# Patient Record
Sex: Female | Born: 1950 | Race: White | Hispanic: No | Marital: Single | Smoking: Current every day smoker
Health system: Southern US, Community
[De-identification: ages and names within clinical notes are randomized; demographics above are authoritative.]

## PROBLEM LIST (undated history)

## (undated) DIAGNOSIS — E785 Hyperlipidemia, unspecified: Secondary | ICD-10-CM

## (undated) DIAGNOSIS — E119 Type 2 diabetes mellitus without complications: Secondary | ICD-10-CM

## (undated) DIAGNOSIS — E0590 Thyrotoxicosis, unspecified without thyrotoxic crisis or storm: Secondary | ICD-10-CM

## (undated) DIAGNOSIS — I10 Essential (primary) hypertension: Secondary | ICD-10-CM

## (undated) DIAGNOSIS — N184 Chronic kidney disease, stage 4 (severe): Secondary | ICD-10-CM

## (undated) DIAGNOSIS — F0390 Unspecified dementia without behavioral disturbance: Secondary | ICD-10-CM

## (undated) HISTORY — DX: Type 2 diabetes mellitus without complications: E11.9

## (undated) HISTORY — DX: Hyperlipidemia, unspecified: E78.5

## (undated) HISTORY — DX: Essential (primary) hypertension: I10

## (undated) HISTORY — PX: OTHER SURGICAL HISTORY: SHX169

## (undated) HISTORY — DX: Thyrotoxicosis, unspecified without thyrotoxic crisis or storm: E05.90

---

## 2011-06-08 DIAGNOSIS — B079 Viral wart, unspecified: Secondary | ICD-10-CM | POA: Insufficient documentation

## 2011-06-08 DIAGNOSIS — E119 Type 2 diabetes mellitus without complications: Secondary | ICD-10-CM | POA: Insufficient documentation

## 2012-05-30 ENCOUNTER — Other Ambulatory Visit: Payer: Self-pay | Admitting: Endocrinology

## 2012-05-30 DIAGNOSIS — E041 Nontoxic single thyroid nodule: Secondary | ICD-10-CM

## 2012-06-03 ENCOUNTER — Other Ambulatory Visit: Payer: Self-pay | Admitting: Endocrinology

## 2012-06-03 DIAGNOSIS — E042 Nontoxic multinodular goiter: Secondary | ICD-10-CM

## 2012-06-03 DIAGNOSIS — E041 Nontoxic single thyroid nodule: Secondary | ICD-10-CM

## 2012-06-10 ENCOUNTER — Other Ambulatory Visit: Payer: Self-pay

## 2012-06-12 ENCOUNTER — Ambulatory Visit
Admission: RE | Admit: 2012-06-12 | Discharge: 2012-06-12 | Disposition: A | Discharge status: Home / Self Care | Payer: 59 | Source: Ambulatory Visit | Attending: Endocrinology | Admitting: Endocrinology

## 2012-06-12 ENCOUNTER — Other Ambulatory Visit (HOSPITAL_COMMUNITY)
Admission: RE | Admit: 2012-06-12 | Discharge: 2012-06-12 | Disposition: A | Discharge status: Home / Self Care | Payer: 59 | Source: Ambulatory Visit | Attending: Interventional Radiology | Admitting: Interventional Radiology

## 2012-06-12 DIAGNOSIS — E041 Nontoxic single thyroid nodule: Secondary | ICD-10-CM

## 2012-06-12 DIAGNOSIS — E042 Nontoxic multinodular goiter: Secondary | ICD-10-CM

## 2012-06-12 DIAGNOSIS — E049 Nontoxic goiter, unspecified: Secondary | ICD-10-CM | POA: Insufficient documentation

## 2014-02-16 DIAGNOSIS — F418 Other specified anxiety disorders: Secondary | ICD-10-CM | POA: Insufficient documentation

## 2014-04-21 ENCOUNTER — Encounter (HOSPITAL_BASED_OUTPATIENT_CLINIC_OR_DEPARTMENT_OTHER): Payer: 59 | Attending: General Surgery

## 2014-04-21 DIAGNOSIS — T22019A Burn of unspecified degree of unspecified forearm, initial encounter: Secondary | ICD-10-CM | POA: Insufficient documentation

## 2014-04-21 DIAGNOSIS — IMO0002 Reserved for concepts with insufficient information to code with codable children: Secondary | ICD-10-CM | POA: Insufficient documentation

## 2014-05-07 DIAGNOSIS — T22019A Burn of unspecified degree of unspecified forearm, initial encounter: Secondary | ICD-10-CM | POA: Diagnosis not present

## 2014-07-29 DIAGNOSIS — E119 Type 2 diabetes mellitus without complications: Secondary | ICD-10-CM | POA: Diagnosis not present

## 2014-07-29 DIAGNOSIS — H6002 Abscess of left external ear: Secondary | ICD-10-CM | POA: Diagnosis not present

## 2014-09-22 DIAGNOSIS — E118 Type 2 diabetes mellitus with unspecified complications: Secondary | ICD-10-CM | POA: Diagnosis not present

## 2014-09-24 DIAGNOSIS — E118 Type 2 diabetes mellitus with unspecified complications: Secondary | ICD-10-CM | POA: Diagnosis not present

## 2014-09-24 DIAGNOSIS — Z23 Encounter for immunization: Secondary | ICD-10-CM | POA: Diagnosis not present

## 2014-09-24 DIAGNOSIS — E789 Disorder of lipoprotein metabolism, unspecified: Secondary | ICD-10-CM | POA: Diagnosis not present

## 2014-09-24 DIAGNOSIS — E032 Hypothyroidism due to medicaments and other exogenous substances: Secondary | ICD-10-CM | POA: Diagnosis not present

## 2014-11-16 DIAGNOSIS — E118 Type 2 diabetes mellitus with unspecified complications: Secondary | ICD-10-CM | POA: Diagnosis not present

## 2014-11-25 DIAGNOSIS — E789 Disorder of lipoprotein metabolism, unspecified: Secondary | ICD-10-CM | POA: Diagnosis not present

## 2014-11-25 DIAGNOSIS — E118 Type 2 diabetes mellitus with unspecified complications: Secondary | ICD-10-CM | POA: Diagnosis not present

## 2014-11-25 DIAGNOSIS — E032 Hypothyroidism due to medicaments and other exogenous substances: Secondary | ICD-10-CM | POA: Diagnosis not present

## 2014-11-25 DIAGNOSIS — J019 Acute sinusitis, unspecified: Secondary | ICD-10-CM | POA: Diagnosis not present

## 2014-12-28 DIAGNOSIS — I10 Essential (primary) hypertension: Secondary | ICD-10-CM | POA: Diagnosis not present

## 2014-12-28 DIAGNOSIS — E119 Type 2 diabetes mellitus without complications: Secondary | ICD-10-CM | POA: Diagnosis not present

## 2014-12-28 DIAGNOSIS — S199XXA Unspecified injury of neck, initial encounter: Secondary | ICD-10-CM | POA: Diagnosis not present

## 2014-12-28 DIAGNOSIS — R312 Other microscopic hematuria: Secondary | ICD-10-CM | POA: Diagnosis not present

## 2014-12-28 DIAGNOSIS — S20211A Contusion of right front wall of thorax, initial encounter: Secondary | ICD-10-CM | POA: Diagnosis not present

## 2014-12-28 DIAGNOSIS — E785 Hyperlipidemia, unspecified: Secondary | ICD-10-CM | POA: Diagnosis not present

## 2014-12-28 DIAGNOSIS — S2020XA Contusion of thorax, unspecified, initial encounter: Secondary | ICD-10-CM | POA: Diagnosis not present

## 2014-12-28 DIAGNOSIS — M542 Cervicalgia: Secondary | ICD-10-CM | POA: Diagnosis not present

## 2015-06-09 DIAGNOSIS — E118 Type 2 diabetes mellitus with unspecified complications: Secondary | ICD-10-CM | POA: Diagnosis not present

## 2015-06-09 DIAGNOSIS — E789 Disorder of lipoprotein metabolism, unspecified: Secondary | ICD-10-CM | POA: Diagnosis not present

## 2015-06-16 DIAGNOSIS — E789 Disorder of lipoprotein metabolism, unspecified: Secondary | ICD-10-CM | POA: Diagnosis not present

## 2015-06-16 DIAGNOSIS — E118 Type 2 diabetes mellitus with unspecified complications: Secondary | ICD-10-CM | POA: Diagnosis not present

## 2015-06-16 DIAGNOSIS — E032 Hypothyroidism due to medicaments and other exogenous substances: Secondary | ICD-10-CM | POA: Diagnosis not present

## 2015-08-26 DIAGNOSIS — E032 Hypothyroidism due to medicaments and other exogenous substances: Secondary | ICD-10-CM | POA: Diagnosis not present

## 2015-08-26 DIAGNOSIS — E118 Type 2 diabetes mellitus with unspecified complications: Secondary | ICD-10-CM | POA: Diagnosis not present

## 2015-08-26 DIAGNOSIS — E789 Disorder of lipoprotein metabolism, unspecified: Secondary | ICD-10-CM | POA: Diagnosis not present

## 2015-09-02 DIAGNOSIS — Z23 Encounter for immunization: Secondary | ICD-10-CM | POA: Diagnosis not present

## 2015-09-02 DIAGNOSIS — I10 Essential (primary) hypertension: Secondary | ICD-10-CM | POA: Diagnosis not present

## 2015-09-02 DIAGNOSIS — E118 Type 2 diabetes mellitus with unspecified complications: Secondary | ICD-10-CM | POA: Diagnosis not present

## 2015-09-02 DIAGNOSIS — E0789 Other specified disorders of thyroid: Secondary | ICD-10-CM | POA: Diagnosis not present

## 2015-10-14 DIAGNOSIS — E118 Type 2 diabetes mellitus with unspecified complications: Secondary | ICD-10-CM | POA: Diagnosis not present

## 2015-10-14 DIAGNOSIS — E032 Hypothyroidism due to medicaments and other exogenous substances: Secondary | ICD-10-CM | POA: Diagnosis not present

## 2015-10-21 DIAGNOSIS — G4700 Insomnia, unspecified: Secondary | ICD-10-CM | POA: Diagnosis not present

## 2015-10-21 DIAGNOSIS — E118 Type 2 diabetes mellitus with unspecified complications: Secondary | ICD-10-CM | POA: Diagnosis not present

## 2015-10-21 DIAGNOSIS — E0590 Thyrotoxicosis, unspecified without thyrotoxic crisis or storm: Secondary | ICD-10-CM | POA: Diagnosis not present

## 2015-11-24 DIAGNOSIS — R05 Cough: Secondary | ICD-10-CM | POA: Diagnosis not present

## 2015-11-24 DIAGNOSIS — J069 Acute upper respiratory infection, unspecified: Secondary | ICD-10-CM | POA: Diagnosis not present

## 2015-12-27 DIAGNOSIS — E789 Disorder of lipoprotein metabolism, unspecified: Secondary | ICD-10-CM | POA: Diagnosis not present

## 2015-12-27 DIAGNOSIS — E0500 Thyrotoxicosis with diffuse goiter without thyrotoxic crisis or storm: Secondary | ICD-10-CM | POA: Diagnosis not present

## 2015-12-27 DIAGNOSIS — E118 Type 2 diabetes mellitus with unspecified complications: Secondary | ICD-10-CM | POA: Diagnosis not present

## 2016-02-04 DIAGNOSIS — E109 Type 1 diabetes mellitus without complications: Secondary | ICD-10-CM | POA: Diagnosis not present

## 2016-02-04 DIAGNOSIS — J209 Acute bronchitis, unspecified: Secondary | ICD-10-CM | POA: Diagnosis not present

## 2016-02-04 DIAGNOSIS — R5383 Other fatigue: Secondary | ICD-10-CM | POA: Diagnosis not present

## 2016-02-04 DIAGNOSIS — R05 Cough: Secondary | ICD-10-CM | POA: Diagnosis not present

## 2016-02-11 DIAGNOSIS — J019 Acute sinusitis, unspecified: Secondary | ICD-10-CM | POA: Diagnosis not present

## 2016-02-11 DIAGNOSIS — E109 Type 1 diabetes mellitus without complications: Secondary | ICD-10-CM | POA: Diagnosis not present

## 2016-02-11 DIAGNOSIS — J9801 Acute bronchospasm: Secondary | ICD-10-CM | POA: Diagnosis not present

## 2016-02-11 DIAGNOSIS — J209 Acute bronchitis, unspecified: Secondary | ICD-10-CM | POA: Diagnosis not present

## 2016-03-26 DIAGNOSIS — E0500 Thyrotoxicosis with diffuse goiter without thyrotoxic crisis or storm: Secondary | ICD-10-CM | POA: Diagnosis not present

## 2016-03-26 DIAGNOSIS — E032 Hypothyroidism due to medicaments and other exogenous substances: Secondary | ICD-10-CM | POA: Diagnosis not present

## 2016-03-26 DIAGNOSIS — E118 Type 2 diabetes mellitus with unspecified complications: Secondary | ICD-10-CM | POA: Diagnosis not present

## 2016-03-26 DIAGNOSIS — E789 Disorder of lipoprotein metabolism, unspecified: Secondary | ICD-10-CM | POA: Diagnosis not present

## 2016-03-30 DIAGNOSIS — I10 Essential (primary) hypertension: Secondary | ICD-10-CM | POA: Diagnosis not present

## 2016-03-30 DIAGNOSIS — E118 Type 2 diabetes mellitus with unspecified complications: Secondary | ICD-10-CM | POA: Diagnosis not present

## 2016-03-30 DIAGNOSIS — M810 Age-related osteoporosis without current pathological fracture: Secondary | ICD-10-CM | POA: Diagnosis not present

## 2016-03-30 DIAGNOSIS — E041 Nontoxic single thyroid nodule: Secondary | ICD-10-CM | POA: Diagnosis not present

## 2016-04-04 ENCOUNTER — Other Ambulatory Visit (HOSPITAL_COMMUNITY): Payer: Self-pay | Admitting: Endocrinology

## 2016-04-04 DIAGNOSIS — E041 Nontoxic single thyroid nodule: Secondary | ICD-10-CM

## 2016-04-16 ENCOUNTER — Encounter (HOSPITAL_COMMUNITY)
Admission: RE | Admit: 2016-04-16 | Discharge: 2016-04-16 | Disposition: A | Discharge status: Home / Self Care | Payer: Medicare Other | Source: Ambulatory Visit | Attending: Endocrinology | Admitting: Endocrinology

## 2016-04-16 ENCOUNTER — Encounter (HOSPITAL_COMMUNITY): Payer: 59

## 2016-04-16 ENCOUNTER — Encounter (HOSPITAL_COMMUNITY): Admission: RE | Admit: 2016-04-16 | Payer: 59 | Source: Ambulatory Visit

## 2016-04-16 DIAGNOSIS — E041 Nontoxic single thyroid nodule: Secondary | ICD-10-CM

## 2016-04-16 DIAGNOSIS — E0590 Thyrotoxicosis, unspecified without thyrotoxic crisis or storm: Secondary | ICD-10-CM | POA: Insufficient documentation

## 2016-04-16 MED ORDER — SODIUM IODIDE I 131 CAPSULE
11.4000 | Freq: Once | INTRAVENOUS | Status: AC | PRN
  Administered 2016-04-16: 11.4 via ORAL

## 2016-04-17 ENCOUNTER — Encounter (HOSPITAL_COMMUNITY): Payer: 59

## 2016-04-17 ENCOUNTER — Encounter (HOSPITAL_COMMUNITY)
Admission: RE | Admit: 2016-04-17 | Discharge: 2016-04-17 | Disposition: A | Discharge status: Home / Self Care | Payer: Medicare Other | Source: Ambulatory Visit | Attending: Endocrinology | Admitting: Endocrinology

## 2016-04-17 DIAGNOSIS — E0590 Thyrotoxicosis, unspecified without thyrotoxic crisis or storm: Secondary | ICD-10-CM | POA: Diagnosis not present

## 2016-04-17 DIAGNOSIS — E041 Nontoxic single thyroid nodule: Secondary | ICD-10-CM | POA: Diagnosis not present

## 2016-04-17 MED ORDER — SODIUM PERTECHNETATE TC 99M INJECTION
10.0000 | Freq: Once | INTRAVENOUS | Status: AC | PRN
  Administered 2016-04-17: 10 via INTRAVENOUS

## 2016-05-17 DIAGNOSIS — I10 Essential (primary) hypertension: Secondary | ICD-10-CM | POA: Diagnosis not present

## 2016-05-17 DIAGNOSIS — E032 Hypothyroidism due to medicaments and other exogenous substances: Secondary | ICD-10-CM | POA: Diagnosis not present

## 2016-05-17 DIAGNOSIS — E118 Type 2 diabetes mellitus with unspecified complications: Secondary | ICD-10-CM | POA: Diagnosis not present

## 2016-05-22 ENCOUNTER — Other Ambulatory Visit: Payer: Self-pay

## 2016-05-24 DIAGNOSIS — E041 Nontoxic single thyroid nodule: Secondary | ICD-10-CM | POA: Diagnosis not present

## 2016-05-24 DIAGNOSIS — E0590 Thyrotoxicosis, unspecified without thyrotoxic crisis or storm: Secondary | ICD-10-CM | POA: Diagnosis not present

## 2016-05-24 DIAGNOSIS — E118 Type 2 diabetes mellitus with unspecified complications: Secondary | ICD-10-CM | POA: Diagnosis not present

## 2016-06-29 DIAGNOSIS — E0500 Thyrotoxicosis with diffuse goiter without thyrotoxic crisis or storm: Secondary | ICD-10-CM | POA: Diagnosis not present

## 2016-06-29 DIAGNOSIS — E118 Type 2 diabetes mellitus with unspecified complications: Secondary | ICD-10-CM | POA: Diagnosis not present

## 2016-06-29 DIAGNOSIS — E789 Disorder of lipoprotein metabolism, unspecified: Secondary | ICD-10-CM | POA: Diagnosis not present

## 2016-06-29 DIAGNOSIS — E032 Hypothyroidism due to medicaments and other exogenous substances: Secondary | ICD-10-CM | POA: Diagnosis not present

## 2016-07-05 DIAGNOSIS — E0590 Thyrotoxicosis, unspecified without thyrotoxic crisis or storm: Secondary | ICD-10-CM | POA: Diagnosis not present

## 2016-07-05 DIAGNOSIS — E789 Disorder of lipoprotein metabolism, unspecified: Secondary | ICD-10-CM | POA: Diagnosis not present

## 2016-07-05 DIAGNOSIS — E118 Type 2 diabetes mellitus with unspecified complications: Secondary | ICD-10-CM | POA: Diagnosis not present

## 2016-07-19 DIAGNOSIS — Z23 Encounter for immunization: Secondary | ICD-10-CM | POA: Diagnosis not present

## 2016-09-28 DIAGNOSIS — Z794 Long term (current) use of insulin: Secondary | ICD-10-CM | POA: Diagnosis not present

## 2016-09-28 DIAGNOSIS — J00 Acute nasopharyngitis [common cold]: Secondary | ICD-10-CM | POA: Diagnosis not present

## 2016-09-28 DIAGNOSIS — R05 Cough: Secondary | ICD-10-CM | POA: Diagnosis not present

## 2016-09-28 DIAGNOSIS — I10 Essential (primary) hypertension: Secondary | ICD-10-CM | POA: Insufficient documentation

## 2016-09-28 DIAGNOSIS — E119 Type 2 diabetes mellitus without complications: Secondary | ICD-10-CM | POA: Insufficient documentation

## 2016-12-27 DIAGNOSIS — E0500 Thyrotoxicosis with diffuse goiter without thyrotoxic crisis or storm: Secondary | ICD-10-CM | POA: Diagnosis not present

## 2016-12-27 DIAGNOSIS — E789 Disorder of lipoprotein metabolism, unspecified: Secondary | ICD-10-CM | POA: Diagnosis not present

## 2016-12-27 DIAGNOSIS — E118 Type 2 diabetes mellitus with unspecified complications: Secondary | ICD-10-CM | POA: Diagnosis not present

## 2016-12-27 DIAGNOSIS — E032 Hypothyroidism due to medicaments and other exogenous substances: Secondary | ICD-10-CM | POA: Diagnosis not present

## 2017-01-03 ENCOUNTER — Other Ambulatory Visit: Payer: Self-pay | Admitting: Endocrinology

## 2017-01-03 DIAGNOSIS — R7989 Other specified abnormal findings of blood chemistry: Secondary | ICD-10-CM

## 2017-01-03 DIAGNOSIS — E789 Disorder of lipoprotein metabolism, unspecified: Secondary | ICD-10-CM | POA: Diagnosis not present

## 2017-01-03 DIAGNOSIS — E0500 Thyrotoxicosis with diffuse goiter without thyrotoxic crisis or storm: Secondary | ICD-10-CM | POA: Diagnosis not present

## 2017-01-03 DIAGNOSIS — E109 Type 1 diabetes mellitus without complications: Secondary | ICD-10-CM | POA: Diagnosis not present

## 2017-01-03 DIAGNOSIS — R945 Abnormal results of liver function studies: Secondary | ICD-10-CM

## 2017-01-10 ENCOUNTER — Other Ambulatory Visit: Payer: 59

## 2017-01-17 ENCOUNTER — Other Ambulatory Visit: Payer: 59

## 2017-01-18 ENCOUNTER — Ambulatory Visit
Admission: RE | Admit: 2017-01-18 | Discharge: 2017-01-18 | Disposition: A | Discharge status: Home / Self Care | Payer: Medicare Other | Source: Ambulatory Visit | Attending: Endocrinology | Admitting: Endocrinology

## 2017-01-18 DIAGNOSIS — R945 Abnormal results of liver function studies: Secondary | ICD-10-CM

## 2017-01-18 DIAGNOSIS — R7989 Other specified abnormal findings of blood chemistry: Secondary | ICD-10-CM

## 2017-02-28 DIAGNOSIS — E118 Type 2 diabetes mellitus with unspecified complications: Secondary | ICD-10-CM | POA: Diagnosis not present

## 2017-02-28 DIAGNOSIS — E032 Hypothyroidism due to medicaments and other exogenous substances: Secondary | ICD-10-CM | POA: Diagnosis not present

## 2017-02-28 DIAGNOSIS — I10 Essential (primary) hypertension: Secondary | ICD-10-CM | POA: Diagnosis not present

## 2017-03-07 DIAGNOSIS — E0590 Thyrotoxicosis, unspecified without thyrotoxic crisis or storm: Secondary | ICD-10-CM | POA: Diagnosis not present

## 2017-03-07 DIAGNOSIS — E789 Disorder of lipoprotein metabolism, unspecified: Secondary | ICD-10-CM | POA: Diagnosis not present

## 2017-03-07 DIAGNOSIS — I10 Essential (primary) hypertension: Secondary | ICD-10-CM | POA: Diagnosis not present

## 2017-03-07 DIAGNOSIS — E118 Type 2 diabetes mellitus with unspecified complications: Secondary | ICD-10-CM | POA: Diagnosis not present

## 2017-06-10 DIAGNOSIS — Z0131 Encounter for examination of blood pressure with abnormal findings: Secondary | ICD-10-CM | POA: Diagnosis not present

## 2017-06-10 DIAGNOSIS — J209 Acute bronchitis, unspecified: Secondary | ICD-10-CM | POA: Diagnosis not present

## 2017-06-10 DIAGNOSIS — J019 Acute sinusitis, unspecified: Secondary | ICD-10-CM | POA: Diagnosis not present

## 2017-07-19 DIAGNOSIS — E785 Hyperlipidemia, unspecified: Secondary | ICD-10-CM | POA: Diagnosis not present

## 2017-07-19 DIAGNOSIS — I10 Essential (primary) hypertension: Secondary | ICD-10-CM | POA: Diagnosis not present

## 2017-07-19 DIAGNOSIS — E118 Type 2 diabetes mellitus with unspecified complications: Secondary | ICD-10-CM | POA: Diagnosis not present

## 2017-08-28 DIAGNOSIS — E118 Type 2 diabetes mellitus with unspecified complications: Secondary | ICD-10-CM | POA: Diagnosis not present

## 2017-08-28 DIAGNOSIS — E785 Hyperlipidemia, unspecified: Secondary | ICD-10-CM | POA: Diagnosis not present

## 2017-10-03 DIAGNOSIS — N390 Urinary tract infection, site not specified: Secondary | ICD-10-CM | POA: Diagnosis not present

## 2017-10-03 DIAGNOSIS — E049 Nontoxic goiter, unspecified: Secondary | ICD-10-CM | POA: Diagnosis not present

## 2017-10-03 DIAGNOSIS — E559 Vitamin D deficiency, unspecified: Secondary | ICD-10-CM | POA: Diagnosis not present

## 2017-10-03 DIAGNOSIS — E0590 Thyrotoxicosis, unspecified without thyrotoxic crisis or storm: Secondary | ICD-10-CM | POA: Diagnosis not present

## 2017-10-03 DIAGNOSIS — R292 Abnormal reflex: Secondary | ICD-10-CM | POA: Diagnosis not present

## 2017-10-03 DIAGNOSIS — E118 Type 2 diabetes mellitus with unspecified complications: Secondary | ICD-10-CM | POA: Diagnosis not present

## 2017-10-03 DIAGNOSIS — F17210 Nicotine dependence, cigarettes, uncomplicated: Secondary | ICD-10-CM | POA: Diagnosis not present

## 2017-10-03 DIAGNOSIS — E032 Hypothyroidism due to medicaments and other exogenous substances: Secondary | ICD-10-CM | POA: Diagnosis not present

## 2017-10-03 DIAGNOSIS — Z23 Encounter for immunization: Secondary | ICD-10-CM | POA: Diagnosis not present

## 2017-10-03 DIAGNOSIS — Z801 Family history of malignant neoplasm of trachea, bronchus and lung: Secondary | ICD-10-CM | POA: Diagnosis not present

## 2017-10-03 DIAGNOSIS — M810 Age-related osteoporosis without current pathological fracture: Secondary | ICD-10-CM | POA: Diagnosis not present

## 2017-10-03 DIAGNOSIS — Z1211 Encounter for screening for malignant neoplasm of colon: Secondary | ICD-10-CM | POA: Diagnosis not present

## 2017-10-03 DIAGNOSIS — I8393 Asymptomatic varicose veins of bilateral lower extremities: Secondary | ICD-10-CM | POA: Diagnosis not present

## 2017-10-03 DIAGNOSIS — I4891 Unspecified atrial fibrillation: Secondary | ICD-10-CM | POA: Diagnosis not present

## 2017-10-03 DIAGNOSIS — I10 Essential (primary) hypertension: Secondary | ICD-10-CM | POA: Diagnosis not present

## 2017-10-08 ENCOUNTER — Telehealth: Payer: Self-pay | Admitting: *Deleted

## 2017-10-08 DIAGNOSIS — E118 Type 2 diabetes mellitus with unspecified complications: Secondary | ICD-10-CM | POA: Diagnosis not present

## 2017-10-08 DIAGNOSIS — I10 Essential (primary) hypertension: Secondary | ICD-10-CM | POA: Diagnosis not present

## 2017-10-08 DIAGNOSIS — Z122 Encounter for screening for malignant neoplasm of respiratory organs: Secondary | ICD-10-CM

## 2017-10-08 DIAGNOSIS — E785 Hyperlipidemia, unspecified: Secondary | ICD-10-CM | POA: Diagnosis not present

## 2017-10-08 DIAGNOSIS — M810 Age-related osteoporosis without current pathological fracture: Secondary | ICD-10-CM | POA: Diagnosis not present

## 2017-10-08 DIAGNOSIS — F17210 Nicotine dependence, cigarettes, uncomplicated: Secondary | ICD-10-CM

## 2017-10-08 DIAGNOSIS — I4891 Unspecified atrial fibrillation: Secondary | ICD-10-CM | POA: Diagnosis not present

## 2017-10-10 NOTE — Telephone Encounter (Signed)
Spoke with pt and scheduled SDMV 10/16/17 10:30 CT ordered Nothing further needed

## 2017-10-16 ENCOUNTER — Encounter: Payer: Self-pay | Admitting: Acute Care

## 2017-10-16 ENCOUNTER — Telehealth: Payer: Self-pay | Admitting: Acute Care

## 2017-10-16 ENCOUNTER — Ambulatory Visit (INDEPENDENT_AMBULATORY_CARE_PROVIDER_SITE_OTHER)
Admission: RE | Admit: 2017-10-16 | Discharge: 2017-10-16 | Disposition: A | Discharge status: Home / Self Care | Payer: Medicare Other | Source: Ambulatory Visit | Attending: Acute Care | Admitting: Acute Care

## 2017-10-16 ENCOUNTER — Ambulatory Visit (INDEPENDENT_AMBULATORY_CARE_PROVIDER_SITE_OTHER): Payer: Medicare Other | Admitting: Acute Care

## 2017-10-16 DIAGNOSIS — F17210 Nicotine dependence, cigarettes, uncomplicated: Secondary | ICD-10-CM

## 2017-10-16 DIAGNOSIS — Z122 Encounter for screening for malignant neoplasm of respiratory organs: Secondary | ICD-10-CM

## 2017-10-16 NOTE — Telephone Encounter (Signed)
Received call report from [facility] Radiology on pt's LDCT done today 10/16/17   Impression copied below:  IMPRESSION: 1. Lung-RADS 4AS, suspicious. Follow up low-dose chest CT without contrast in 3 months (please use the following order, "CT CHEST LCS NODULE FOLLOW-UP W/O CM") is recommended. Alternatively, PET may be considered when there is a solid component 37mm or larger. 2. The "S" modifier above refers to potentially clinically significant non lung cancer related findings. Specifically, there is a aortic atherosclerosis, in addition to left main and 2 vessel coronary artery disease. Please note that although the presence of coronary artery calcium documents the presence of coronary artery disease, the severity of this disease and any potential stenosis cannot be assessed on this non-gated CT examination. Assessment for potential risk factor modification, dietary therapy or pharmacologic therapy may be warranted, if clinically indicated. 3. Dilatation of the pulmonic trunk (3.9 cm in diameter), concerning for pulmonary arterial hypertension. 4. Mild diffuse bronchial wall thickening with mild centrilobular and paraseptal emphysema; imaging findings suggestive of underlying COPD.   Judson Roch is aware Phone note routed to [facility]

## 2017-10-16 NOTE — Progress Notes (Signed)
Shared Decision Making Visit Lung Cancer Screening Program [phone])   Eligibility:  Age 67 y.o.  Pack Years Smoking History Calculation 33 pack year history (# packs/per year x # years smoked)  Recent History of coughing up blood  no  Unexplained weight loss? no ( >Than 15 pounds within the last 6 months )  Prior History Lung / other cancer no (Diagnosis within the last 5 years already requiring surveillance chest CT Scans).  Smoking Status Current Smoker  Former Smokers: Years since quit:NA  Quit Date: NA  Visit Components:  Discussion included one or more decision making aids. yes  Discussion included risk/benefits of screening. yes  Discussion included potential follow up diagnostic testing for abnormal scans. yes  Discussion included meaning and risk of over diagnosis. yes  Discussion included meaning and risk of False Positives. yes  Discussion included meaning of total radiation exposure. yes  Counseling Included:  Importance of adherence to annual lung cancer LDCT screening. yes  Impact of comorbidities on ability to participate in the program. yes  Ability and willingness to under diagnostic treatment. yes  Smoking Cessation Counseling:  Current Smokers:   Discussed importance of smoking cessation. yes  Information about tobacco cessation classes and interventions provided to patient. yes  Patient provided with "ticket" for LDCT Scan. yes  Symptomatic Patient. no  Counseling  Diagnosis Code: Tobacco Use Z72.0  Asymptomatic Patient yes  Counseling (Intermediate counseling: > three minutes counseling) S1115  Former Smokers:   Discussed the importance of maintaining cigarette abstinence. yes  Diagnosis Code: Personal History of Nicotine Dependence. Z20.802  Information about tobacco cessation classes and interventions provided to patient. Yes  Patient provided with "ticket" for LDCT Scan. yes  Written Order for Lung Cancer Screening with  LDCT placed in Epic. Yes (CT Chest Lung Cancer Screening Low Dose W/O CM) [id number] Z12.2-Screening of respiratory organs Z87.891-Personal history of nicotine dependence  I have spent 25 minutes of face to face time with Ms. Babin discussing the risks and benefits of lung cancer screening. We viewed a power point together that explained in detail the above noted topics. We paused at intervals to allow for questions to be asked and answered to ensure understanding.We discussed that the single most powerful action that she can take to decrease her risk of developing lung cancer is to quit smoking. We discussed whether or not she is ready to commit to setting a quit date. She is not ready to set a quit date. She does have a prescription for Chantix. We discussed options for tools to aid in quitting smoking including nicotine replacement therapy, non-nicotine medications, support groups, Quit Smart classes, and behavior modification. We discussed that often times setting smaller, more achievable goals, such as eliminating 1 cigarette a day for a week and then 2 cigarettes a day for a week can be helpful in slowly decreasing the number of cigarettes smoked. This allows for a sense of accomplishment as well as providing a clinical benefit. I gave her the " Be Stronger Than Your Excuses" card with contact information for community resources, classes, free nicotine replacement therapy, and access to mobile apps, text messaging, and on-line smoking cessation help. I have also given her my card and contact information in the event she needs to contact me. We discussed the time and location of the scan, and that either Doroteo Glassman RN or I will call with the results within 24-48 hours of receiving them. I have offered her  a copy of the power  point we viewed  as a resource in the event they need reinforcement of the concepts we discussed today in the office. The patient verbalized understanding of all of  the above and  had no further questions upon leaving the office. They have my contact information in the event they have any further questions.  I spent 3-4  minutes counseling on smoking cessation and the health risks of continued tobacco abuse.  I explained to the patient that there has been a high incidence of coronary artery disease noted on these exams. I explained that this is a non-gated exam therefore degree or severity cannot be determined. This patient is on statin therapy. I have asked the patient to follow-up with their PCP regarding any incidental finding of coronary artery disease and management with diet or medication as their PCP  feels is clinically indicated. The patient verbalized understanding of the above and had no further questions upon completion of the visit.      Magdalen Spatz, NP 10/16/2017

## 2017-10-18 NOTE — Telephone Encounter (Signed)
I have called the patient with the results of her LDCT scan. I have explained that her scan was read as a Lung RADS 4 A : suspicious findings, either short term follow up in 3 months or alternatively  PET Scan evaluation may be considered when there is a solid component of  8 mm or larger. I explained that we will do a follow up scan in 3 months ( The end of April 2019). She verbalized understanding. Additionally I have told her we noted what may be pulmonary HTN on the scan. I explained  That we will do a referral to pulmonary for this. She is in agreement.  Langley Gauss, Please place order for follow up CT ( follow up of a 4A) and refer to [facility] for consult for pulmonary HTN. Thanks so much.

## 2017-10-21 DIAGNOSIS — Z1212 Encounter for screening for malignant neoplasm of rectum: Secondary | ICD-10-CM | POA: Diagnosis not present

## 2017-10-21 DIAGNOSIS — Z1211 Encounter for screening for malignant neoplasm of colon: Secondary | ICD-10-CM | POA: Diagnosis not present

## 2017-10-22 NOTE — Telephone Encounter (Signed)
Spoke with pt and scheduled consult with Dr Lake Bells 12/12/17 9:00.  Order placed for 3 mth f/u Chest CT LCS.  Pt verbalized understanding.  Nothing further needed.

## 2017-10-23 DIAGNOSIS — R600 Localized edema: Secondary | ICD-10-CM | POA: Diagnosis not present

## 2017-10-23 DIAGNOSIS — E119 Type 2 diabetes mellitus without complications: Secondary | ICD-10-CM | POA: Diagnosis not present

## 2017-10-23 DIAGNOSIS — Z720 Tobacco use: Secondary | ICD-10-CM | POA: Diagnosis not present

## 2017-10-23 DIAGNOSIS — I481 Persistent atrial fibrillation: Secondary | ICD-10-CM | POA: Diagnosis not present

## 2017-10-23 DIAGNOSIS — Z794 Long term (current) use of insulin: Secondary | ICD-10-CM | POA: Diagnosis not present

## 2017-10-29 DIAGNOSIS — M810 Age-related osteoporosis without current pathological fracture: Secondary | ICD-10-CM | POA: Diagnosis not present

## 2017-10-29 DIAGNOSIS — H35033 Hypertensive retinopathy, bilateral: Secondary | ICD-10-CM | POA: Diagnosis not present

## 2017-10-29 DIAGNOSIS — H25013 Cortical age-related cataract, bilateral: Secondary | ICD-10-CM | POA: Diagnosis not present

## 2017-10-29 DIAGNOSIS — E119 Type 2 diabetes mellitus without complications: Secondary | ICD-10-CM | POA: Diagnosis not present

## 2017-10-29 DIAGNOSIS — E118 Type 2 diabetes mellitus with unspecified complications: Secondary | ICD-10-CM | POA: Diagnosis not present

## 2017-10-29 DIAGNOSIS — E785 Hyperlipidemia, unspecified: Secondary | ICD-10-CM | POA: Diagnosis not present

## 2017-10-29 DIAGNOSIS — I10 Essential (primary) hypertension: Secondary | ICD-10-CM | POA: Diagnosis not present

## 2017-10-29 DIAGNOSIS — H2513 Age-related nuclear cataract, bilateral: Secondary | ICD-10-CM | POA: Diagnosis not present

## 2017-10-29 DIAGNOSIS — I4891 Unspecified atrial fibrillation: Secondary | ICD-10-CM | POA: Diagnosis not present

## 2017-11-05 DIAGNOSIS — R0789 Other chest pain: Secondary | ICD-10-CM | POA: Diagnosis not present

## 2017-11-05 DIAGNOSIS — I481 Persistent atrial fibrillation: Secondary | ICD-10-CM | POA: Diagnosis not present

## 2017-11-20 DIAGNOSIS — F17210 Nicotine dependence, cigarettes, uncomplicated: Secondary | ICD-10-CM | POA: Diagnosis not present

## 2017-11-20 DIAGNOSIS — E118 Type 2 diabetes mellitus with unspecified complications: Secondary | ICD-10-CM | POA: Diagnosis not present

## 2017-11-20 DIAGNOSIS — I119 Hypertensive heart disease without heart failure: Secondary | ICD-10-CM | POA: Diagnosis not present

## 2017-11-26 DIAGNOSIS — M6281 Muscle weakness (generalized): Secondary | ICD-10-CM | POA: Diagnosis not present

## 2017-11-26 DIAGNOSIS — E0590 Thyrotoxicosis, unspecified without thyrotoxic crisis or storm: Secondary | ICD-10-CM | POA: Diagnosis not present

## 2017-11-26 DIAGNOSIS — I4891 Unspecified atrial fibrillation: Secondary | ICD-10-CM | POA: Diagnosis not present

## 2017-11-26 DIAGNOSIS — Z7902 Long term (current) use of antithrombotics/antiplatelets: Secondary | ICD-10-CM | POA: Diagnosis not present

## 2017-11-26 DIAGNOSIS — E1110 Type 2 diabetes mellitus with ketoacidosis without coma: Secondary | ICD-10-CM | POA: Diagnosis not present

## 2017-11-26 DIAGNOSIS — Z794 Long term (current) use of insulin: Secondary | ICD-10-CM | POA: Diagnosis not present

## 2017-11-26 DIAGNOSIS — E860 Dehydration: Secondary | ICD-10-CM | POA: Diagnosis not present

## 2017-11-26 DIAGNOSIS — R5381 Other malaise: Secondary | ICD-10-CM | POA: Diagnosis not present

## 2017-11-26 DIAGNOSIS — E1310 Other specified diabetes mellitus with ketoacidosis without coma: Secondary | ICD-10-CM | POA: Diagnosis not present

## 2017-11-26 DIAGNOSIS — Z79899 Other long term (current) drug therapy: Secondary | ICD-10-CM | POA: Diagnosis not present

## 2017-11-27 ENCOUNTER — Other Ambulatory Visit (HOSPITAL_COMMUNITY): Payer: Self-pay | Admitting: Cardiology

## 2017-11-27 DIAGNOSIS — E1310 Other specified diabetes mellitus with ketoacidosis without coma: Secondary | ICD-10-CM | POA: Diagnosis not present

## 2017-11-27 DIAGNOSIS — E860 Dehydration: Secondary | ICD-10-CM | POA: Diagnosis not present

## 2017-11-27 DIAGNOSIS — I872 Venous insufficiency (chronic) (peripheral): Secondary | ICD-10-CM

## 2017-12-03 DIAGNOSIS — E785 Hyperlipidemia, unspecified: Secondary | ICD-10-CM | POA: Diagnosis not present

## 2017-12-03 DIAGNOSIS — E139 Other specified diabetes mellitus without complications: Secondary | ICD-10-CM | POA: Diagnosis not present

## 2017-12-03 DIAGNOSIS — E0590 Thyrotoxicosis, unspecified without thyrotoxic crisis or storm: Secondary | ICD-10-CM | POA: Diagnosis not present

## 2017-12-03 DIAGNOSIS — R809 Proteinuria, unspecified: Secondary | ICD-10-CM | POA: Diagnosis not present

## 2017-12-03 DIAGNOSIS — E119 Type 2 diabetes mellitus without complications: Secondary | ICD-10-CM | POA: Diagnosis not present

## 2017-12-03 DIAGNOSIS — I10 Essential (primary) hypertension: Secondary | ICD-10-CM | POA: Diagnosis not present

## 2017-12-03 DIAGNOSIS — I4891 Unspecified atrial fibrillation: Secondary | ICD-10-CM | POA: Diagnosis not present

## 2017-12-03 DIAGNOSIS — E138 Other specified diabetes mellitus with unspecified complications: Secondary | ICD-10-CM | POA: Diagnosis not present

## 2017-12-03 DIAGNOSIS — Z6834 Body mass index (BMI) 34.0-34.9, adult: Secondary | ICD-10-CM | POA: Diagnosis not present

## 2017-12-03 DIAGNOSIS — F17210 Nicotine dependence, cigarettes, uncomplicated: Secondary | ICD-10-CM | POA: Diagnosis not present

## 2017-12-09 DIAGNOSIS — R0789 Other chest pain: Secondary | ICD-10-CM | POA: Diagnosis not present

## 2017-12-09 DIAGNOSIS — I4891 Unspecified atrial fibrillation: Secondary | ICD-10-CM | POA: Diagnosis not present

## 2017-12-12 ENCOUNTER — Ambulatory Visit (INDEPENDENT_AMBULATORY_CARE_PROVIDER_SITE_OTHER): Payer: Medicare Other | Admitting: Pulmonary Disease

## 2017-12-12 ENCOUNTER — Encounter: Payer: Self-pay | Admitting: Pulmonary Disease

## 2017-12-12 VITALS — BP 146/92 | HR 100 | Ht 66.5 in | Wt 186.4 lb

## 2017-12-12 DIAGNOSIS — R911 Solitary pulmonary nodule: Secondary | ICD-10-CM

## 2017-12-12 DIAGNOSIS — M810 Age-related osteoporosis without current pathological fracture: Secondary | ICD-10-CM | POA: Diagnosis not present

## 2017-12-12 DIAGNOSIS — F17210 Nicotine dependence, cigarettes, uncomplicated: Secondary | ICD-10-CM | POA: Diagnosis not present

## 2017-12-12 DIAGNOSIS — E139 Other specified diabetes mellitus without complications: Secondary | ICD-10-CM | POA: Diagnosis not present

## 2017-12-12 DIAGNOSIS — E785 Hyperlipidemia, unspecified: Secondary | ICD-10-CM | POA: Diagnosis not present

## 2017-12-12 DIAGNOSIS — I4891 Unspecified atrial fibrillation: Secondary | ICD-10-CM | POA: Diagnosis not present

## 2017-12-12 DIAGNOSIS — I119 Hypertensive heart disease without heart failure: Secondary | ICD-10-CM | POA: Diagnosis not present

## 2017-12-12 DIAGNOSIS — E118 Type 2 diabetes mellitus with unspecified complications: Secondary | ICD-10-CM | POA: Diagnosis not present

## 2017-12-12 DIAGNOSIS — J438 Other emphysema: Secondary | ICD-10-CM | POA: Diagnosis not present

## 2017-12-12 DIAGNOSIS — I10 Essential (primary) hypertension: Secondary | ICD-10-CM | POA: Diagnosis not present

## 2017-12-12 NOTE — Patient Instructions (Signed)
For centrilobular emphysema: Stop smoking Spirometry test today Wash her hands when out in public to prevent infection At this point I see no need for medication given your lack of symptoms If you report shortness of breath, chest tightness or wheezing then let me know and I can prescribe an inhaler  For the pulmonary nodule: Stop smoking Repeat low-dose CT scan in May  For the enlargement of pulmonary arteries on the CT scan: We will assess for COPD today  Cigarette smoking: Stop smoking Use Chantix as directed, be sure to stop the medicine if you have any abnormal thoughts or emotions.  We will see you back in May after the repeat low-dose CT scan

## 2017-12-12 NOTE — Progress Notes (Signed)
Subjective:   PATIENT ID: Carol Valenzuela GENDER: female DOB: 04/09/1951, MRN: [id number]  Synopsis: Referred in March 2019 for an abnormal CT scan of the chest that showed a left lower lobe pulmonary nodule and pulmonary vascular enlargement with mild centrilobular emphysema.[name]  HPI  Chief Complaint  Patient presents with  . Consult    Referred by SG for findings of possible pulm HTN on LDCT.      Carol Valenzuela started going to the doctor again recently and ended up having a lung cancer screening CT scan that was abnormal.  She lives in [place] in [place].  She had been caring for her mother for three years and was smoking and eatin ga lot and gained a lot of weight and felt sick so she went to Dr. Denita Lung office to establish primary care.  She has been diagnosed with diabetes and hypothroidism.   She has smoked cigarettes over the years, she started smoking in college and she says she is now "really old" and still smoking.  So it sounds like she smoked for 40 years.  She smoked mostly around the times of exams and papers when in college then later she smoked more due ot her stressful job, about 1.5 to 2ppd while working.  She is now struggling to quit and feels like her mental health is dependent on her cigarettes now.    She doesn't not any cough or report any shortness of breath.  She has had repeated episodes of severe bronchitis over the last few years which has required antibiotic treatment.  Past Medical History:  Diagnosis Date  . Diabetes ([place])   . Hyperthyroidism      Family History  Problem Relation Age of Onset  . Brain cancer Father   . Lung cancer Father   . Liver cancer Father   . Alcoholism Father      Social History   Socioeconomic History  . Marital status: Single    Spouse name: Not on file  . Number of children: Not on file  . Years of education: Not on file  . Highest education level: Not on file  Occupational History  . Not on file  Social Needs  .  Financial resource strain: Not on file  . Food insecurity:    Worry: Not on file    Inability: Not on file  . Transportation needs:    Medical: Not on file    Non-medical: Not on file  Tobacco Use  . Smoking status: Current Every Day Smoker    Packs/day: 0.75    Years: 44.00    Pack years: 33.00  . Smokeless tobacco: Never Used  . Tobacco comment: down to 0.5ppd  Substance and Sexual Activity  . Alcohol use: Not on file  . Drug use: Not on file  . Sexual activity: Not on file  Lifestyle  . Physical activity:    Days per week: Not on file    Minutes per session: Not on file  . Stress: Not on file  Relationships  . Social connections:    Talks on phone: Not on file    Gets together: Not on file    Attends religious service: Not on file    Active member of club or organization: Not on file    Attends meetings of clubs or organizations: Not on file    Relationship status: Not on file  . Intimate partner violence:    Fear of current or ex partner: Not on  file    Emotionally abused: Not on file    Physically abused: Not on file    Forced sexual activity: Not on file  Other Topics Concern  . Not on file  Social History Narrative  . Not on file     No Known Allergies   Outpatient Medications Prior to Visit  Medication Sig Dispense Refill  . amLODipine (NORVASC) 5 MG tablet Take 5 mg by mouth daily.    [name] apixaban (ELIQUIS) 5 MG TABS tablet Take 5 mg by mouth 2 (two) times daily.    [name] atorvastatin (LIPITOR) 40 MG tablet Take 40 mg by mouth daily.    . calcium-vitamin D (CALCIUM 500+D HIGH POTENCY) 500-400 MG-UNIT tablet Take 1 tablet by mouth daily.    . cholecalciferol (VITAMIN D) 400 units TABS tablet Take 400 Units by mouth daily.    [name] co-enzyme Q-10 30 MG capsule Take 30 mg by mouth daily.    . furosemide (LASIX) 40 MG tablet Take 40 mg by mouth daily.    . insulin aspart (NOVOLOG FLEXPEN) 100 UNIT/ML FlexPen Inject into the skin 3 (three) times daily with meals.    .  insulin detemir (LEVEMIR) 100 UNIT/ML injection Inject 15 Units into the skin daily.    [name] lisinopril (PRINIVIL,ZESTRIL) 40 MG tablet Take 40 mg by mouth daily.    . magnesium oxide (MAG-OX) 400 MG tablet Take 400 mg by mouth daily.    . metFORMIN (GLUCOPHAGE) 500 MG tablet Take 500 mg by mouth daily with breakfast.    . methimazole (TAPAZOLE) 5 MG tablet Take 5 mg by mouth daily.    . metoprolol succinate (TOPROL-XL) 50 MG 24 hr tablet Take 50 mg by mouth daily. Take with or immediately following a meal.    . multivitamin-iron-minerals-folic acid (CENTRUM) chewable tablet Chew 1 tablet by mouth daily.    . Omega-3 Fatty Acids (FISH OIL) 1000 MG CAPS Take 1 capsule by mouth daily.     No facility-administered medications prior to visit.     Review of Systems  Constitutional: Negative for chills, fever, malaise/fatigue and weight loss.  HENT: Negative for congestion, nosebleeds, sinus pain and sore throat.   Eyes: Negative for photophobia, pain and discharge.  Respiratory: Negative for cough, hemoptysis, sputum production, shortness of breath and wheezing.   Cardiovascular: Negative for chest pain, palpitations, orthopnea and leg swelling.  Gastrointestinal: Negative for abdominal pain, constipation, diarrhea, nausea and vomiting.  Genitourinary: Negative for dysuria, frequency, hematuria and urgency.  Musculoskeletal: Negative for back pain, joint pain, myalgias and neck pain.  Skin: Negative for itching and rash.  Neurological: Negative for tingling, tremors, sensory change, speech change, focal weakness, seizures, weakness and headaches.  Psychiatric/Behavioral: Negative for memory loss, substance abuse and suicidal ideas. The patient is not nervous/anxious.       Objective:  Physical Exam   Vitals:   12/12/17 0910  BP: (!) 146/92  Pulse: 100  SpO2: 98%  Weight: 186 lb 6.4 oz (84.6 kg)  Height: 5' 6.5" (1.689 m)   RA  Gen: well appearing, no acute distress HENT: NCAT, OP  clear, neck supple without masses Eyes: PERRL, EOMi Lymph: no cervical lymphadenopathy PULM: CTA B CV: Irreg irreg, no mgr, no JVD GI: BS+, soft, nontender, no hsm Derm: no rash or skin breakdown MSK: normal bulk and tone Neuro: A&Ox4, CN II-XII intact, strength 5/5 in all 4 extremities Psyche: normal mood and affect   CBC No results found for: WBC, RBC, HGB, HCT,  PLT, MCV, MCH, MCHC, RDW, LYMPHSABS, MONOABS, EOSABS, BASOSABS   Chest imaging: January 2019 CT chest images independently reviewed showing a 10 mm nodule in the left lower lobe, pulmonary vascular dilation, centrilobular emphysema, coronary atherosclerosis   PFT:  Labs:  Path:  Echo:  Heart Catheterization:       Assessment & Plan:   Other emphysema ([place]) - Plan: Spirometry with Graph  Cigarette smoker  Pulmonary nodule  Discussion: Carol Valenzuela comes to my clinic today for evaluation of a pulmonary nodule, centrilobular emphysema and findings worrisome for pulmonary hypertension based on a CT scan of her chest.  Objectively she has mild centrilobular emphysema, a pulmonary nodule which is of borderline size and certainly merits close follow-up given her smoking history, and some degree of pulmonary hypertension seen on the CT scan of her chest.  As she reports no symptoms of cough or shortness of breath at this time even if we discover COPD I would not treat it, further given the finding of centrilobular emphysema the pulmonary vascular enlargement is also not terribly worrisome.  Pulmonary vascular hypertension is likely due to centrilobular emphysema, and would not merit pulmonary vascular treatment based on her lack of symptoms.  The pulmonary nodule is of borderline size considering her smoking history.  It needs to be followed closely.  Today we talked for about 5 minutes about the importance of quitting smoking.  We talked about use of Chantix.  She says that though she has struggled with some mild depression  and anxiety in the past she is willing to take the medication.  She is talked to her family members and friends about potential side effects.  She understands the risk of suicidal thoughts or emotional problems while taking the medicines and understands that she would need to stop it if she had this symptom.  Plan: For centrilobular emphysema: Stop smoking Spirometry test today Wash her hands when out in public to prevent infection At this point I see no need for medication given your lack of symptoms If you report shortness of breath, chest tightness or wheezing then let me know and I can prescribe an inhaler  For the pulmonary nodule: Stop smoking Repeat low-dose CT scan in May  For the enlargement of pulmonary arteries on the CT scan: We will assess for COPD today  Cigarette smoking: Stop smoking Use Chantix as directed, be sure to stop the medicine if you have any abnormal thoughts or emotions.  We will see you back in May after the repeat low-dose CT scan    Current Outpatient Medications:  .  amLODipine (NORVASC) 5 MG tablet, Take 5 mg by mouth daily., Disp: , Rfl:  .  apixaban (ELIQUIS) 5 MG TABS tablet, Take 5 mg by mouth 2 (two) times daily., Disp: , Rfl:  .  atorvastatin (LIPITOR) 40 MG tablet, Take 40 mg by mouth daily., Disp: , Rfl:  .  calcium-vitamin D (CALCIUM 500+D HIGH POTENCY) 500-400 MG-UNIT tablet, Take 1 tablet by mouth daily., Disp: , Rfl:  .  cholecalciferol (VITAMIN D) 400 units TABS tablet, Take 400 Units by mouth daily., Disp: , Rfl:  .  co-enzyme Q-10 30 MG capsule, Take 30 mg by mouth daily., Disp: , Rfl:  .  furosemide (LASIX) 40 MG tablet, Take 40 mg by mouth daily., Disp: , Rfl:  .  insulin aspart (NOVOLOG FLEXPEN) 100 UNIT/ML FlexPen, Inject into the skin 3 (three) times daily with meals., Disp: , Rfl:  .  insulin detemir (LEVEMIR) 100  UNIT/ML injection, Inject 15 Units into the skin daily., Disp: , Rfl:  .  lisinopril (PRINIVIL,ZESTRIL) 40 MG  tablet, Take 40 mg by mouth daily., Disp: , Rfl:  .  magnesium oxide (MAG-OX) 400 MG tablet, Take 400 mg by mouth daily., Disp: , Rfl:  .  metFORMIN (GLUCOPHAGE) 500 MG tablet, Take 500 mg by mouth daily with breakfast., Disp: , Rfl:  .  methimazole (TAPAZOLE) 5 MG tablet, Take 5 mg by mouth daily., Disp: , Rfl:  .  metoprolol succinate (TOPROL-XL) 50 MG 24 hr tablet, Take 50 mg by mouth daily. Take with or immediately following a meal., Disp: , Rfl:  .  multivitamin-iron-minerals-folic acid (CENTRUM) chewable tablet, Chew 1 tablet by mouth daily., Disp: , Rfl:  .  Omega-3 Fatty Acids (FISH OIL) 1000 MG CAPS, Take 1 capsule by mouth daily., Disp: , Rfl:

## 2017-12-18 ENCOUNTER — Other Ambulatory Visit: Payer: Medicare Other

## 2017-12-18 ENCOUNTER — Ambulatory Visit
Admission: RE | Admit: 2017-12-18 | Discharge: 2017-12-18 | Disposition: A | Discharge status: Home / Self Care | Payer: Medicare Other | Source: Ambulatory Visit | Attending: Cardiology | Admitting: Cardiology

## 2017-12-18 ENCOUNTER — Other Ambulatory Visit: Payer: 59

## 2017-12-18 DIAGNOSIS — I872 Venous insufficiency (chronic) (peripheral): Secondary | ICD-10-CM | POA: Diagnosis not present

## 2017-12-18 DIAGNOSIS — I83813 Varicose veins of bilateral lower extremities with pain: Secondary | ICD-10-CM | POA: Diagnosis not present

## 2017-12-18 NOTE — Consult Note (Signed)
Chief Complaint: Patient was seen in consultation today for No chief complaint on file.  at the request of Patwardhan,Manish J  Referring Physician(s): Patwardhan,Manish J  History of Present Illness: Carol Valenzuela is a 67 y.o. female who was referred for ankle edema and spider veins. She describes spider veins for several years. She describes slight left lower extremity swelling but relates that to a prior injury. She denies any skin breakdown. She denies any family history of venous insufficiency.  Past Medical History:  Diagnosis Date  . Diabetes ([place])   . Hyperthyroidism     Past Surgical History:  Procedure Laterality Date  . ankle sx Left    broken ankle repair    Allergies: Patient has no known allergies.  Medications: Prior to Admission medications   Medication Sig Start Date End Date Taking? Authorizing Provider  amLODipine (NORVASC) 5 MG tablet Take 5 mg by mouth daily.    [provider]  apixaban (ELIQUIS) 5 MG TABS tablet Take 5 mg by mouth 2 (two) times daily.    [provider]  atorvastatin (LIPITOR) 40 MG tablet Take 40 mg by mouth daily.    [provider]  calcium-vitamin D (CALCIUM 500+D HIGH POTENCY) 500-400 MG-UNIT tablet Take 1 tablet by mouth daily.    [provider]  cholecalciferol (VITAMIN D) 400 units TABS tablet Take 400 Units by mouth daily.    [provider]  co-enzyme Q-10 30 MG capsule Take 30 mg by mouth daily.    [provider]  furosemide (LASIX) 40 MG tablet Take 40 mg by mouth daily.    [provider]  insulin aspart (NOVOLOG FLEXPEN) 100 UNIT/ML FlexPen Inject into the skin 3 (three) times daily with meals.    [provider]  insulin detemir (LEVEMIR) 100 UNIT/ML injection Inject 15 Units into the skin daily.    [provider]  lisinopril (PRINIVIL,ZESTRIL) 40 MG tablet Take 40 mg by mouth daily.    [provider]  magnesium oxide  (MAG-OX) 400 MG tablet Take 400 mg by mouth daily.    [provider]  metFORMIN (GLUCOPHAGE) 500 MG tablet Take 500 mg by mouth daily with breakfast.    [provider]  methimazole (TAPAZOLE) 5 MG tablet Take 5 mg by mouth daily.    [provider]  metoprolol succinate (TOPROL-XL) 50 MG 24 hr tablet Take 50 mg by mouth daily. Take with or immediately following a meal.    [provider]  multivitamin-iron-minerals-folic acid (CENTRUM) chewable tablet Chew 1 tablet by mouth daily.    [provider]  Omega-3 Fatty Acids (FISH OIL) 1000 MG CAPS Take 1 capsule by mouth daily.    [provider]     Family History  Problem Relation Age of Onset  . Brain cancer Father   . Lung cancer Father   . Liver cancer Father   . Alcoholism Father     Social History   Socioeconomic History  . Marital status: Single    Spouse name: Not on file  . Number of children: Not on file  . Years of education: Not on file  . Highest education level: Not on file  Occupational History  . Not on file  Social Needs  . Financial resource strain: Not on file  . Food insecurity:    Worry: Not on file    Inability: Not on file  . Transportation needs:    Medical: Not on file  Non-medical: Not on file  Tobacco Use  . Smoking status: Current Every Day Smoker    Packs/day: 0.75    Years: 44.00    Pack years: 33.00  . Smokeless tobacco: Never Used  . Tobacco comment: down to 0.5ppd  Substance and Sexual Activity  . Alcohol use: Not on file  . Drug use: Not on file  . Sexual activity: Not on file  Lifestyle  . Physical activity:    Days per week: Not on file    Minutes per session: Not on file  . Stress: Not on file  Relationships  . Social connections:    Talks on phone: Not on file    Gets together: Not on file    Attends religious service: Not on file    Active member of club or organization: Not on file    Attends meetings of clubs or  organizations: Not on file    Relationship status: Not on file  Other Topics Concern  . Not on file  Social History Narrative  . Not on file     Review of Systems: A 12 point ROS discussed and pertinent positives are indicated in the HPI above.  All other systems are negative.  Review of Systems  Vital Signs: LMP  (LMP Unknown)   Physical Exam  No evidence of lower extremity edema. Scattered spider veins in the legs are noted. Pulses are intact distally.   Imaging: US Venous Img Lower Bilateral  Result Date: 12/18/2017 CLINICAL DATA:  Spider veins EXAM: BILATERAL LOWER EXTREMITY VENOUS DUPLEX ULTRASOUND TECHNIQUE: Gray-scale sonography with graded compression, as well as color Doppler and duplex ultrasound, were performed to evaluate the deep and superficial veins of both lower extremities. Spectral Doppler was utilized to evaluate flow at rest and with distal augmentation maneuvers. A complete superficial venous insufficiency exam was performed in the upright standing. I personally performed the technical portion of the exam. COMPARISON:  None. FINDINGS: RIGHT Deep Venous System: Evaluation of the deep venous system including the common femoral, femoral, profunda femoral, popliteal and calf veins (where visible) demonstrate no evidence of deep venous thrombosis. The vessels are compressible and demonstrate normal respiratory phasicity and response to augmentation. No evidence of deep venous reflux. RIGHT Superficial Venous System: No reflux LEFT Deep Venous System: Evaluation of the deep venous system including the common femoral, femoral, profunda femoral, popliteal and calf veins (where visible) demonstrate no evidence of deep venous thrombosis. The vessels are compressible and demonstrate normal respiratory phasicity and response to augmentation. No evidence of deep venous reflux. LEFT Superficial Venous System: No reflux IMPRESSION: No evidence of DVT. No evidence of superficial venous  reflux in the lower extremities. Electronically Signed   By: Marybelle Killings M.D.   On: 12/18/2017 14:21    Labs:  CBC: No results for input(s): WBC, HGB, HCT, PLT in the last 8760 hours.  COAGS: No results for input(s): INR, APTT in the last 8760 hours.  BMP: No results for input(s): NA, K, CL, CO2, GLUCOSE, BUN, CALCIUM, CREATININE, GFRNONAA, GFRAA in the last 8760 hours.  Invalid input(s): CMP  LIVER FUNCTION TESTS: No results for input(s): BILITOT, AST, ALT, ALKPHOS, PROT, ALBUMIN in the last 8760 hours.  TUMOR MARKERS: No results for input(s): AFPTM, CEA, CA199, CHROMGRNA in the last 8760 hours.  Assessment and Plan:  There is no evidence of DVT and no evidence of venous insufficiency in the lower extremities. No treatment regarding venous insufficiency is indicated at this time.  Thank you  for this interesting consult.  I greatly enjoyed meeting Carol Valenzuela and look forward to participating in their care.  A copy of this report was sent to the requesting provider on this date.  Electronically Signed: Layah Skousen, ART A 12/18/2017, 2:24 PM   I spent a total of  40 Minutes   in face to face in clinical consultation, greater than 50% of which was counseling/coordinating care for lower extremity venous insufficiency.

## 2017-12-23 DIAGNOSIS — I10 Essential (primary) hypertension: Secondary | ICD-10-CM | POA: Diagnosis not present

## 2017-12-23 DIAGNOSIS — E119 Type 2 diabetes mellitus without complications: Secondary | ICD-10-CM | POA: Diagnosis not present

## 2017-12-23 DIAGNOSIS — Z794 Long term (current) use of insulin: Secondary | ICD-10-CM | POA: Diagnosis not present

## 2017-12-23 DIAGNOSIS — I481 Persistent atrial fibrillation: Secondary | ICD-10-CM | POA: Diagnosis not present

## 2017-12-26 DIAGNOSIS — I119 Hypertensive heart disease without heart failure: Secondary | ICD-10-CM | POA: Diagnosis not present

## 2017-12-26 DIAGNOSIS — I10 Essential (primary) hypertension: Secondary | ICD-10-CM | POA: Diagnosis not present

## 2017-12-26 DIAGNOSIS — E118 Type 2 diabetes mellitus with unspecified complications: Secondary | ICD-10-CM | POA: Diagnosis not present

## 2017-12-31 DIAGNOSIS — E118 Type 2 diabetes mellitus with unspecified complications: Secondary | ICD-10-CM | POA: Diagnosis not present

## 2017-12-31 DIAGNOSIS — I10 Essential (primary) hypertension: Secondary | ICD-10-CM | POA: Diagnosis not present

## 2017-12-31 DIAGNOSIS — E139 Other specified diabetes mellitus without complications: Secondary | ICD-10-CM | POA: Diagnosis not present

## 2017-12-31 DIAGNOSIS — R809 Proteinuria, unspecified: Secondary | ICD-10-CM | POA: Diagnosis not present

## 2017-12-31 DIAGNOSIS — E0590 Thyrotoxicosis, unspecified without thyrotoxic crisis or storm: Secondary | ICD-10-CM | POA: Diagnosis not present

## 2017-12-31 DIAGNOSIS — I4891 Unspecified atrial fibrillation: Secondary | ICD-10-CM | POA: Diagnosis not present

## 2017-12-31 DIAGNOSIS — Z6834 Body mass index (BMI) 34.0-34.9, adult: Secondary | ICD-10-CM | POA: Diagnosis not present

## 2017-12-31 DIAGNOSIS — F17210 Nicotine dependence, cigarettes, uncomplicated: Secondary | ICD-10-CM | POA: Diagnosis not present

## 2017-12-31 DIAGNOSIS — E785 Hyperlipidemia, unspecified: Secondary | ICD-10-CM | POA: Diagnosis not present

## 2018-01-15 ENCOUNTER — Ambulatory Visit: Payer: 59 | Admitting: *Deleted

## 2018-01-20 ENCOUNTER — Encounter

## 2018-01-20 DIAGNOSIS — E0590 Thyrotoxicosis, unspecified without thyrotoxic crisis or storm: Secondary | ICD-10-CM | POA: Diagnosis not present

## 2018-01-20 DIAGNOSIS — Z6834 Body mass index (BMI) 34.0-34.9, adult: Secondary | ICD-10-CM | POA: Diagnosis not present

## 2018-01-20 DIAGNOSIS — F17210 Nicotine dependence, cigarettes, uncomplicated: Secondary | ICD-10-CM | POA: Diagnosis not present

## 2018-01-20 DIAGNOSIS — E785 Hyperlipidemia, unspecified: Secondary | ICD-10-CM | POA: Diagnosis not present

## 2018-01-20 DIAGNOSIS — I4891 Unspecified atrial fibrillation: Secondary | ICD-10-CM | POA: Diagnosis not present

## 2018-01-20 DIAGNOSIS — R809 Proteinuria, unspecified: Secondary | ICD-10-CM | POA: Diagnosis not present

## 2018-01-20 DIAGNOSIS — E119 Type 2 diabetes mellitus without complications: Secondary | ICD-10-CM | POA: Diagnosis not present

## 2018-01-20 DIAGNOSIS — I10 Essential (primary) hypertension: Secondary | ICD-10-CM | POA: Diagnosis not present

## 2018-01-20 DIAGNOSIS — E139 Other specified diabetes mellitus without complications: Secondary | ICD-10-CM | POA: Diagnosis not present

## 2018-01-22 ENCOUNTER — Encounter: Payer: Medicare Other | Attending: Internal Medicine | Admitting: *Deleted

## 2018-01-22 ENCOUNTER — Ambulatory Visit (INDEPENDENT_AMBULATORY_CARE_PROVIDER_SITE_OTHER)
Admission: RE | Admit: 2018-01-22 | Discharge: 2018-01-22 | Disposition: A | Discharge status: Home / Self Care | Payer: Medicare Other | Source: Ambulatory Visit | Attending: Acute Care | Admitting: Acute Care

## 2018-01-22 DIAGNOSIS — Z122 Encounter for screening for malignant neoplasm of respiratory organs: Secondary | ICD-10-CM

## 2018-01-22 DIAGNOSIS — E119 Type 2 diabetes mellitus without complications: Secondary | ICD-10-CM | POA: Insufficient documentation

## 2018-01-22 DIAGNOSIS — E108 Type 1 diabetes mellitus with unspecified complications: Secondary | ICD-10-CM

## 2018-01-22 DIAGNOSIS — R918 Other nonspecific abnormal finding of lung field: Secondary | ICD-10-CM | POA: Diagnosis not present

## 2018-01-22 DIAGNOSIS — J439 Emphysema, unspecified: Secondary | ICD-10-CM | POA: Diagnosis not present

## 2018-01-22 DIAGNOSIS — IMO0002 Reserved for concepts with insufficient information to code with codable children: Secondary | ICD-10-CM

## 2018-01-22 DIAGNOSIS — Z713 Dietary counseling and surveillance: Secondary | ICD-10-CM | POA: Insufficient documentation

## 2018-01-22 DIAGNOSIS — F17210 Nicotine dependence, cigarettes, uncomplicated: Secondary | ICD-10-CM

## 2018-01-22 DIAGNOSIS — E1065 Type 1 diabetes mellitus with hyperglycemia: Secondary | ICD-10-CM

## 2018-01-22 NOTE — Patient Instructions (Signed)
Plan:  Aim for 2 Carb Choices per meal (30 grams) +/- 1 either way  Aim for 0-1 Carbs per snack if hungry  Include protein in moderation with your meals and snacks Consider reading food labels for Total Carbohydrate of foods Continue with your activity level by walking for 20-30 minutes twice daily as tolerated Continue checking BG at alternate times per day as directed by MD  Continue taking medication as directed by MD

## 2018-01-22 NOTE — Progress Notes (Signed)
Diabetes Self-Management Education  Visit Type: First/Initial  Appt. Start Time: 0930 Appt. End Time: 1100  01/22/2018  [name], identified by name and date of birth, is a 67 y.o. female with a diagnosis of Diabetes: Type 1. Patient was initially diagnosed with type 2 Diabetes in the late 1990's. 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 She has remained there after her mother passed away, but has had a lot of responsibility and emotional stress with her care and her passing away. She shared with me that she has decided to take control of her eating habits, aim for a healthier weight and better control of her diabetes. She states she has lost about 25 pounds since January of this year and is eating very carefully to try to avoid triggers of sweeter foods. She is exercising twice a day and is using the [place] for BG monitoring now. She has been recently diagnosed with type 1 diabetes after several episodes of DKA and lab work confirming her C-peptide is extremely low.   ASSESSMENT  Height 5\' 5"  (1.651 m), weight 175 lb 14.4 oz (79.8 kg). Body mass index is 29.27 kg/m.  Diabetes Self-Management Education - 01/22/18 0948      Visit Information   Visit Type  First/Initial      Initial Visit   Diabetes Type  Type 1    Are you currently following a meal plan?  No    Are you taking your medications as prescribed?  Yes    Date Diagnosed  2000      Health Coping   How would you rate your overall health?  Fair      Psychosocial Assessment   Patient Belief/Attitude about Diabetes  -- all of the above at different times    Self-care barriers  None    Self-management support  Family;Friends    Other persons present  Patient    Patient Concerns  Nutrition/Meal planning;Glycemic Control;Weight Control    Special Needs  None    Learning Readiness  Change in progress    How  often do you need to have someone help you when you read instructions, pamphlets, or other written materials from your doctor or pharmacy?  1 - Never    What is the last grade level you completed in school?  college graduate      Pre-Education Assessment   Patient understands the diabetes disease and treatment process.  Needs Review    Patient understands incorporating nutritional management into lifestyle.  Needs Review    Patient undertands incorporating physical activity into lifestyle.  Demonstrates understanding / competency    Patient understands using medications safely.  Demonstrates understanding / competency    Patient understands monitoring blood glucose, interpreting and using results  Demonstrates understanding / competency    Patient understands prevention, detection, and treatment of acute complications.  Demonstrates understanding / competency    Patient understands prevention, detection, and treatment of chronic complications.  Demonstrates understanding / competency    Patient understands how to develop strategies to address psychosocial issues.  Needs Review    Patient understands how to develop strategies to promote health/change behavior.  Needs Review      Complications   Last HgB A1C per patient/outside source  8.9 %    How often do you check your blood sugar?  > 4 times/day  Have you had a dilated eye exam in the past 12 months?  Yes    Have you had a dental exam in the past 12 months?  Yes    Are you checking your feet?  Yes    How many days per week are you checking your feet?  2      Dietary Intake   Breakfast  mini egg white omelet with spinach, small bowl of berries    Snack (morning)  not usually    Lunch  large green salad with mixed greens, chopped up home made [place] breast, plain shredded wheat small biscuits    Snack (afternoon)  [place] occasionally if hungry    Dinner  steamed broccoli, fresh tomatoes, 1 pat butter microwaved together occasionally with  chicken or [place]    Beverage(s)  water      Exercise   Exercise Type  Light (walking / raking leaves)    How many days per week to you exercise?  7    How many minutes per day do you exercise?  40    Total minutes per week of exercise  280      Patient Education   Previous Diabetes Education  Yes (please comment) when first diagnosed about 20 years ago    Disease state   Definition of diabetes, type 1 and 2, and the diagnosis of diabetes;Factors that contribute to the development of diabetes    Nutrition management   Role of diet in the treatment of diabetes and the relationship between the three main macronutrients and blood glucose level;Carbohydrate counting;Food label reading, portion sizes and measuring food.    Physical activity and exercise   Role of exercise on diabetes management, blood pressure control and cardiac health.    Medications  Reviewed patients medication for diabetes, action, purpose, timing of dose and side effects.    Acute complications  Taught treatment of hypoglycemia - the 15 rule.      Individualized Goals (developed by patient)   Nutrition  Follow meal plan discussed    Physical Activity  Exercise 3-5 times per week    Medications  take my medication as prescribed    Monitoring   test blood glucose pre and post meals as discussed      Post-Education Assessment   Patient understands the diabetes disease and treatment process.  Demonstrates understanding / competency    Patient understands incorporating nutritional management into lifestyle.  Demonstrates understanding / competency    Patient undertands incorporating physical activity into lifestyle.  Demonstrates understanding / competency    Patient understands using medications safely.  Demonstrates understanding / competency    Patient understands monitoring blood glucose, interpreting and using results  Demonstrates understanding / competency    Patient understands prevention, detection, and treatment of  acute complications.  Demonstrates understanding / competency    Patient understands prevention, detection, and treatment of chronic complications.  Demonstrates understanding / competency    Patient understands how to develop strategies to address psychosocial issues.  Demonstrates understanding / competency    Patient understands how to develop strategies to promote health/change behavior.  Demonstrates understanding / competency      Outcomes   Expected Outcomes  Demonstrated interest in learning. Expect positive outcomes    Future DMSE  4-6 wks    Program Status  Not Completed       Individualized Plan for Diabetes Self-Management Training:   Learning Objective:  Patient will have a greater understanding of diabetes self-management. Patient  education plan is to attend individual and/or group sessions per assessed needs and concerns.   Plan:   Patient Instructions  Plan:  Aim for 2 Carb Choices per meal (30 grams) +/- 1 either way  Aim for 0-1 Carbs per snack if hungry  Include protein in moderation with your meals and snacks Consider reading food labels for Total Carbohydrate of foods Continue with your activity level by walking for 20-30 minutes twice daily as tolerated Continue checking BG at alternate times per day as directed by MD  Continue taking medication as directed by MD  We discussed insulin action times and the potential use of an Insulin to Carb ratio so she can adjust dose based on actual food consumed. I introduced the idea of insulin pumps, but she is not interested in that quite yet.   I also informed her of the Type 1 Diabetes Support Group, but she lives over an hour away, so may not be an option for her.   Expected Outcomes:  Demonstrated interest in learning. Expect positive outcomes  Education material provided: Living Well with Diabetes, A1C conversion sheet, Meal plan card and Carbohydrate counting sheet  If problems or questions, patient to contact  team via:  Phone  Future DSME appointment: 4-6 wks, once she confirms insurance coverage for these visits.

## 2018-01-31 ENCOUNTER — Other Ambulatory Visit: Payer: Self-pay | Admitting: Acute Care

## 2018-01-31 DIAGNOSIS — F17210 Nicotine dependence, cigarettes, uncomplicated: Secondary | ICD-10-CM

## 2018-01-31 DIAGNOSIS — Z122 Encounter for screening for malignant neoplasm of respiratory organs: Secondary | ICD-10-CM

## 2018-02-20 ENCOUNTER — Ambulatory Visit (INDEPENDENT_AMBULATORY_CARE_PROVIDER_SITE_OTHER): Payer: Medicare Other | Admitting: Pulmonary Disease

## 2018-02-20 ENCOUNTER — Encounter: Payer: Self-pay | Admitting: Pulmonary Disease

## 2018-02-20 VITALS — BP 112/70 | HR 55 | Ht 65.0 in | Wt 168.0 lb

## 2018-02-20 DIAGNOSIS — F17210 Nicotine dependence, cigarettes, uncomplicated: Secondary | ICD-10-CM

## 2018-02-20 DIAGNOSIS — J438 Other emphysema: Secondary | ICD-10-CM | POA: Diagnosis not present

## 2018-02-20 NOTE — Progress Notes (Signed)
Subjective:   PATIENT ID: Carol Valenzuela GENDER: female DOB: [birth year], MRN: [id number]  Synopsis: Referred in March 2019 for an abnormal CT scan of the chest that showed a left lower lobe pulmonary nodule and pulmonary vascular enlargement with mild centrilobular emphysema.[name]  HPI  Chief Complaint  Patient presents with  . Follow-up    ROV, started Chantix and smoking less than pack a day    [name] says that she has been doing well lately.   She was found to have a thyroid nodule and she is following with an endocrinologist (as well as for DM2). She is trying to exercise more and diet. She is taking Chantix and has cut back to 1 pack of cigarettes today.  She reports some nausea, this actually is bad enough to where she has to lay down during the day.  She has not vomited. She has been exercising routinely and is walking 25 minutes twice a day.  She has not done any weight work yet.  Past Medical History:  Diagnosis Date  . Diabetes ([place])   . Hyperthyroidism       Review of Systems  Constitutional: Negative for chills, fever, malaise/fatigue and weight loss.  HENT: Negative for congestion, nosebleeds, sinus pain and sore throat.   Eyes: Negative for photophobia, pain and discharge.  Respiratory: Negative for cough, hemoptysis, sputum production, shortness of breath and wheezing.   Cardiovascular: Negative for chest pain, palpitations, orthopnea and leg swelling.  Gastrointestinal: Negative for abdominal pain, constipation, diarrhea, nausea and vomiting.  Genitourinary: Negative for dysuria, frequency, hematuria and urgency.  Musculoskeletal: Negative for back pain, joint pain, myalgias and neck pain.  Skin: Negative for itching and rash.  Neurological: Negative for tingling, tremors, sensory change, speech change, focal weakness, seizures, weakness and headaches.  Psychiatric/Behavioral: Negative for memory loss, substance abuse and suicidal ideas. The patient is not  nervous/anxious.       Objective:  Physical Exam   Vitals:   02/20/18 0949  BP: 112/70  Pulse: (!) 55  SpO2: 98%  Weight: 168 lb (76.2 kg)  Height: 5\' 5"  (1.651 m)   RA  Gen: well appearing HENT: OP clear, TM's clear, neck supple PULM: CTA B, normal percussion CV: Irreg irreg, no mgr, trace edema GI: BS+, soft, nontender Derm: no cyanosis or rash Psyche: normal mood and affect    CBC No results found for: WBC, RBC, HGB, HCT, PLT, MCV, MCH, MCHC, RDW, LYMPHSABS, MONOABS, EOSABS, BASOSABS   Chest imaging: January 2019 CT chest images independently reviewed showing a 10 mm nodule in the left lower lobe, pulmonary vascular dilation, centrilobular emphysema, coronary atherosclerosis  May 2019 CT chest, low-dose: Mild upper lobe predominant centrilobular emphysema, RADS 2  PFT: 2019 spirometry: ratio 64% FEV1 1.62L 63% pred  Labs:  Path:  Echo:  Heart Catheterization:       Assessment & Plan:   Moderate smoker (20 or less per day)  Other emphysema ([place])  Cigarette smoker  Discussion: [name] returns to clinic today for follow-up on centrilobular emphysema, moderate COPD and ongoing tobacco abuse.  She has no respiratory symptoms so she would not benefit from bronchodilator therapy.  She is exercising routinely which is a good thing I have encouraged her to continue doing that.  She is experiencing nausea from the Chantix so we talked about treatment options, see details below.  Plan: Centrilobular emphysema and moderate COPD: At this time because you have no respiratory symptoms you do not need to take  any sort of inhalers.  However if you get shortness of breath please let me know. Continue exercising routinely I will recommend that you work in some resistance training such as a class called body pump available at most [employer].  Cigarette smoking: Quit smoking Get another CT scan in May 2020  Nausea: Try taking Chantix with meals If that is not effective  then take 1 mg at night and not the morning dose If that is not effective then we can change the dose to 0.5 mg twice a day  Follow-up in May 2020 or sooner if needed  15 minutes spent in direct consultation with the patient, 25-minute visit   Current Outpatient Medications:  .  amLODipine (NORVASC) 5 MG tablet, Take 5 mg by mouth daily., Disp: , Rfl:  .  apixaban (ELIQUIS) 5 MG TABS tablet, Take 5 mg by mouth 2 (two) times daily., Disp: , Rfl:  .  atorvastatin (LIPITOR) 40 MG tablet, Take 40 mg by mouth daily., Disp: , Rfl:  .  calcium-vitamin D (CALCIUM 500+D HIGH POTENCY) 500-400 MG-UNIT tablet, Take 1 tablet by mouth daily., Disp: , Rfl:  .  cholecalciferol (VITAMIN D) 400 units TABS tablet, Take 400 Units by mouth daily., Disp: , Rfl:  .  co-enzyme Q-10 30 MG capsule, Take 30 mg by mouth daily., Disp: , Rfl:  .  furosemide (LASIX) 40 MG tablet, Take 40 mg by mouth daily., Disp: , Rfl:  .  insulin aspart (NOVOLOG FLEXPEN) 100 UNIT/ML FlexPen, Inject into the skin 3 (three) times daily with meals., Disp: , Rfl:  .  Insulin Degludec (TRESIBA) 100 UNIT/ML SOLN, Inject 18 Units into the skin daily., Disp: , Rfl:  .  lisinopril (PRINIVIL,ZESTRIL) 40 MG tablet, Take 20 mg by mouth daily. , Disp: , Rfl:  .  magnesium oxide (MAG-OX) 400 MG tablet, Take 400 mg by mouth daily., Disp: , Rfl:  .  metFORMIN (GLUCOPHAGE) 500 MG tablet, Take 500 mg by mouth daily with breakfast., Disp: , Rfl:  .  methimazole (TAPAZOLE) 5 MG tablet, Take 5 mg by mouth daily., Disp: , Rfl:  .  metoprolol succinate (TOPROL-XL) 50 MG 24 hr tablet, Take 50 mg by mouth daily. Take with or immediately following a meal., Disp: , Rfl:  .  multivitamin-iron-minerals-folic acid (CENTRUM) chewable tablet, Chew 1 tablet by mouth daily., Disp: , Rfl:  .  Omega-3 Fatty Acids (FISH OIL) 1000 MG CAPS, Take 1 capsule by mouth daily., Disp: , Rfl:  .  varenicline (CHANTIX PAK) 0.5 MG X 11 & 1 MG X 42 tablet, Take by mouth 2 (two) times  daily. Take one 0.5 mg tablet by mouth once daily for 3 days, then increase to one 0.5 mg tablet twice daily for 4 days, then increase to one 1 mg tablet twice daily., Disp: , Rfl:

## 2018-02-20 NOTE — Patient Instructions (Signed)
Centrilobular emphysema and moderate COPD: At this time because you have no respiratory symptoms you do not need to take any sort of inhalers.  However if you get shortness of breath please let me know. Continue exercising routinely I will recommend that you work in some resistance training such as a class called body pump available at most [employer].  Cigarette smoking: Quit smoking Get another CT scan in May 2020  Nausea: Try taking Chantix with meals If that is not effective then take 1 mg at night and not the morning dose If that is not effective then we can change the dose to 0.5 mg twice a day  Follow-up in May 2020 or sooner if needed

## 2018-03-04 DIAGNOSIS — F17210 Nicotine dependence, cigarettes, uncomplicated: Secondary | ICD-10-CM | POA: Diagnosis not present

## 2018-03-04 DIAGNOSIS — E139 Other specified diabetes mellitus without complications: Secondary | ICD-10-CM | POA: Diagnosis not present

## 2018-03-04 DIAGNOSIS — Z6834 Body mass index (BMI) 34.0-34.9, adult: Secondary | ICD-10-CM | POA: Diagnosis not present

## 2018-03-04 DIAGNOSIS — E0590 Thyrotoxicosis, unspecified without thyrotoxic crisis or storm: Secondary | ICD-10-CM | POA: Diagnosis not present

## 2018-03-04 DIAGNOSIS — I10 Essential (primary) hypertension: Secondary | ICD-10-CM | POA: Diagnosis not present

## 2018-03-04 DIAGNOSIS — E119 Type 2 diabetes mellitus without complications: Secondary | ICD-10-CM | POA: Diagnosis not present

## 2018-03-04 DIAGNOSIS — R809 Proteinuria, unspecified: Secondary | ICD-10-CM | POA: Diagnosis not present

## 2018-03-04 DIAGNOSIS — E785 Hyperlipidemia, unspecified: Secondary | ICD-10-CM | POA: Diagnosis not present

## 2018-03-04 DIAGNOSIS — I4891 Unspecified atrial fibrillation: Secondary | ICD-10-CM | POA: Diagnosis not present

## 2018-03-27 DIAGNOSIS — I10 Essential (primary) hypertension: Secondary | ICD-10-CM | POA: Diagnosis not present

## 2018-03-27 DIAGNOSIS — F17210 Nicotine dependence, cigarettes, uncomplicated: Secondary | ICD-10-CM | POA: Diagnosis not present

## 2018-03-27 DIAGNOSIS — E785 Hyperlipidemia, unspecified: Secondary | ICD-10-CM | POA: Diagnosis not present

## 2018-03-27 DIAGNOSIS — E139 Other specified diabetes mellitus without complications: Secondary | ICD-10-CM | POA: Diagnosis not present

## 2018-03-27 DIAGNOSIS — R809 Proteinuria, unspecified: Secondary | ICD-10-CM | POA: Diagnosis not present

## 2018-03-27 DIAGNOSIS — E118 Type 2 diabetes mellitus with unspecified complications: Secondary | ICD-10-CM | POA: Diagnosis not present

## 2018-03-27 DIAGNOSIS — I4891 Unspecified atrial fibrillation: Secondary | ICD-10-CM | POA: Diagnosis not present

## 2018-03-27 DIAGNOSIS — Z6834 Body mass index (BMI) 34.0-34.9, adult: Secondary | ICD-10-CM | POA: Diagnosis not present

## 2018-03-27 DIAGNOSIS — E0590 Thyrotoxicosis, unspecified without thyrotoxic crisis or storm: Secondary | ICD-10-CM | POA: Diagnosis not present

## 2018-04-03 DIAGNOSIS — E049 Nontoxic goiter, unspecified: Secondary | ICD-10-CM | POA: Diagnosis not present

## 2018-04-03 DIAGNOSIS — E118 Type 2 diabetes mellitus with unspecified complications: Secondary | ICD-10-CM | POA: Diagnosis not present

## 2018-04-03 DIAGNOSIS — I071 Rheumatic tricuspid insufficiency: Secondary | ICD-10-CM | POA: Diagnosis not present

## 2018-04-03 DIAGNOSIS — I10 Essential (primary) hypertension: Secondary | ICD-10-CM | POA: Diagnosis not present

## 2018-04-03 DIAGNOSIS — Z794 Long term (current) use of insulin: Secondary | ICD-10-CM | POA: Diagnosis not present

## 2018-04-03 DIAGNOSIS — E875 Hyperkalemia: Secondary | ICD-10-CM | POA: Diagnosis not present

## 2018-04-03 DIAGNOSIS — E0590 Thyrotoxicosis, unspecified without thyrotoxic crisis or storm: Secondary | ICD-10-CM | POA: Diagnosis not present

## 2018-04-03 DIAGNOSIS — Z6834 Body mass index (BMI) 34.0-34.9, adult: Secondary | ICD-10-CM | POA: Diagnosis not present

## 2018-04-03 DIAGNOSIS — I481 Persistent atrial fibrillation: Secondary | ICD-10-CM | POA: Diagnosis not present

## 2018-04-03 DIAGNOSIS — Z0000 Encounter for general adult medical examination without abnormal findings: Secondary | ICD-10-CM | POA: Diagnosis not present

## 2018-04-03 DIAGNOSIS — I340 Nonrheumatic mitral (valve) insufficiency: Secondary | ICD-10-CM | POA: Diagnosis not present

## 2018-04-03 DIAGNOSIS — I4891 Unspecified atrial fibrillation: Secondary | ICD-10-CM | POA: Diagnosis not present

## 2018-04-03 DIAGNOSIS — I119 Hypertensive heart disease without heart failure: Secondary | ICD-10-CM | POA: Diagnosis not present

## 2018-04-03 DIAGNOSIS — E785 Hyperlipidemia, unspecified: Secondary | ICD-10-CM | POA: Diagnosis not present

## 2018-04-03 DIAGNOSIS — E119 Type 2 diabetes mellitus without complications: Secondary | ICD-10-CM | POA: Diagnosis not present

## 2018-04-14 ENCOUNTER — Ambulatory Visit: Payer: Medicare Other | Admitting: Podiatry

## 2018-04-14 DIAGNOSIS — N289 Disorder of kidney and ureter, unspecified: Secondary | ICD-10-CM | POA: Diagnosis not present

## 2018-04-24 ENCOUNTER — Ambulatory Visit (INDEPENDENT_AMBULATORY_CARE_PROVIDER_SITE_OTHER): Payer: Medicare Other | Admitting: Podiatry

## 2018-04-24 ENCOUNTER — Encounter: Payer: Self-pay | Admitting: Podiatry

## 2018-04-24 VITALS — BP 154/90 | HR 99 | Resp 16

## 2018-04-24 DIAGNOSIS — E1140 Type 2 diabetes mellitus with diabetic neuropathy, unspecified: Secondary | ICD-10-CM

## 2018-04-24 DIAGNOSIS — E669 Obesity, unspecified: Secondary | ICD-10-CM | POA: Insufficient documentation

## 2018-04-24 DIAGNOSIS — E785 Hyperlipidemia, unspecified: Secondary | ICD-10-CM | POA: Insufficient documentation

## 2018-04-24 DIAGNOSIS — L600 Ingrowing nail: Secondary | ICD-10-CM | POA: Diagnosis not present

## 2018-04-24 DIAGNOSIS — Q828 Other specified congenital malformations of skin: Secondary | ICD-10-CM | POA: Diagnosis not present

## 2018-04-24 DIAGNOSIS — E1149 Type 2 diabetes mellitus with other diabetic neurological complication: Secondary | ICD-10-CM

## 2018-04-24 DIAGNOSIS — M7918 Myalgia, other site: Secondary | ICD-10-CM | POA: Insufficient documentation

## 2018-04-24 NOTE — Progress Notes (Signed)
   Subjective:    Patient ID: Carol Valenzuela, female    DOB: 1951-04-05, 67 y.o.   MRN: [id number]  HPI    Review of Systems  All other systems reviewed and are negative.      Objective:   Physical Exam        Assessment & Plan:

## 2018-04-24 NOTE — Patient Instructions (Signed)

## 2018-04-24 NOTE — Progress Notes (Signed)
Subjective:   Patient ID: Carol Valenzuela, female   DOB: 67 y.o.   MRN: [id number]   HPI Patient presents with painful ingrown toenail of the right hallux lateral border and lesion underneath the second metatarsal.  She has a fairly long-term history of diabetes that is been under good control with slight increase lately but states her numbers have been running at an A1c of approximately 6.0.  Patient states her sugars have been around 100 when checked and patient does smoke 1/2 pack/day currently and likes to be active   Review of Systems  All other systems reviewed and are negative.       Objective:  Physical Exam  Constitutional: She appears well-developed and well-nourished.  Cardiovascular: Intact distal pulses.  Pulmonary/Chest: Effort normal.  Musculoskeletal: Normal range of motion.  Neurological: She is alert.  Skin: Skin is warm.  Nursing note and vitals reviewed.   Vascular status found to be intact with mild diminishment sharp dull vibratory bilateral.  Patient has good digital perfusion is well oriented and on the lateral side of the left hallux the nail is quite incurvated tender in the corner with no drainage or active redness noted.  Patient is keratotic lesion subsecond metatarsal left is been moderately painful and patient does not have advanced indications of neuropathic changes     Assessment:  Patient with significant ingrown toenail deformity left hallux lateral border and lesion formation second metatarsal left with mild neurological issues no vascular issues and well-controlled diabetes     Plan:  H&P condition reviewed and discussed correction of the ingrown.  Patient wants this done and I did explain the risk associated with her diabetes of healing and patient wants procedure and read consent form and signed.  Today I infiltrated the left hallux 60 mg like Marcaine mixture sterile prep applied to the toe and using sterile instrumentation I remove the lateral  border exposed matrix and applied phenol 3 applications 30 seconds followed by alcohol lavage sterile dressing.  Debrided lesion subsecond metatarsal left and hopefully this will not need to be done again and explained continued good care of her diabetes and also foot examinations.  Patient is encouraged to call with any questions she may have or concerns

## 2018-05-15 ENCOUNTER — Ambulatory Visit (INDEPENDENT_AMBULATORY_CARE_PROVIDER_SITE_OTHER): Payer: Medicare Other | Admitting: Podiatry

## 2018-05-15 ENCOUNTER — Encounter: Payer: Self-pay | Admitting: Podiatry

## 2018-05-15 DIAGNOSIS — E1149 Type 2 diabetes mellitus with other diabetic neurological complication: Secondary | ICD-10-CM | POA: Diagnosis not present

## 2018-05-15 DIAGNOSIS — E1140 Type 2 diabetes mellitus with diabetic neuropathy, unspecified: Secondary | ICD-10-CM | POA: Diagnosis not present

## 2018-05-15 DIAGNOSIS — L600 Ingrowing nail: Secondary | ICD-10-CM

## 2018-05-15 DIAGNOSIS — Z1212 Encounter for screening for malignant neoplasm of rectum: Secondary | ICD-10-CM | POA: Diagnosis not present

## 2018-05-15 DIAGNOSIS — M810 Age-related osteoporosis without current pathological fracture: Secondary | ICD-10-CM | POA: Diagnosis not present

## 2018-05-15 DIAGNOSIS — Z01419 Encounter for gynecological examination (general) (routine) without abnormal findings: Secondary | ICD-10-CM | POA: Diagnosis not present

## 2018-05-15 NOTE — Progress Notes (Signed)
Subjective:   Patient ID: Carol Valenzuela, female   DOB: 67 y.o.   MRN: [id number]   HPI Patient presents stating that she was concerned about the healing of her ingrown is also has several other questions concerning her   ROS      Objective:  Physical Exam  Neurovascular status was found to be intact with patient's right hallux lateral border healing well with patient having slight discoloration of the adjacent nails that she is concerned about and no active drainage noted     Assessment:  Healing well from ingrown toenail correction right with mild fungal infection     Plan:  Advised on continued soaks continued bandage usage during the day area dries reappoint if symptoms indicate

## 2018-05-20 ENCOUNTER — Other Ambulatory Visit: Payer: Self-pay | Admitting: Internal Medicine

## 2018-05-20 DIAGNOSIS — Z1231 Encounter for screening mammogram for malignant neoplasm of breast: Secondary | ICD-10-CM

## 2018-05-22 ENCOUNTER — Ambulatory Visit
Admission: RE | Admit: 2018-05-22 | Discharge: 2018-05-22 | Disposition: A | Discharge status: Home / Self Care | Payer: Medicare Other | Source: Ambulatory Visit | Attending: Internal Medicine | Admitting: Internal Medicine

## 2018-05-22 DIAGNOSIS — Z1231 Encounter for screening mammogram for malignant neoplasm of breast: Secondary | ICD-10-CM | POA: Diagnosis not present

## 2018-05-22 DIAGNOSIS — I10 Essential (primary) hypertension: Secondary | ICD-10-CM | POA: Diagnosis not present

## 2018-05-22 DIAGNOSIS — M810 Age-related osteoporosis without current pathological fracture: Secondary | ICD-10-CM | POA: Diagnosis not present

## 2018-05-22 DIAGNOSIS — F17210 Nicotine dependence, cigarettes, uncomplicated: Secondary | ICD-10-CM | POA: Diagnosis not present

## 2018-06-26 DIAGNOSIS — E118 Type 2 diabetes mellitus with unspecified complications: Secondary | ICD-10-CM | POA: Diagnosis not present

## 2018-06-26 DIAGNOSIS — I10 Essential (primary) hypertension: Secondary | ICD-10-CM | POA: Diagnosis not present

## 2018-06-26 DIAGNOSIS — M810 Age-related osteoporosis without current pathological fracture: Secondary | ICD-10-CM | POA: Diagnosis not present

## 2018-07-07 DIAGNOSIS — E139 Other specified diabetes mellitus without complications: Secondary | ICD-10-CM | POA: Diagnosis not present

## 2018-07-07 DIAGNOSIS — Z6834 Body mass index (BMI) 34.0-34.9, adult: Secondary | ICD-10-CM | POA: Diagnosis not present

## 2018-07-07 DIAGNOSIS — E785 Hyperlipidemia, unspecified: Secondary | ICD-10-CM | POA: Diagnosis not present

## 2018-07-07 DIAGNOSIS — I10 Essential (primary) hypertension: Secondary | ICD-10-CM | POA: Diagnosis not present

## 2018-07-07 DIAGNOSIS — E0590 Thyrotoxicosis, unspecified without thyrotoxic crisis or storm: Secondary | ICD-10-CM | POA: Diagnosis not present

## 2018-07-07 DIAGNOSIS — E119 Type 2 diabetes mellitus without complications: Secondary | ICD-10-CM | POA: Diagnosis not present

## 2018-07-07 DIAGNOSIS — I4891 Unspecified atrial fibrillation: Secondary | ICD-10-CM | POA: Diagnosis not present

## 2018-07-07 DIAGNOSIS — F17210 Nicotine dependence, cigarettes, uncomplicated: Secondary | ICD-10-CM | POA: Diagnosis not present

## 2018-07-07 DIAGNOSIS — R809 Proteinuria, unspecified: Secondary | ICD-10-CM | POA: Diagnosis not present

## 2018-08-06 DIAGNOSIS — F17210 Nicotine dependence, cigarettes, uncomplicated: Secondary | ICD-10-CM | POA: Diagnosis not present

## 2018-08-06 DIAGNOSIS — E119 Type 2 diabetes mellitus without complications: Secondary | ICD-10-CM | POA: Diagnosis not present

## 2018-08-06 DIAGNOSIS — E785 Hyperlipidemia, unspecified: Secondary | ICD-10-CM | POA: Diagnosis not present

## 2018-08-06 DIAGNOSIS — E139 Other specified diabetes mellitus without complications: Secondary | ICD-10-CM | POA: Diagnosis not present

## 2018-08-06 DIAGNOSIS — Z6834 Body mass index (BMI) 34.0-34.9, adult: Secondary | ICD-10-CM | POA: Diagnosis not present

## 2018-08-06 DIAGNOSIS — I4891 Unspecified atrial fibrillation: Secondary | ICD-10-CM | POA: Diagnosis not present

## 2018-08-06 DIAGNOSIS — Z23 Encounter for immunization: Secondary | ICD-10-CM | POA: Diagnosis not present

## 2018-08-06 DIAGNOSIS — I10 Essential (primary) hypertension: Secondary | ICD-10-CM | POA: Diagnosis not present

## 2018-08-06 DIAGNOSIS — R809 Proteinuria, unspecified: Secondary | ICD-10-CM | POA: Diagnosis not present

## 2018-08-06 DIAGNOSIS — E0590 Thyrotoxicosis, unspecified without thyrotoxic crisis or storm: Secondary | ICD-10-CM | POA: Diagnosis not present

## 2018-08-22 DIAGNOSIS — J180 Bronchopneumonia, unspecified organism: Secondary | ICD-10-CM | POA: Diagnosis not present

## 2018-08-22 DIAGNOSIS — R350 Frequency of micturition: Secondary | ICD-10-CM | POA: Diagnosis not present

## 2018-08-22 DIAGNOSIS — R5383 Other fatigue: Secondary | ICD-10-CM | POA: Diagnosis not present

## 2018-08-22 DIAGNOSIS — F17200 Nicotine dependence, unspecified, uncomplicated: Secondary | ICD-10-CM | POA: Diagnosis not present

## 2018-08-22 DIAGNOSIS — R310 Gross hematuria: Secondary | ICD-10-CM | POA: Diagnosis not present

## 2018-08-22 DIAGNOSIS — R319 Hematuria, unspecified: Secondary | ICD-10-CM | POA: Diagnosis not present

## 2018-08-22 DIAGNOSIS — R3121 Asymptomatic microscopic hematuria: Secondary | ICD-10-CM | POA: Diagnosis not present

## 2018-08-22 DIAGNOSIS — E1165 Type 2 diabetes mellitus with hyperglycemia: Secondary | ICD-10-CM | POA: Diagnosis not present

## 2018-08-22 DIAGNOSIS — N390 Urinary tract infection, site not specified: Secondary | ICD-10-CM | POA: Diagnosis not present

## 2018-08-22 DIAGNOSIS — R300 Dysuria: Secondary | ICD-10-CM | POA: Diagnosis not present

## 2018-08-22 DIAGNOSIS — D72829 Elevated white blood cell count, unspecified: Secondary | ICD-10-CM | POA: Diagnosis not present

## 2018-08-22 DIAGNOSIS — R509 Fever, unspecified: Secondary | ICD-10-CM | POA: Diagnosis not present

## 2018-08-23 DIAGNOSIS — J180 Bronchopneumonia, unspecified organism: Secondary | ICD-10-CM | POA: Diagnosis not present

## 2018-08-23 DIAGNOSIS — D72829 Elevated white blood cell count, unspecified: Secondary | ICD-10-CM | POA: Diagnosis not present

## 2018-08-23 DIAGNOSIS — R509 Fever, unspecified: Secondary | ICD-10-CM | POA: Diagnosis not present

## 2018-08-23 DIAGNOSIS — E1165 Type 2 diabetes mellitus with hyperglycemia: Secondary | ICD-10-CM | POA: Diagnosis not present

## 2018-10-08 DIAGNOSIS — R809 Proteinuria, unspecified: Secondary | ICD-10-CM | POA: Diagnosis not present

## 2018-10-08 DIAGNOSIS — F17210 Nicotine dependence, cigarettes, uncomplicated: Secondary | ICD-10-CM | POA: Diagnosis not present

## 2018-10-08 DIAGNOSIS — E118 Type 2 diabetes mellitus with unspecified complications: Secondary | ICD-10-CM | POA: Diagnosis not present

## 2018-10-08 DIAGNOSIS — Z6834 Body mass index (BMI) 34.0-34.9, adult: Secondary | ICD-10-CM | POA: Diagnosis not present

## 2018-10-08 DIAGNOSIS — E119 Type 2 diabetes mellitus without complications: Secondary | ICD-10-CM | POA: Diagnosis not present

## 2018-10-08 DIAGNOSIS — E139 Other specified diabetes mellitus without complications: Secondary | ICD-10-CM | POA: Diagnosis not present

## 2018-10-08 DIAGNOSIS — I10 Essential (primary) hypertension: Secondary | ICD-10-CM | POA: Diagnosis not present

## 2018-10-08 DIAGNOSIS — E0590 Thyrotoxicosis, unspecified without thyrotoxic crisis or storm: Secondary | ICD-10-CM | POA: Diagnosis not present

## 2018-10-08 DIAGNOSIS — I4891 Unspecified atrial fibrillation: Secondary | ICD-10-CM | POA: Diagnosis not present

## 2018-10-08 DIAGNOSIS — E785 Hyperlipidemia, unspecified: Secondary | ICD-10-CM | POA: Diagnosis not present

## 2018-10-16 DIAGNOSIS — I10 Essential (primary) hypertension: Secondary | ICD-10-CM | POA: Diagnosis not present

## 2018-10-16 DIAGNOSIS — E119 Type 2 diabetes mellitus without complications: Secondary | ICD-10-CM | POA: Diagnosis not present

## 2018-10-16 DIAGNOSIS — I480 Paroxysmal atrial fibrillation: Secondary | ICD-10-CM | POA: Diagnosis not present

## 2018-10-16 DIAGNOSIS — Z794 Long term (current) use of insulin: Secondary | ICD-10-CM | POA: Diagnosis not present

## 2018-11-12 ENCOUNTER — Other Ambulatory Visit: Payer: Self-pay | Admitting: Cardiology

## 2018-11-12 DIAGNOSIS — I480 Paroxysmal atrial fibrillation: Secondary | ICD-10-CM

## 2018-11-12 DIAGNOSIS — F17210 Nicotine dependence, cigarettes, uncomplicated: Secondary | ICD-10-CM | POA: Diagnosis not present

## 2018-11-12 DIAGNOSIS — E139 Other specified diabetes mellitus without complications: Secondary | ICD-10-CM | POA: Diagnosis not present

## 2018-11-12 DIAGNOSIS — Z6834 Body mass index (BMI) 34.0-34.9, adult: Secondary | ICD-10-CM | POA: Diagnosis not present

## 2018-11-12 DIAGNOSIS — I071 Rheumatic tricuspid insufficiency: Secondary | ICD-10-CM

## 2018-11-12 DIAGNOSIS — E0590 Thyrotoxicosis, unspecified without thyrotoxic crisis or storm: Secondary | ICD-10-CM | POA: Diagnosis not present

## 2018-11-12 DIAGNOSIS — E785 Hyperlipidemia, unspecified: Secondary | ICD-10-CM | POA: Diagnosis not present

## 2018-11-12 DIAGNOSIS — I340 Nonrheumatic mitral (valve) insufficiency: Secondary | ICD-10-CM

## 2018-11-12 DIAGNOSIS — I10 Essential (primary) hypertension: Secondary | ICD-10-CM | POA: Diagnosis not present

## 2018-11-12 DIAGNOSIS — R809 Proteinuria, unspecified: Secondary | ICD-10-CM | POA: Diagnosis not present

## 2018-11-12 DIAGNOSIS — E119 Type 2 diabetes mellitus without complications: Secondary | ICD-10-CM | POA: Diagnosis not present

## 2018-11-12 DIAGNOSIS — I4891 Unspecified atrial fibrillation: Secondary | ICD-10-CM | POA: Diagnosis not present

## 2018-11-20 ENCOUNTER — Telehealth: Payer: Self-pay | Admitting: Pulmonary Disease

## 2018-11-20 DIAGNOSIS — H2513 Age-related nuclear cataract, bilateral: Secondary | ICD-10-CM | POA: Diagnosis not present

## 2018-11-20 DIAGNOSIS — H35033 Hypertensive retinopathy, bilateral: Secondary | ICD-10-CM | POA: Diagnosis not present

## 2018-11-20 DIAGNOSIS — H25013 Cortical age-related cataract, bilateral: Secondary | ICD-10-CM | POA: Diagnosis not present

## 2018-11-20 DIAGNOSIS — E119 Type 2 diabetes mellitus without complications: Secondary | ICD-10-CM | POA: Diagnosis not present

## 2018-11-20 NOTE — Telephone Encounter (Signed)
.   Spoke with Carol Valenzuela her sister and she stated the pt wa snot there. She asked if we could call her back tomorrow. I will leave encounter open for LM.   Patient Instructions by Juanito Doom, MD at 02/20/2018 10:00 AM  Author: Juanito Doom, MD Author Type: Physician Filed: 02/20/2018 10:13 AM  Note Status: Signed Cosign: Cosign Not Required Encounter Date: 02/20/2018  Editor: Juanito Doom, MD (Physician)    Centrilobular emphysema and moderate COPD: At this time because you have no respiratory symptoms you do not need to take any sort of inhalers.  However if you get shortness of breath please let me know. Continue exercising routinely I will recommend that you work in some resistance training such as a class called body pump available at most [employer].  Cigarette smoking: Quit smoking Get another CT scan in May 2020  Nausea: Try taking Chantix with meals If that is not effective then take 1 mg at night and not the morning dose If that is not effective then we can change the dose to 0.5 mg twice a day  Follow-up in May 2020 or sooner if needed

## 2018-11-21 NOTE — Telephone Encounter (Signed)
Called and spoke with Patient.  Patient was confused about follow up, because she received a letter to schedule OV with BQ in 12/2018. Per BQ last OV- follow up in May 2020 or sooner if needed.  CT lung screening is ordered for 01/2019.  Patient is wanting to get CT scheduled, then schedule follow up with BQ after CT.  Will route message to [facility] to follow up for CT lung screen.   Per BQ- Cigarette smoking: Quit smoking Get another CT scan in May 2020  Nausea: Try taking Chantix with meals If that is not effective then take 1 mg at night and not the morning dose If that is not effective then we can change the dose to 0.5 mg twice a day  Follow-up in May 2020 or sooner if needed  Message routed to [place], [place]

## 2018-11-24 NOTE — Telephone Encounter (Signed)
Spoke with pt and scheduled f/u appt with Dr Lake Bells 02/12/19 9:00.  Rodena Piety will schedule CT for 01/26/19 and call pt with specific appt info. Pt verbalized understanding.  Nothing further needed.

## 2019-01-07 ENCOUNTER — Other Ambulatory Visit: Payer: Self-pay

## 2019-01-08 ENCOUNTER — Ambulatory Visit: Payer: 59 | Admitting: Pulmonary Disease

## 2019-01-15 ENCOUNTER — Ambulatory Visit: Payer: 59 | Admitting: Cardiology

## 2019-01-16 ENCOUNTER — Other Ambulatory Visit: Payer: Self-pay | Admitting: Cardiology

## 2019-01-19 NOTE — Telephone Encounter (Signed)
Please fill

## 2019-01-26 ENCOUNTER — Ambulatory Visit: Payer: Medicare Other

## 2019-02-10 ENCOUNTER — Telehealth: Payer: Self-pay | Admitting: *Deleted

## 2019-02-10 NOTE — Telephone Encounter (Signed)
Called pt to RS CT appt for Lung CA Screening. Pt is not ready to come in she will back when she is ready. Gave pt call back number [phone].

## 2019-02-12 ENCOUNTER — Ambulatory Visit: Payer: 59 | Admitting: Pulmonary Disease

## 2019-03-04 ENCOUNTER — Other Ambulatory Visit: Payer: Self-pay | Admitting: Acute Care

## 2019-03-04 DIAGNOSIS — F17210 Nicotine dependence, cigarettes, uncomplicated: Secondary | ICD-10-CM

## 2019-03-04 DIAGNOSIS — Z122 Encounter for screening for malignant neoplasm of respiratory organs: Secondary | ICD-10-CM

## 2019-03-10 ENCOUNTER — Other Ambulatory Visit: Payer: Self-pay | Admitting: *Deleted

## 2019-03-29 IMAGING — CT CT CHEST LCS NODULE FOLLOW-UP W/O CM
2 of 4 series · 8 of 40 positions shown, 10 images · non-contrast
Comparison: 10/16/2017

CLINICAL DATA: Lung cancer screening.Current asymptomatic smoker.
Thirty-three pack-year history.

EXAM:
CT CHEST WITHOUT CONTRAST FOR LUNG CANCER SCREENING NODULE FOLLOW-UP
TECHNIQUE: Multidetector CT imaging of the chest was performed following the
standard protocol without IV contrast.

[Series 4: coronal · coronal · 0.61mm/px · 3 of 255 slices shown]
[im 51/255  lung]
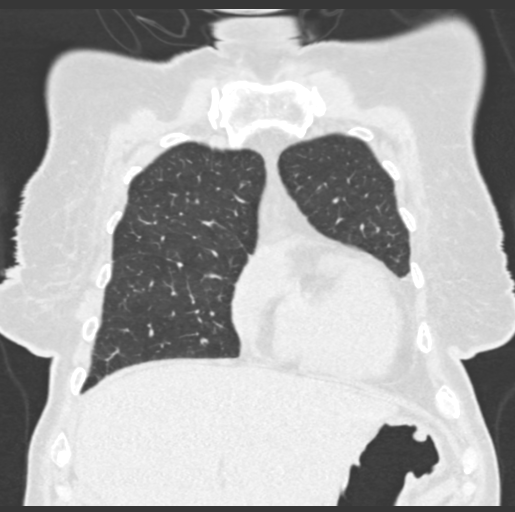
[im 102/255  lung]
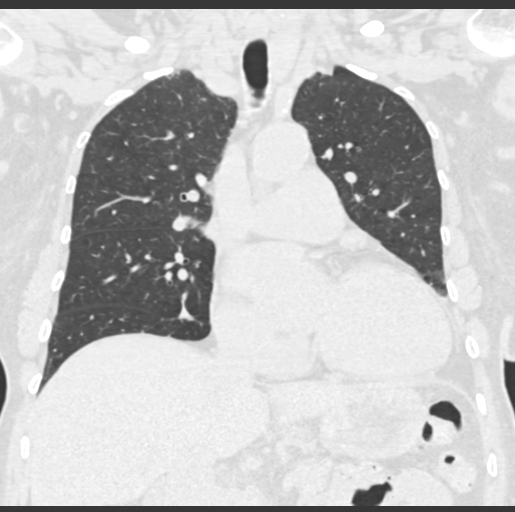
[im 153/255  lung]
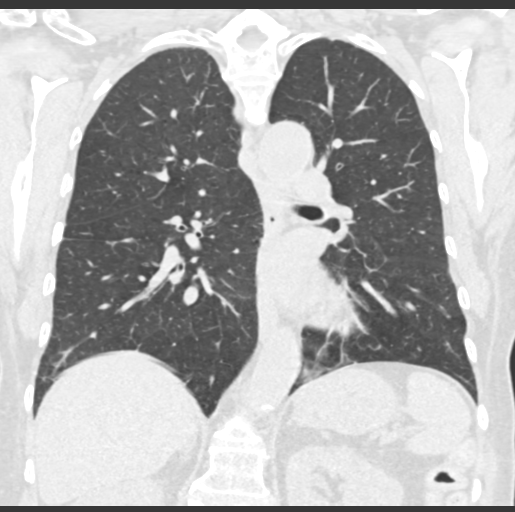

[ct lung segmentation data · axial · 0.65mm/px · z∈[-299,-299]mm · 5 of 301 frames shown]
[frame 1/301  mediastinal]
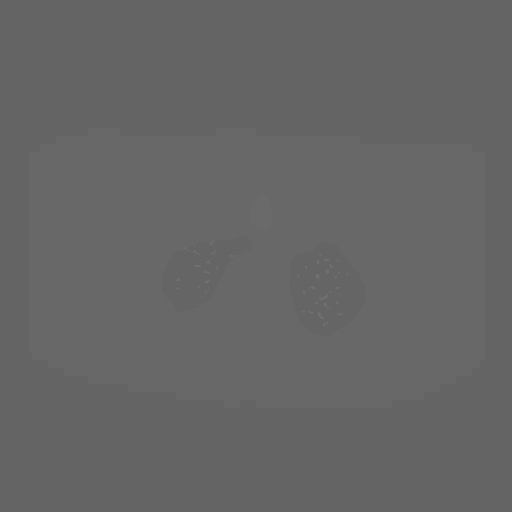
[frame 1/301  lung]
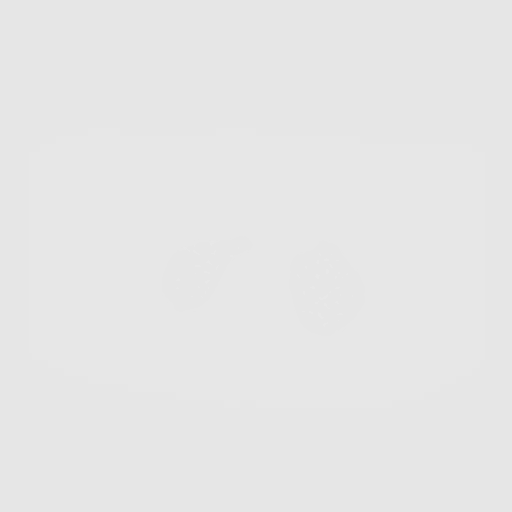
[frame 34/301  lung]
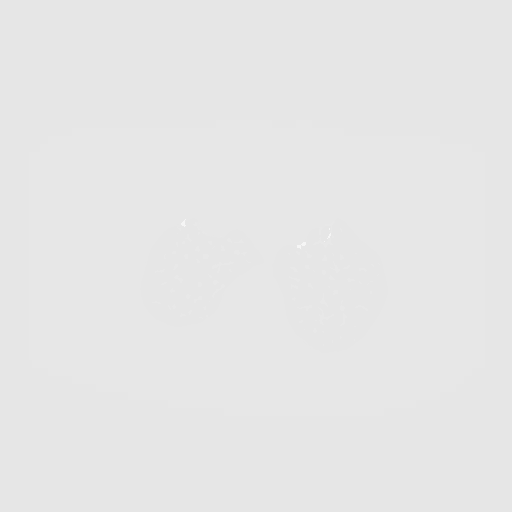
[frame 67/301  lung]
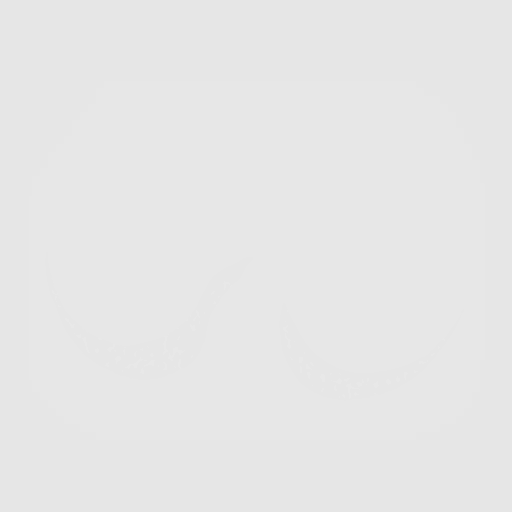
[frame 101/301  lung]
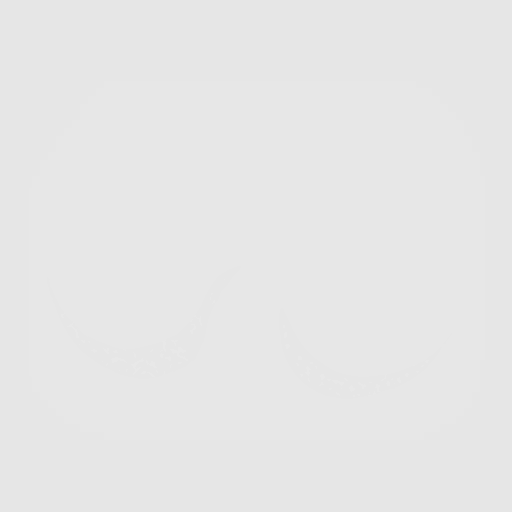
[frame 134/301  mediastinal]
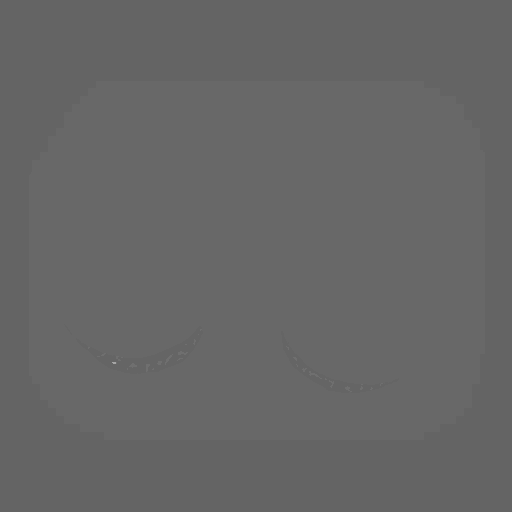
[frame 134/301  lung]
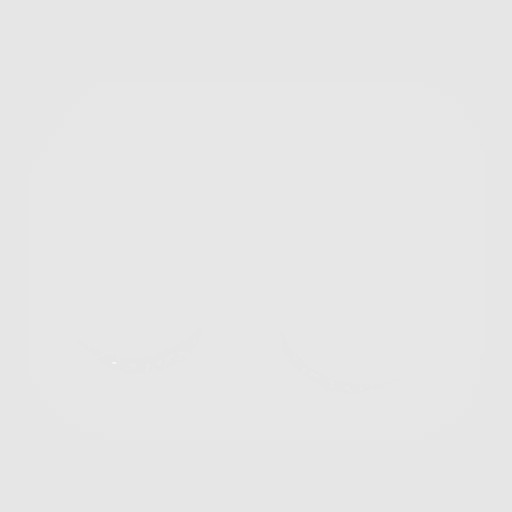

[8 of 40 positions shown; findings below may reference images not displayed]

FINDINGS: Cardiovascular: The heart size is moderately enlarged. Aortic
atherosclerosis identified. Calcification in the LAD coronary artery
noted.

Mediastinum/Nodes: Multiple thyroid nodules are identified. The
largest is in the right lobe measuring 1.8 cm, image [DATE]. The
trachea appears patent and midline. Normal appearance of the
esophagus. No enlarged mediastinal or hilar lymph nodes.

Lungs/Pleura: Mild to moderate changes of emphysema identified. The
previous solid nodule in the left lower lobe measuring 10.2 mm is
stable in the interval. The other smaller calcified and noncalcified
nodules are stable. No new pulmonary nodules or masses identified.

Upper Abdomen: No acute abnormality.

Musculoskeletal: Degenerative disc disease identified within the
thoracic spine. No aggressive lytic or sclerotic bone lesion
identified.
IMPRESSION: 1. Lung-RADS 2, benign appearance or behavior. Continue annual
screening with low-dose chest CT without contrast in 12 months.
2. Aortic Atherosclerosis (S31SJ-DX5.5) and Emphysema (S31SJ-XT9.8).
3. Right lobe of thyroid gland nodule measures 1.8 cm. Consider
further evaluation with thyroid ultrasound. If patient is clinically
hyperthyroid, consider nuclear medicine thyroid uptake and scan.

## 2019-04-13 ENCOUNTER — Inpatient Hospital Stay: Admission: RE | Admit: 2019-04-13 | Payer: 59 | Source: Ambulatory Visit

## 2019-04-13 DIAGNOSIS — E032 Hypothyroidism due to medicaments and other exogenous substances: Secondary | ICD-10-CM | POA: Diagnosis not present

## 2019-04-13 DIAGNOSIS — I10 Essential (primary) hypertension: Secondary | ICD-10-CM | POA: Diagnosis not present

## 2019-04-13 DIAGNOSIS — Z1159 Encounter for screening for other viral diseases: Secondary | ICD-10-CM | POA: Diagnosis not present

## 2019-04-13 DIAGNOSIS — E118 Type 2 diabetes mellitus with unspecified complications: Secondary | ICD-10-CM | POA: Diagnosis not present

## 2019-04-14 ENCOUNTER — Other Ambulatory Visit: Payer: Self-pay | Admitting: Internal Medicine

## 2019-04-14 DIAGNOSIS — Z1231 Encounter for screening mammogram for malignant neoplasm of breast: Secondary | ICD-10-CM

## 2019-04-16 DIAGNOSIS — I119 Hypertensive heart disease without heart failure: Secondary | ICD-10-CM | POA: Diagnosis not present

## 2019-04-16 DIAGNOSIS — I700 Atherosclerosis of aorta: Secondary | ICD-10-CM | POA: Diagnosis not present

## 2019-04-16 DIAGNOSIS — I480 Paroxysmal atrial fibrillation: Secondary | ICD-10-CM | POA: Diagnosis not present

## 2019-04-16 DIAGNOSIS — M810 Age-related osteoporosis without current pathological fracture: Secondary | ICD-10-CM | POA: Diagnosis not present

## 2019-04-16 DIAGNOSIS — E8809 Other disorders of plasma-protein metabolism, not elsewhere classified: Secondary | ICD-10-CM | POA: Diagnosis not present

## 2019-04-16 DIAGNOSIS — Z0001 Encounter for general adult medical examination with abnormal findings: Secondary | ICD-10-CM | POA: Diagnosis not present

## 2019-04-16 DIAGNOSIS — R911 Solitary pulmonary nodule: Secondary | ICD-10-CM | POA: Diagnosis not present

## 2019-04-16 DIAGNOSIS — E139 Other specified diabetes mellitus without complications: Secondary | ICD-10-CM | POA: Diagnosis not present

## 2019-04-16 DIAGNOSIS — J432 Centrilobular emphysema: Secondary | ICD-10-CM | POA: Diagnosis not present

## 2019-04-16 DIAGNOSIS — I10 Essential (primary) hypertension: Secondary | ICD-10-CM | POA: Diagnosis not present

## 2019-04-16 DIAGNOSIS — E108 Type 1 diabetes mellitus with unspecified complications: Secondary | ICD-10-CM | POA: Diagnosis not present

## 2019-04-16 DIAGNOSIS — I340 Nonrheumatic mitral (valve) insufficiency: Secondary | ICD-10-CM | POA: Diagnosis not present

## 2019-05-05 ENCOUNTER — Other Ambulatory Visit: Payer: 59

## 2019-05-06 ENCOUNTER — Inpatient Hospital Stay: Admission: RE | Admit: 2019-05-06 | Payer: 59 | Source: Ambulatory Visit

## 2019-05-08 ENCOUNTER — Ambulatory Visit: Payer: 59 | Admitting: Cardiology

## 2019-05-21 ENCOUNTER — Other Ambulatory Visit: Payer: 59

## 2019-05-27 ENCOUNTER — Ambulatory Visit: Payer: 59 | Admitting: Cardiology

## 2019-05-29 ENCOUNTER — Inpatient Hospital Stay: Admission: RE | Admit: 2019-05-29 | Payer: 59 | Source: Ambulatory Visit

## 2019-06-03 ENCOUNTER — Other Ambulatory Visit: Payer: Self-pay

## 2019-06-03 ENCOUNTER — Ambulatory Visit
Admission: RE | Admit: 2019-06-03 | Discharge: 2019-06-03 | Disposition: A | Discharge status: Home / Self Care | Payer: Medicare Other | Source: Ambulatory Visit | Attending: Internal Medicine | Admitting: Internal Medicine

## 2019-06-03 ENCOUNTER — Ambulatory Visit (INDEPENDENT_AMBULATORY_CARE_PROVIDER_SITE_OTHER): Payer: Medicare Other

## 2019-06-03 DIAGNOSIS — I071 Rheumatic tricuspid insufficiency: Secondary | ICD-10-CM | POA: Diagnosis not present

## 2019-06-03 DIAGNOSIS — I340 Nonrheumatic mitral (valve) insufficiency: Secondary | ICD-10-CM

## 2019-06-03 DIAGNOSIS — Z1231 Encounter for screening mammogram for malignant neoplasm of breast: Secondary | ICD-10-CM | POA: Diagnosis not present

## 2019-06-03 DIAGNOSIS — I480 Paroxysmal atrial fibrillation: Secondary | ICD-10-CM | POA: Diagnosis not present

## 2019-06-03 NOTE — Progress Notes (Deleted)
Follow up visit  Subjective:   Carol Valenzuela, female    DOB: Jan 03, 1951, 68 y.o.   MRN: [id number]   No chief complaint on file.   *** HPI  68 y.o. *** female with ***  *** Past Medical History:  Diagnosis Date  . Diabetes ([place])   . Hyperthyroidism     *** Past Surgical History:  Procedure Laterality Date  . ankle sx Left    broken ankle repair    *** Social History   Socioeconomic History  . Marital status: Single    Spouse name: Not on file  . Number of children: Not on file  . Years of education: Not on file  . Highest education level: Not on file  Occupational History  . Not on file  Social Needs  . Financial resource strain: Not on file  . Food insecurity    Worry: Not on file    Inability: Not on file  . Transportation needs    Medical: Not on file    Non-medical: Not on file  Tobacco Use  . Smoking status: Current Every Day Smoker    Packs/day: 0.75    Years: 44.00    Pack years: 33.00  . Smokeless tobacco: Never Used  . Tobacco comment: down to 0.5ppd  Substance and Sexual Activity  . Alcohol use: Not on file  . Drug use: Not on file  . Sexual activity: Not on file  Lifestyle  . Physical activity    Days per week: Not on file    Minutes per session: Not on file  . Stress: Not on file  Relationships  . Social Herbalist on phone: Not on file    Gets together: Not on file    Attends religious service: Not on file    Active member of club or organization: Not on file    Attends meetings of clubs or organizations: Not on file    Relationship status: Not on file  . Intimate partner violence    Fear of current or ex partner: Not on file    Emotionally abused: Not on file    Physically abused: Not on file    Forced sexual activity: Not on file  Other Topics Concern  . Not on file  Social History Narrative  . Not on file    *** Family History  Problem Relation Age of Onset  . Brain cancer Father   . Lung cancer Father    . Liver cancer Father   . Alcoholism Father   . Breast cancer Neg Hx     *** Current Outpatient Medications on File Prior to Visit  Medication Sig Dispense Refill  . amLODipine (NORVASC) 5 MG tablet Take 5 mg by mouth daily.    [name] apixaban (ELIQUIS) 5 MG TABS tablet Take 5 mg by mouth 2 (two) times daily.    [name] atorvastatin (LIPITOR) 40 MG tablet TAKE 1 TABLET EVERY DAY 90 tablet 2  . calcium-vitamin D (CALCIUM 500+D HIGH POTENCY) 500-400 MG-UNIT tablet Take 1 tablet by mouth daily.    . cholecalciferol (VITAMIN D) 400 units TABS tablet Take 400 Units by mouth daily.    [name] co-enzyme Q-10 30 MG capsule Take 30 mg by mouth daily.    . furosemide (LASIX) 40 MG tablet Take 40 mg by mouth daily.    . insulin aspart (NOVOLOG FLEXPEN) 100 UNIT/ML FlexPen Inject into the skin 3 (three) times daily with meals.    . Insulin  Degludec (TRESIBA) 100 UNIT/ML SOLN Inject 18 Units into the skin daily.    [name] lisinopril (PRINIVIL,ZESTRIL) 40 MG tablet Take 20 mg by mouth daily.     . magnesium oxide (MAG-OX) 400 MG tablet Take 400 mg by mouth daily.    . metFORMIN (GLUCOPHAGE) 500 MG tablet Take 500 mg by mouth daily with breakfast.    . methimazole (TAPAZOLE) 5 MG tablet Take 5 mg by mouth daily.    . metoprolol succinate (TOPROL-XL) 50 MG 24 hr tablet Take 50 mg by mouth daily. Take with or immediately following a meal.    . multivitamin-iron-minerals-folic acid (CENTRUM) chewable tablet Chew 1 tablet by mouth daily.    . Omega-3 Fatty Acids (FISH OIL) 1000 MG CAPS Take 1 capsule by mouth daily.    . varenicline (CHANTIX PAK) 0.5 MG X 11 & 1 MG X 42 tablet Take by mouth 2 (two) times daily. Take one 0.5 mg tablet by mouth once daily for 3 days, then increase to one 0.5 mg tablet twice daily for 4 days, then increase to one 1 mg tablet twice daily.     No current facility-administered medications on file prior to visit.     Cardiovascular studies:  ***  Echocardiogram 06/03/2019: Left ventricle cavity  is normal in size. Mild concentric hypertrophy of the left ventricle. Normal LV systolic function with EF 69%. Normal global wall motion. Doppler evidence of grade II (pseudonormal) diastolic dysfunction, elevated LAP. Calculated EF 69%. Left atrial cavity is severely dilated. Right atrial cavity is mildly dilated. Trileaflet aortic valve. Mild to moderate aortic regurgitation. Mild (Grade I) mitral regurgitation. Moderate tricuspid regurgitation. Moderate pulmonary hypertension. Estimated pulmonary artery systolic pressure is 50 mmHg.  Estimated RA pressure 10-15 mmHg. Compared to previous study on 11/05/2017, mild increase in pulmonary pressures.   Recent labs: ***  *** ROS      *** There were no vitals filed for this visit.  There is no height or weight on file to calculate BMI. There were no vitals filed for this visit.  *** Objective:   Physical Exam        Assessment & Recommendations:   ***  ***   Manish Esther Hardy, MD [facility] Pager: [phone] Office: [phone] If no answer Cell [phone]

## 2019-06-04 ENCOUNTER — Other Ambulatory Visit: Payer: Self-pay | Admitting: Internal Medicine

## 2019-06-04 ENCOUNTER — Ambulatory Visit: Payer: 59

## 2019-06-04 ENCOUNTER — Other Ambulatory Visit: Payer: Medicare Other

## 2019-06-04 DIAGNOSIS — R928 Other abnormal and inconclusive findings on diagnostic imaging of breast: Secondary | ICD-10-CM

## 2019-06-10 ENCOUNTER — Ambulatory Visit
Admission: RE | Admit: 2019-06-10 | Discharge: 2019-06-10 | Disposition: A | Discharge status: Home / Self Care | Payer: Medicare Other | Source: Ambulatory Visit | Attending: Internal Medicine | Admitting: Internal Medicine

## 2019-06-10 ENCOUNTER — Other Ambulatory Visit: Payer: Self-pay

## 2019-06-10 ENCOUNTER — Other Ambulatory Visit: Payer: Self-pay | Admitting: Internal Medicine

## 2019-06-10 DIAGNOSIS — R928 Other abnormal and inconclusive findings on diagnostic imaging of breast: Secondary | ICD-10-CM

## 2019-06-10 DIAGNOSIS — N6310 Unspecified lump in the right breast, unspecified quadrant: Secondary | ICD-10-CM

## 2019-06-10 DIAGNOSIS — N6489 Other specified disorders of breast: Secondary | ICD-10-CM | POA: Diagnosis not present

## 2019-06-17 ENCOUNTER — Telehealth: Payer: 59 | Admitting: Cardiology

## 2019-06-17 ENCOUNTER — Encounter: Payer: Self-pay | Admitting: Cardiology

## 2019-06-18 DIAGNOSIS — R911 Solitary pulmonary nodule: Secondary | ICD-10-CM | POA: Diagnosis not present

## 2019-06-18 DIAGNOSIS — I10 Essential (primary) hypertension: Secondary | ICD-10-CM | POA: Diagnosis not present

## 2019-06-18 DIAGNOSIS — F17210 Nicotine dependence, cigarettes, uncomplicated: Secondary | ICD-10-CM | POA: Diagnosis not present

## 2019-06-18 DIAGNOSIS — F330 Major depressive disorder, recurrent, mild: Secondary | ICD-10-CM | POA: Diagnosis not present

## 2019-06-18 DIAGNOSIS — Z23 Encounter for immunization: Secondary | ICD-10-CM | POA: Diagnosis not present

## 2019-06-23 DIAGNOSIS — E139 Other specified diabetes mellitus without complications: Secondary | ICD-10-CM | POA: Diagnosis not present

## 2019-06-23 DIAGNOSIS — E118 Type 2 diabetes mellitus with unspecified complications: Secondary | ICD-10-CM | POA: Diagnosis not present

## 2019-06-23 DIAGNOSIS — E0590 Thyrotoxicosis, unspecified without thyrotoxic crisis or storm: Secondary | ICD-10-CM | POA: Diagnosis not present

## 2019-06-23 DIAGNOSIS — I4891 Unspecified atrial fibrillation: Secondary | ICD-10-CM | POA: Diagnosis not present

## 2019-06-23 DIAGNOSIS — I10 Essential (primary) hypertension: Secondary | ICD-10-CM | POA: Diagnosis not present

## 2019-06-23 DIAGNOSIS — Z6834 Body mass index (BMI) 34.0-34.9, adult: Secondary | ICD-10-CM | POA: Diagnosis not present

## 2019-06-23 DIAGNOSIS — R809 Proteinuria, unspecified: Secondary | ICD-10-CM | POA: Diagnosis not present

## 2019-06-23 DIAGNOSIS — F17210 Nicotine dependence, cigarettes, uncomplicated: Secondary | ICD-10-CM | POA: Diagnosis not present

## 2019-06-23 DIAGNOSIS — E785 Hyperlipidemia, unspecified: Secondary | ICD-10-CM | POA: Diagnosis not present

## 2019-06-26 ENCOUNTER — Inpatient Hospital Stay: Admission: RE | Admit: 2019-06-26 | Payer: 59 | Source: Ambulatory Visit

## 2019-06-29 ENCOUNTER — Ambulatory Visit (INDEPENDENT_AMBULATORY_CARE_PROVIDER_SITE_OTHER)
Admission: RE | Admit: 2019-06-29 | Discharge: 2019-06-29 | Disposition: A | Discharge status: Home / Self Care | Payer: Medicare Other | Source: Ambulatory Visit | Attending: Acute Care | Admitting: Acute Care

## 2019-06-29 ENCOUNTER — Other Ambulatory Visit: Payer: Self-pay

## 2019-06-29 ENCOUNTER — Ambulatory Visit (INDEPENDENT_AMBULATORY_CARE_PROVIDER_SITE_OTHER): Payer: Medicare Other | Admitting: Cardiology

## 2019-06-29 ENCOUNTER — Encounter: Payer: Self-pay | Admitting: Cardiology

## 2019-06-29 VITALS — BP 163/95 | HR 58 | Temp 97.3°F | Ht 65.0 in | Wt 172.0 lb

## 2019-06-29 DIAGNOSIS — I10 Essential (primary) hypertension: Secondary | ICD-10-CM

## 2019-06-29 DIAGNOSIS — Z122 Encounter for screening for malignant neoplasm of respiratory organs: Secondary | ICD-10-CM

## 2019-06-29 DIAGNOSIS — I272 Pulmonary hypertension, unspecified: Secondary | ICD-10-CM | POA: Diagnosis not present

## 2019-06-29 DIAGNOSIS — I480 Paroxysmal atrial fibrillation: Secondary | ICD-10-CM

## 2019-06-29 DIAGNOSIS — Z87891 Personal history of nicotine dependence: Secondary | ICD-10-CM | POA: Diagnosis not present

## 2019-06-29 DIAGNOSIS — F17210 Nicotine dependence, cigarettes, uncomplicated: Secondary | ICD-10-CM

## 2019-06-29 MED ORDER — AMLODIPINE BESYLATE 5 MG PO TABS: 5.0000 mg | ORAL_TABLET | Freq: Every day | ORAL | 3 refills | Status: DC

## 2019-06-29 NOTE — Patient Instructions (Signed)
Amlodipine 5 mg in am Lisinopril 20 mg in am Metoprolol succinate 50 mg in pm Lipitor 40 mg in pm Eliquis 5 mg twice daily

## 2019-06-29 NOTE — Progress Notes (Signed)
Follow up visit  Subjective:   Carol Valenzuela, female    DOB: 1951/04/25, 68 y.o.   MRN: [id number]   Chief Complaint  Patient presents with  . Hypertension     HPI  68 y/o Caucasian female with hypertension, type II diabetes mellitus, paroxysmal atrial fibrillation, tobacco abuse, here for follow-up.  Patient is not taking amlodipine 5 mg, which was previously prescribed. Also, she is taking lisinopril 20 mg, unlike 40 mg previously recommended. I think this is only due to confusion. Blood pressure elevated today. She has been under a lot of stress related to renovation at home. She was started on an antidepressant by PCP recently. Recent blood work not available to me.   Recent echocardiogram reviewed with the patient.    Past Medical History:  Diagnosis Date  . Diabetes ([place])   . Hyperthyroidism      Past Surgical History:  Procedure Laterality Date  . ankle sx Left    broken ankle repair     Social History   Socioeconomic History  . Marital status: Single    Spouse name: Not on file  . Number of children: 0  . Years of education: Not on file  . Highest education level: Not on file  Occupational History  . Not on file  Social Needs  . Financial resource strain: Not on file  . Food insecurity    Worry: Not on file    Inability: Not on file  . Transportation needs    Medical: Not on file    Non-medical: Not on file  Tobacco Use  . Smoking status: Current Every Day Smoker    Packs/day: 0.75    Years: 44.00    Pack years: 33.00  . Smokeless tobacco: Never Used  . Tobacco comment: down to 0.5ppd  Substance and Sexual Activity  . Alcohol use: Not Currently  . Drug use: Not Currently  . Sexual activity: Not on file  Lifestyle  . Physical activity    Days per week: Not on file    Minutes per session: Not on file  . Stress: Not on file  Relationships  . Social Herbalist on phone: Not on file    Gets together: Not on file    Attends  religious service: Not on file    Active member of club or organization: Not on file    Attends meetings of clubs or organizations: Not on file    Relationship status: Not on file  . Intimate partner violence    Fear of current or ex partner: Not on file    Emotionally abused: Not on file    Physically abused: Not on file    Forced sexual activity: Not on file  Other Topics Concern  . Not on file  Social History Narrative  . Not on file     Family History  Problem Relation Age of Onset  . Brain cancer Father   . Lung cancer Father   . Liver cancer Father   . Alcoholism Father   . Breast cancer Neg Hx      Current Outpatient Medications on File Prior to Visit  Medication Sig Dispense Refill  . apixaban (ELIQUIS) 5 MG TABS tablet Take 5 mg by mouth 2 (two) times daily.    [name] atorvastatin (LIPITOR) 40 MG tablet TAKE 1 TABLET EVERY DAY 90 tablet 2  . calcium-vitamin D (CALCIUM 500+D HIGH POTENCY) 500-400 MG-UNIT tablet Take 1 tablet by mouth daily.    [name]  cholecalciferol (VITAMIN D) 400 units TABS tablet Take 1,000 Units by mouth daily.     . insulin aspart (NOVOLOG FLEXPEN) 100 UNIT/ML FlexPen Inject into the skin 3 (three) times daily with meals.    . Insulin Degludec (TRESIBA) 100 UNIT/ML SOLN Inject 12 Units into the skin daily.    [name] lisinopril (PRINIVIL,ZESTRIL) 40 MG tablet Take 20 mg by mouth daily.     . metFORMIN (GLUCOPHAGE) 500 MG tablet Take 500 mg by mouth 2 (two) times daily.    . methimazole (TAPAZOLE) 5 MG tablet Take 5 mg by mouth daily.    . metoprolol succinate (TOPROL-XL) 50 MG 24 hr tablet Take 50 mg by mouth daily. Take with or immediately following a meal.    . multivitamin-iron-minerals-folic acid (CENTRUM) chewable tablet Chew 1 tablet by mouth daily.    . Omega-3 Fatty Acids (FISH OIL) 1000 MG CAPS Take 1 capsule by mouth daily.     No current facility-administered medications on file prior to visit.     Cardiovascular studies:  EKG 06/29/2019: Sinus  rhythm 68 bpm. Right bundle branch block.  Left atrial enlargement.    Echocardiogram 06/03/2019: Left ventricle cavity is normal in size. Mild concentric hypertrophy of the left ventricle. Normal LV systolic function with EF 69%. Normal global wall motion. Doppler evidence of grade II (pseudonormal) diastolic dysfunction, elevated LAP. Calculated EF 69%. Left atrial cavity is severely dilated. Right atrial cavity is mildly dilated. Trileaflet aortic valve. Mild to moderate aortic regurgitation. Mild (Grade I) mitral regurgitation. Moderate tricuspid regurgitation. Moderate pulmonary hypertension. Estimated pulmonary artery systolic pressure is 50 mmHg.  Estimated RA pressure 10-15 mmHg. Compared to previous study on 11/05/2017, mild increase in pulmonary pressures.   Exercise myoview stress 12/09/2017:  1. The patient performed treadmill exercise using a Bruce protocol, completing 5:00 minutes. The patient completed an estimated workload of 7.03 METS, reaching 100% of the maximum predicted heart rate. Exercise capacity is low for age. The patient did not develop symptoms other than fatigue during the procedure. Resting hypertension with exaggerated exercise response, peak BP 218/106 mmHg. Exercise electrocardiogram is negative for ischemia. 2. The overall quality of the study is good. There is no evidence of abnormal lung activity. Stress and rest SPECT images demonstrate homogeneous tracer distribution throughout the myocardium. Gated SPECT imaging reveals normal myocardial thickening and wall motion. The left ventricular ejection fraction was normal (50%).   3. Low risk study.  Recent labs: 04/03/2018: Glucose 167.BUN/Cr 66/1.7. eGFR 32. Na/K 136/4.8   Review of Systems  Constitution: Negative for decreased appetite, malaise/fatigue, weight gain and weight loss.  HENT: Negative for congestion.   Eyes: Negative for visual disturbance.  Cardiovascular: Negative for chest pain, dyspnea on  exertion, leg swelling, palpitations and syncope.  Respiratory: Negative for cough.   Endocrine: Negative for cold intolerance.  Hematologic/Lymphatic: Does not bruise/bleed easily.  Skin: Negative for itching and rash.  Musculoskeletal: Negative for myalgias.  Gastrointestinal: Negative for abdominal pain, nausea and vomiting.  Genitourinary: Negative for dysuria.  Neurological: Negative for dizziness and weakness.  Psychiatric/Behavioral: The patient is nervous/anxious.   All other systems reviewed and are negative.        Vitals:   06/29/19 1606 06/29/19 1702  BP: (!) 189/93 (!) 163/95  Pulse: 67 (!) 58  Temp:    SpO2:      Body mass index is 28.62 kg/m. Filed Weights   06/29/19 1600  Weight: 172 lb (78 kg)     Objective:   Physical  Exam  Constitutional: She is oriented to person, place, and time. She appears well-developed and well-nourished. No distress.  HENT:  Head: Normocephalic and atraumatic.  Eyes: Pupils are equal, round, and reactive to light. Conjunctivae are normal.  Neck: No JVD present.  Cardiovascular: Normal rate, regular rhythm and intact distal pulses.  Pulmonary/Chest: Effort normal and breath sounds normal. She has no wheezes. She has no rales.  Abdominal: Soft. Bowel sounds are normal. There is no rebound.  Musculoskeletal:        General: No edema.  Lymphadenopathy:    She has no cervical adenopathy.  Neurological: She is alert and oriented to person, place, and time. No cranial nerve deficit.  Skin: Skin is warm and dry.  Psychiatric: She has a normal mood and affect.  Nursing note and vitals reviewed.         Assessment & Recommendations:   68 y/o Caucasian female with hypertension, type II diabetes mellitus, paroxysmal atrial fibrillation, tobacco abuse, here for follow-up.   Paroxysmal atrial fibrillation: In sinus rhythm today. Continue anticoagulation with Eliquis 5 mg twice q.d. for CHADSVASc Score of 5. Continue  metoprolol XL 50 mg once daily.  Hypertension: Suboptimal control. Added amlodipine 5 mg today.  MR, TR, pulmonary hypertension: Likely driven by hypertnesion, as well as paroxysmal Afib. 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 Recommend aggressive hypertension management at this time.  Type II diabetes mellitus: Well controlled. Followed by endocrinologist Dr. Garnet Koyanagi.  F/u in 4 weeks.    Nigel Mormon, MD [facility] Pager: [phone] Office: [phone] If no answer Cell [phone]

## 2019-07-02 ENCOUNTER — Other Ambulatory Visit: Payer: Self-pay | Admitting: *Deleted

## 2019-07-02 DIAGNOSIS — Z122 Encounter for screening for malignant neoplasm of respiratory organs: Secondary | ICD-10-CM

## 2019-07-02 DIAGNOSIS — F17210 Nicotine dependence, cigarettes, uncomplicated: Secondary | ICD-10-CM

## 2019-07-09 ENCOUNTER — Ambulatory Visit: Payer: 59

## 2019-07-30 ENCOUNTER — Ambulatory Visit: Payer: 59 | Admitting: Cardiology

## 2019-08-20 ENCOUNTER — Other Ambulatory Visit: Payer: Self-pay

## 2019-08-20 ENCOUNTER — Ambulatory Visit (INDEPENDENT_AMBULATORY_CARE_PROVIDER_SITE_OTHER): Payer: Medicare Other | Admitting: Cardiology

## 2019-08-20 ENCOUNTER — Encounter: Payer: Self-pay | Admitting: Cardiology

## 2019-08-20 VITALS — BP 147/73 | HR 73 | Ht 66.0 in | Wt 170.6 lb

## 2019-08-20 DIAGNOSIS — I480 Paroxysmal atrial fibrillation: Secondary | ICD-10-CM

## 2019-08-20 DIAGNOSIS — I10 Essential (primary) hypertension: Secondary | ICD-10-CM | POA: Diagnosis not present

## 2019-08-20 DIAGNOSIS — I272 Pulmonary hypertension, unspecified: Secondary | ICD-10-CM

## 2019-08-20 NOTE — Progress Notes (Signed)
Follow up visit  Subjective:   Carol Valenzuela, female    DOB: 10/26/1950, 68 y.o.   MRN: [id number]  Chief Complaint  Patient presents with  . Hypertension  . Atrial Fibrillation  . 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  Blood pressure has improved on amlodipine 5 mg daily. She is wearing compression stockings, leg edema is better. She received refill for lisinopril 40 mg instead of 20 mg, and asks if she could take 40 mg. Her fatigue is better after starting antidepressant.  Past Medical History:  Diagnosis Date  . Diabetes ([place])   . Hyperlipidemia   . Hypertension   . Hyperthyroidism      Past Surgical History:  Procedure Laterality Date  . ankle sx Left    broken ankle repair     Social History   Socioeconomic History  . Marital status: Single    Spouse name: Not on file  . Number of children: 0  . Years of education: Not on file  . Highest education level: Not on file  Occupational History  . Not on file  Social Needs  . Financial resource strain: Not on file  . Food insecurity    Worry: Not on file    Inability: Not on file  . Transportation needs    Medical: Not on file    Non-medical: Not on file  Tobacco Use  . Smoking status: Current Every Day Smoker    Packs/day: 0.75    Years: 44.00    Pack years: 33.00  . Smokeless tobacco: Never Used  . Tobacco comment: down to 0.5ppd  Substance and Sexual Activity  . Alcohol use: Not Currently  . Drug use: Not Currently  . Sexual activity: Not on file  Lifestyle  . Physical activity    Days per week: Not on file    Minutes per session: Not on file  . Stress: Not on file  Relationships  . Social Herbalist on phone: Not on file    Gets together: Not on file    Attends religious service: Not on file    Active member of club or organization: Not on file   Attends meetings of clubs or organizations: Not on file    Relationship status: Not on file  . Intimate partner violence    Fear of current or ex partner: Not on file    Emotionally abused: Not on file    Physically abused: Not on file    Forced sexual activity: Not on file  Other Topics Concern  . Not on file  Social History Narrative  . Not on file     Family History  Problem Relation Age of Onset  . Brain cancer Father   . Lung cancer Father   . Liver cancer Father   . Alcoholism Father   . Breast cancer Neg Hx      Current Outpatient Medications on File Prior to Visit  Medication Sig Dispense Refill  . amLODipine (NORVASC) 5 MG tablet Take 1 tablet (5 mg total) by mouth daily. 30 tablet 3  . apixaban (ELIQUIS) 5 MG TABS tablet Take 5 mg by mouth 2 (two) times daily.    [name] atorvastatin (LIPITOR) 40 MG tablet TAKE 1 TABLET EVERY DAY 90 tablet 2  . calcium-vitamin D (CALCIUM 500+D HIGH POTENCY) 500-400 MG-UNIT tablet Take 1 tablet by  mouth daily.    . cholecalciferol (VITAMIN D) 400 units TABS tablet Take 1,000 Units by mouth daily.     . insulin aspart (NOVOLOG FLEXPEN) 100 UNIT/ML FlexPen Inject into the skin 3 (three) times daily with meals.    . Insulin Degludec (TRESIBA) 100 UNIT/ML SOLN Inject 12 Units into the skin daily.    [name] lisinopril (PRINIVIL,ZESTRIL) 40 MG tablet Take 20 mg by mouth daily.     . metFORMIN (GLUCOPHAGE) 500 MG tablet Take 500 mg by mouth 2 (two) times daily.    . methimazole (TAPAZOLE) 5 MG tablet Take 5 mg by mouth daily.    . metoprolol succinate (TOPROL-XL) 50 MG 24 hr tablet Take 50 mg by mouth daily. Take with or immediately following a meal.    . multivitamin-iron-minerals-folic acid (CENTRUM) chewable tablet Chew 1 tablet by mouth daily.    . Omega-3 Fatty Acids (FISH OIL) 1000 MG CAPS Take 1 capsule by mouth daily.     No current facility-administered medications on file prior to visit.     Cardiovascular studies:  EKG 06/29/2019:  Sinus rhythm 68 bpm. Right bundle branch block.  Left atrial enlargement.    Echocardiogram 06/03/2019: Left ventricle cavity is normal in size. Mild concentric hypertrophy of the left ventricle. Normal LV systolic function with EF 69%. Normal global wall motion. Doppler evidence of grade II (pseudonormal) diastolic dysfunction, elevated LAP. Calculated EF 69%. Left atrial cavity is severely dilated. Right atrial cavity is mildly dilated. Trileaflet aortic valve. Mild to moderate aortic regurgitation. Mild (Grade I) mitral regurgitation. Moderate tricuspid regurgitation. Moderate pulmonary hypertension. Estimated pulmonary artery systolic pressure is 50 mmHg.  Estimated RA pressure 10-15 mmHg. Compared to previous study on 11/05/2017, mild increase in pulmonary pressures.   Exercise myoview stress 12/09/2017:  1. The patient performed treadmill exercise using a Bruce protocol, completing 5:00 minutes. The patient completed an estimated workload of 7.03 METS, reaching 100% of the maximum predicted heart rate. Exercise capacity is low for age. The patient did not develop symptoms other than fatigue during the procedure. Resting hypertension with exaggerated exercise response, peak BP 218/106 mmHg. Exercise electrocardiogram is negative for ischemia. 2. The overall quality of the study is good. There is no evidence of abnormal lung activity. Stress and rest SPECT images demonstrate homogeneous tracer distribution throughout the myocardium. Gated SPECT imaging reveals normal myocardial thickening and wall motion. The left ventricular ejection fraction was normal (50%).   3. Low risk study.  Recent labs: 04/13/2019: Glucose 190, BUN/Cr 14/0.9. EGFR 68. Na/K 139/4.3. Albumin 3.3, total protein 6.3. Rest of the CMP normal H/H 13/39. MCV 90. Platelets 213 HbA1C 9.7% Chol 195, TG 82, HDL 90, LDL 89 TSH 3.7 normal   04/03/2018: Glucose 167.BUN/Cr 66/1.7. eGFR 32. Na/K 136/4.8   Review of Systems   Constitution: Negative for decreased appetite, malaise/fatigue, weight gain and weight loss.  HENT: Negative for congestion.   Eyes: Negative for visual disturbance.  Cardiovascular: Negative for chest pain, dyspnea on exertion, leg swelling, palpitations and syncope.  Respiratory: Negative for cough.   Endocrine: Negative for cold intolerance.  Hematologic/Lymphatic: Does not bruise/bleed easily.  Skin: Negative for itching and rash.  Musculoskeletal: Negative for myalgias.  Gastrointestinal: Negative for abdominal pain, nausea and vomiting.  Genitourinary: Negative for dysuria.  Neurological: Negative for dizziness and weakness.  Psychiatric/Behavioral: The patient is nervous/anxious.   All other systems reviewed and are negative.        Vitals:   08/20/19 1350  BP: (!) 147/73  Pulse: 73  SpO2: 97%     Body mass index is 27.54 kg/m. Filed Weights   08/20/19 1350  Weight: 170 lb 9.6 oz (77.4 kg)     Objective:   Physical Exam  Constitutional: She is oriented to person, place, and time. She appears well-developed and well-nourished. No distress.  HENT:  Head: Normocephalic and atraumatic.  Eyes: Pupils are equal, round, and reactive to light. Conjunctivae are normal.  Neck: No JVD present.  Cardiovascular: Normal rate, regular rhythm and intact distal pulses.  Murmur heard.  Holosystolic murmur is present with a grade of 3/6 at the lower right sternal border. Pulmonary/Chest: Effort normal and breath sounds normal. She has no wheezes. She has no rales.  Abdominal: Soft. Bowel sounds are normal. There is no rebound.  Musculoskeletal:        General: No edema.  Lymphadenopathy:    She has no cervical adenopathy.  Neurological: She is alert and oriented to person, place, and time. No cranial nerve deficit.  Skin: Skin is warm and dry.  Psychiatric: She has a normal mood and affect.  Nursing note and vitals reviewed.         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  Paroxysmal atrial fibrillation: In sinus rhythm today. Continue anticoagulation with Eliquis 5 mg twice daily for CHADSVASc Score of 5. Continue metoprolol XL 50 mg once daily.  Hypertension: Suboptimal , but improved control. Okay to take lisinopril 40 mg instead of 20 mg. Recommend checking BMP with PCP in 2 weeks.   MR, TR, pulmonary hypertension: Likely driven by hypertnesion, as well as paroxysmal Afib. 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 Recommend aggressive hypertension management at this time.  Type II diabetes mellitus: Well controlled. Followed by endocrinologist Dr. Garnet Koyanagi.  Repeat echo in March 2021. F/u after that.   Nigel Mormon, MD [facility] Pager: [phone] Office: [phone] If no answer Cell [phone]

## 2019-09-09 ENCOUNTER — Other Ambulatory Visit: Payer: Self-pay | Admitting: Cardiology

## 2019-09-15 ENCOUNTER — Other Ambulatory Visit: Payer: Self-pay | Admitting: Cardiology

## 2019-09-15 DIAGNOSIS — I272 Pulmonary hypertension, unspecified: Secondary | ICD-10-CM

## 2019-09-15 DIAGNOSIS — I480 Paroxysmal atrial fibrillation: Secondary | ICD-10-CM

## 2019-10-01 DIAGNOSIS — I10 Essential (primary) hypertension: Secondary | ICD-10-CM | POA: Diagnosis not present

## 2019-10-01 DIAGNOSIS — E785 Hyperlipidemia, unspecified: Secondary | ICD-10-CM | POA: Diagnosis not present

## 2019-10-01 DIAGNOSIS — Z79899 Other long term (current) drug therapy: Secondary | ICD-10-CM | POA: Diagnosis not present

## 2019-10-01 DIAGNOSIS — F17210 Nicotine dependence, cigarettes, uncomplicated: Secondary | ICD-10-CM | POA: Diagnosis not present

## 2019-10-01 DIAGNOSIS — E139 Other specified diabetes mellitus without complications: Secondary | ICD-10-CM | POA: Diagnosis not present

## 2019-10-01 DIAGNOSIS — E0590 Thyrotoxicosis, unspecified without thyrotoxic crisis or storm: Secondary | ICD-10-CM | POA: Diagnosis not present

## 2019-10-01 DIAGNOSIS — R809 Proteinuria, unspecified: Secondary | ICD-10-CM | POA: Diagnosis not present

## 2019-10-01 DIAGNOSIS — I4891 Unspecified atrial fibrillation: Secondary | ICD-10-CM | POA: Diagnosis not present

## 2019-10-01 DIAGNOSIS — Z6834 Body mass index (BMI) 34.0-34.9, adult: Secondary | ICD-10-CM | POA: Diagnosis not present

## 2019-10-08 DIAGNOSIS — I4891 Unspecified atrial fibrillation: Secondary | ICD-10-CM | POA: Diagnosis not present

## 2019-10-08 DIAGNOSIS — E139 Other specified diabetes mellitus without complications: Secondary | ICD-10-CM | POA: Diagnosis not present

## 2019-10-08 DIAGNOSIS — F339 Major depressive disorder, recurrent, unspecified: Secondary | ICD-10-CM | POA: Diagnosis not present

## 2019-10-08 DIAGNOSIS — I10 Essential (primary) hypertension: Secondary | ICD-10-CM | POA: Diagnosis not present

## 2019-10-08 DIAGNOSIS — E785 Hyperlipidemia, unspecified: Secondary | ICD-10-CM | POA: Diagnosis not present

## 2019-10-08 DIAGNOSIS — E0590 Thyrotoxicosis, unspecified without thyrotoxic crisis or storm: Secondary | ICD-10-CM | POA: Diagnosis not present

## 2019-10-24 DIAGNOSIS — Z23 Encounter for immunization: Secondary | ICD-10-CM | POA: Diagnosis not present

## 2019-10-30 ENCOUNTER — Other Ambulatory Visit: Payer: Self-pay | Admitting: Cardiology

## 2019-11-11 DIAGNOSIS — I4891 Unspecified atrial fibrillation: Secondary | ICD-10-CM | POA: Diagnosis not present

## 2019-11-11 DIAGNOSIS — I10 Essential (primary) hypertension: Secondary | ICD-10-CM | POA: Diagnosis not present

## 2019-11-11 DIAGNOSIS — E785 Hyperlipidemia, unspecified: Secondary | ICD-10-CM | POA: Diagnosis not present

## 2019-11-11 DIAGNOSIS — E139 Other specified diabetes mellitus without complications: Secondary | ICD-10-CM | POA: Diagnosis not present

## 2019-11-11 DIAGNOSIS — E0590 Thyrotoxicosis, unspecified without thyrotoxic crisis or storm: Secondary | ICD-10-CM | POA: Diagnosis not present

## 2019-11-11 DIAGNOSIS — F339 Major depressive disorder, recurrent, unspecified: Secondary | ICD-10-CM | POA: Diagnosis not present

## 2019-11-12 DIAGNOSIS — R809 Proteinuria, unspecified: Secondary | ICD-10-CM | POA: Diagnosis not present

## 2019-11-12 DIAGNOSIS — E785 Hyperlipidemia, unspecified: Secondary | ICD-10-CM | POA: Diagnosis not present

## 2019-11-12 DIAGNOSIS — Z6834 Body mass index (BMI) 34.0-34.9, adult: Secondary | ICD-10-CM | POA: Diagnosis not present

## 2019-11-12 DIAGNOSIS — E118 Type 2 diabetes mellitus with unspecified complications: Secondary | ICD-10-CM | POA: Diagnosis not present

## 2019-11-12 DIAGNOSIS — E0590 Thyrotoxicosis, unspecified without thyrotoxic crisis or storm: Secondary | ICD-10-CM | POA: Diagnosis not present

## 2019-11-12 DIAGNOSIS — I4891 Unspecified atrial fibrillation: Secondary | ICD-10-CM | POA: Diagnosis not present

## 2019-11-12 DIAGNOSIS — E139 Other specified diabetes mellitus without complications: Secondary | ICD-10-CM | POA: Diagnosis not present

## 2019-11-12 DIAGNOSIS — I10 Essential (primary) hypertension: Secondary | ICD-10-CM | POA: Diagnosis not present

## 2019-11-12 DIAGNOSIS — F17210 Nicotine dependence, cigarettes, uncomplicated: Secondary | ICD-10-CM | POA: Diagnosis not present

## 2019-11-21 DIAGNOSIS — Z23 Encounter for immunization: Secondary | ICD-10-CM | POA: Diagnosis not present

## 2019-12-01 DIAGNOSIS — I480 Paroxysmal atrial fibrillation: Secondary | ICD-10-CM | POA: Diagnosis not present

## 2019-12-01 DIAGNOSIS — E139 Other specified diabetes mellitus without complications: Secondary | ICD-10-CM | POA: Diagnosis not present

## 2019-12-01 DIAGNOSIS — E785 Hyperlipidemia, unspecified: Secondary | ICD-10-CM | POA: Diagnosis not present

## 2019-12-01 DIAGNOSIS — E032 Hypothyroidism due to medicaments and other exogenous substances: Secondary | ICD-10-CM | POA: Diagnosis not present

## 2019-12-01 DIAGNOSIS — F339 Major depressive disorder, recurrent, unspecified: Secondary | ICD-10-CM | POA: Diagnosis not present

## 2019-12-01 DIAGNOSIS — Z79899 Other long term (current) drug therapy: Secondary | ICD-10-CM | POA: Diagnosis not present

## 2019-12-01 DIAGNOSIS — I10 Essential (primary) hypertension: Secondary | ICD-10-CM | POA: Diagnosis not present

## 2019-12-09 ENCOUNTER — Other Ambulatory Visit: Payer: 59

## 2019-12-18 ENCOUNTER — Other Ambulatory Visit: Payer: Self-pay | Admitting: Internal Medicine

## 2019-12-18 ENCOUNTER — Ambulatory Visit
Admission: RE | Admit: 2019-12-18 | Discharge: 2019-12-18 | Disposition: A | Discharge status: Home / Self Care | Payer: Medicare Other | Source: Ambulatory Visit | Attending: Internal Medicine | Admitting: Internal Medicine

## 2019-12-18 ENCOUNTER — Other Ambulatory Visit: Payer: Self-pay

## 2019-12-18 DIAGNOSIS — N6311 Unspecified lump in the right breast, upper outer quadrant: Secondary | ICD-10-CM | POA: Diagnosis not present

## 2019-12-18 DIAGNOSIS — N6310 Unspecified lump in the right breast, unspecified quadrant: Secondary | ICD-10-CM

## 2019-12-18 DIAGNOSIS — R928 Other abnormal and inconclusive findings on diagnostic imaging of breast: Secondary | ICD-10-CM | POA: Diagnosis not present

## 2019-12-18 DIAGNOSIS — N6313 Unspecified lump in the right breast, lower outer quadrant: Secondary | ICD-10-CM | POA: Diagnosis not present

## 2019-12-24 ENCOUNTER — Other Ambulatory Visit: Payer: 59

## 2019-12-24 DIAGNOSIS — E032 Hypothyroidism due to medicaments and other exogenous substances: Secondary | ICD-10-CM | POA: Diagnosis not present

## 2019-12-24 DIAGNOSIS — E139 Other specified diabetes mellitus without complications: Secondary | ICD-10-CM | POA: Diagnosis not present

## 2019-12-24 DIAGNOSIS — I480 Paroxysmal atrial fibrillation: Secondary | ICD-10-CM | POA: Diagnosis not present

## 2019-12-24 DIAGNOSIS — F339 Major depressive disorder, recurrent, unspecified: Secondary | ICD-10-CM | POA: Diagnosis not present

## 2019-12-24 DIAGNOSIS — I10 Essential (primary) hypertension: Secondary | ICD-10-CM | POA: Diagnosis not present

## 2019-12-24 DIAGNOSIS — Z79899 Other long term (current) drug therapy: Secondary | ICD-10-CM | POA: Diagnosis not present

## 2019-12-24 DIAGNOSIS — E785 Hyperlipidemia, unspecified: Secondary | ICD-10-CM | POA: Diagnosis not present

## 2019-12-30 ENCOUNTER — Other Ambulatory Visit: Payer: Self-pay

## 2019-12-30 ENCOUNTER — Other Ambulatory Visit: Payer: Self-pay | Admitting: Cardiology

## 2019-12-30 DIAGNOSIS — I10 Essential (primary) hypertension: Secondary | ICD-10-CM | POA: Diagnosis not present

## 2019-12-30 DIAGNOSIS — I272 Pulmonary hypertension, unspecified: Secondary | ICD-10-CM

## 2019-12-30 DIAGNOSIS — E785 Hyperlipidemia, unspecified: Secondary | ICD-10-CM | POA: Diagnosis not present

## 2019-12-30 DIAGNOSIS — E0590 Thyrotoxicosis, unspecified without thyrotoxic crisis or storm: Secondary | ICD-10-CM | POA: Diagnosis not present

## 2019-12-30 DIAGNOSIS — E118 Type 2 diabetes mellitus with unspecified complications: Secondary | ICD-10-CM | POA: Diagnosis not present

## 2019-12-30 DIAGNOSIS — Z6834 Body mass index (BMI) 34.0-34.9, adult: Secondary | ICD-10-CM | POA: Diagnosis not present

## 2019-12-30 DIAGNOSIS — F17210 Nicotine dependence, cigarettes, uncomplicated: Secondary | ICD-10-CM | POA: Diagnosis not present

## 2019-12-30 DIAGNOSIS — I4891 Unspecified atrial fibrillation: Secondary | ICD-10-CM | POA: Diagnosis not present

## 2019-12-30 DIAGNOSIS — E139 Other specified diabetes mellitus without complications: Secondary | ICD-10-CM | POA: Diagnosis not present

## 2019-12-30 DIAGNOSIS — R809 Proteinuria, unspecified: Secondary | ICD-10-CM | POA: Diagnosis not present

## 2019-12-31 ENCOUNTER — Ambulatory Visit: Payer: 59 | Admitting: Cardiology

## 2019-12-31 ENCOUNTER — Other Ambulatory Visit: Payer: Self-pay

## 2019-12-31 ENCOUNTER — Ambulatory Visit: Payer: Medicare Other

## 2019-12-31 DIAGNOSIS — I272 Pulmonary hypertension, unspecified: Secondary | ICD-10-CM | POA: Diagnosis not present

## 2020-01-15 ENCOUNTER — Ambulatory Visit: Payer: 59 | Admitting: Cardiology

## 2020-01-21 DIAGNOSIS — I10 Essential (primary) hypertension: Secondary | ICD-10-CM | POA: Diagnosis not present

## 2020-01-21 DIAGNOSIS — E139 Other specified diabetes mellitus without complications: Secondary | ICD-10-CM | POA: Diagnosis not present

## 2020-02-18 ENCOUNTER — Other Ambulatory Visit: Payer: Self-pay

## 2020-02-18 ENCOUNTER — Ambulatory Visit: Payer: Medicare Other | Admitting: Cardiology

## 2020-02-18 ENCOUNTER — Encounter: Payer: Self-pay | Admitting: Cardiology

## 2020-02-18 VITALS — BP 144/74 | HR 63 | Resp 16 | Ht 66.0 in | Wt 164.0 lb

## 2020-02-18 DIAGNOSIS — I10 Essential (primary) hypertension: Secondary | ICD-10-CM

## 2020-02-18 DIAGNOSIS — I272 Pulmonary hypertension, unspecified: Secondary | ICD-10-CM

## 2020-02-18 DIAGNOSIS — I480 Paroxysmal atrial fibrillation: Secondary | ICD-10-CM | POA: Diagnosis not present

## 2020-02-18 NOTE — Progress Notes (Signed)
Follow up visit  Subjective:   Carol Valenzuela, female    DOB: 1951-07-11, 69 y.o.   MRN: [id number]  HPI  Chief Complaint  Patient presents with  . Hypertension  . Follow-up  . 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  She does not have any new symptoms. Blood pressure is better controlled. Diabetes remains uncontrolled.   Current Outpatient Medications on File Prior to Visit  Medication Sig Dispense Refill  . amLODipine (NORVASC) 10 MG tablet Take 1 tablet by mouth daily.    [name] apixaban (ELIQUIS) 5 MG TABS tablet Take 5 mg by mouth 2 (two) times daily.    [name] atorvastatin (LIPITOR) 40 MG tablet TAKE 1 TABLET EVERY DAY 90 tablet 2  . calcium-vitamin D (CALCIUM 500+D HIGH POTENCY) 500-400 MG-UNIT tablet Take 1 tablet by mouth daily.    . cholecalciferol (VITAMIN D) 400 units TABS tablet Take 1,000 Units by mouth daily.     . insulin aspart (NOVOLOG FLEXPEN) 100 UNIT/ML FlexPen Inject into the skin 3 (three) times daily with meals.    . Insulin Degludec (TRESIBA) 100 UNIT/ML SOLN Inject 12 Units into the skin daily.    [name] lisinopril (PRINIVIL,ZESTRIL) 40 MG tablet Take 20 mg by mouth daily.     . metFORMIN (GLUCOPHAGE) 500 MG tablet Take 500 mg by mouth 2 (two) times daily.    . methimazole (TAPAZOLE) 5 MG tablet Take 5 mg by mouth daily.    . metoprolol succinate (TOPROL-XL) 50 MG 24 hr tablet Take 50 mg by mouth daily. Take with or immediately following a meal.    . multivitamin-iron-minerals-folic acid (CENTRUM) chewable tablet Chew 1 tablet by mouth daily.    . Omega-3 Fatty Acids (FISH OIL) 1000 MG CAPS Take 1 capsule by mouth daily.     No current facility-administered medications on file prior to visit.    Cardiovascular & other pertient studies:  EKG 02/18/2020: Sinus rhythm 64 bpm Right bundle branch block.   Echocardiogram 12/31/2019:  Normal  LV systolic function with visual EF 60-65%. Left ventricle cavity  is normal in size. Mild left ventricular hypertrophy. Normal global wall  motion. Doppler evidence of grade II diastolic dysfunction. Calculated EF  66%.  Left atrial cavity is severely dilated, 87m/m2.  Right atrial cavity is dilated.  No evidence of aortic stenosis. Mild aortic regurgitation.  Mild to moderate mitral regurgitation.  Moderate tricuspid regurgitation. Moderate pulmonary hypertension. RVSP  measures 51 mmHg.  IVC is dilated with a respiratory response of <50%.  No significant change compared to previous study on 06/03/2019.  Recent labs: 12/24/2019: BUN/Cr 21/1.6. eGFR 65. HbA1C 9.8%  04/13/2019: Glucose 190, BUN/Cr 14/0.9. EGFR 68. Na/K 139/4.3. Albumin 3.3, total protein 6.3. Rest of the CMP normal H/H 13/39. MCV 90. Platelets 213 HbA1C 9.7% Chol 195, TG 82, HDL 90, LDL 89 TSH 3.7 normal   Review of Systems  Constitution: Positive for malaise/fatigue (Feels "old and slow").  Cardiovascular: Negative for chest pain, dyspnea on exertion, leg swelling, palpitations and syncope.         Vitals:   02/18/20 1152  BP: (!) 144/74  Pulse: 63  Resp: 16  SpO2: 99%     Body mass index is 26.47 kg/m. Filed Weights   02/18/20 1152  Weight: 164 lb (74.4 kg)     Objective:   Physical Exam  Constitutional: No  distress.  Neck: No JVD present.  Cardiovascular: Normal rate, regular rhythm, normal heart sounds and intact distal pulses.  No murmur heard. Pulmonary/Chest: Effort normal and breath sounds normal. She has no wheezes. She has no rales.  Musculoskeletal:        General: No edema.  Nursing note and vitals reviewed.         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   Paroxysmal atrial fibrillation: In sinus rhythm today. Continue  anticoagulation with Eliquis 5 mg twice daily for CHADSVASc Score of 5. Continue metoprolol XL 50 mg once daily.  Hypertension: Better controlled. No changes made today.  MR, TR, pulmonary hypertension: Likely driven by hypertnesion, as well as paroxysmal Afib. 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 Recommend aggressive hypertension management at this time.  Type II diabetes mellitus: Well controlled. Followed by endocrinologist Dr. Garnet Koyanagi.  F/u in 6 months after repeat echocardiogram.   Nigel Mormon, MD [facility] Pager: [phone] Office: [phone]

## 2020-04-22 ENCOUNTER — Other Ambulatory Visit: Payer: Self-pay | Admitting: Cardiology

## 2020-05-20 DIAGNOSIS — E0590 Thyrotoxicosis, unspecified without thyrotoxic crisis or storm: Secondary | ICD-10-CM | POA: Diagnosis not present

## 2020-05-20 DIAGNOSIS — E785 Hyperlipidemia, unspecified: Secondary | ICD-10-CM | POA: Diagnosis not present

## 2020-05-20 DIAGNOSIS — E032 Hypothyroidism due to medicaments and other exogenous substances: Secondary | ICD-10-CM | POA: Diagnosis not present

## 2020-05-20 DIAGNOSIS — I10 Essential (primary) hypertension: Secondary | ICD-10-CM | POA: Diagnosis not present

## 2020-05-20 DIAGNOSIS — E118 Type 2 diabetes mellitus with unspecified complications: Secondary | ICD-10-CM | POA: Diagnosis not present

## 2020-05-25 ENCOUNTER — Other Ambulatory Visit: Payer: Self-pay | Admitting: Cardiology

## 2020-05-26 DIAGNOSIS — D6859 Other primary thrombophilia: Secondary | ICD-10-CM | POA: Diagnosis not present

## 2020-05-26 DIAGNOSIS — Z23 Encounter for immunization: Secondary | ICD-10-CM | POA: Diagnosis not present

## 2020-05-26 DIAGNOSIS — E139 Other specified diabetes mellitus without complications: Secondary | ICD-10-CM | POA: Diagnosis not present

## 2020-05-26 DIAGNOSIS — E041 Nontoxic single thyroid nodule: Secondary | ICD-10-CM | POA: Diagnosis not present

## 2020-05-26 DIAGNOSIS — D692 Other nonthrombocytopenic purpura: Secondary | ICD-10-CM | POA: Diagnosis not present

## 2020-05-26 DIAGNOSIS — I119 Hypertensive heart disease without heart failure: Secondary | ICD-10-CM | POA: Diagnosis not present

## 2020-05-26 DIAGNOSIS — I4891 Unspecified atrial fibrillation: Secondary | ICD-10-CM | POA: Diagnosis not present

## 2020-05-26 DIAGNOSIS — E0590 Thyrotoxicosis, unspecified without thyrotoxic crisis or storm: Secondary | ICD-10-CM | POA: Diagnosis not present

## 2020-05-26 DIAGNOSIS — I10 Essential (primary) hypertension: Secondary | ICD-10-CM | POA: Diagnosis not present

## 2020-05-26 DIAGNOSIS — E785 Hyperlipidemia, unspecified: Secondary | ICD-10-CM | POA: Diagnosis not present

## 2020-05-26 DIAGNOSIS — Z0001 Encounter for general adult medical examination with abnormal findings: Secondary | ICD-10-CM | POA: Diagnosis not present

## 2020-05-26 DIAGNOSIS — M65322 Trigger finger, left index finger: Secondary | ICD-10-CM | POA: Diagnosis not present

## 2020-06-02 ENCOUNTER — Other Ambulatory Visit: Payer: Self-pay | Admitting: Internal Medicine

## 2020-06-02 DIAGNOSIS — F17210 Nicotine dependence, cigarettes, uncomplicated: Secondary | ICD-10-CM | POA: Diagnosis not present

## 2020-06-02 DIAGNOSIS — E0590 Thyrotoxicosis, unspecified without thyrotoxic crisis or storm: Secondary | ICD-10-CM

## 2020-06-02 DIAGNOSIS — E042 Nontoxic multinodular goiter: Secondary | ICD-10-CM | POA: Diagnosis not present

## 2020-06-02 DIAGNOSIS — E010 Iodine-deficiency related diffuse (endemic) goiter: Secondary | ICD-10-CM

## 2020-06-02 DIAGNOSIS — R809 Proteinuria, unspecified: Secondary | ICD-10-CM | POA: Diagnosis not present

## 2020-06-02 DIAGNOSIS — I4891 Unspecified atrial fibrillation: Secondary | ICD-10-CM | POA: Diagnosis not present

## 2020-06-02 DIAGNOSIS — Z6828 Body mass index (BMI) 28.0-28.9, adult: Secondary | ICD-10-CM | POA: Diagnosis not present

## 2020-06-02 DIAGNOSIS — E785 Hyperlipidemia, unspecified: Secondary | ICD-10-CM | POA: Diagnosis not present

## 2020-06-02 DIAGNOSIS — I10 Essential (primary) hypertension: Secondary | ICD-10-CM | POA: Diagnosis not present

## 2020-06-02 DIAGNOSIS — E139 Other specified diabetes mellitus without complications: Secondary | ICD-10-CM | POA: Diagnosis not present

## 2020-06-09 ENCOUNTER — Ambulatory Visit
Admission: RE | Admit: 2020-06-09 | Discharge: 2020-06-09 | Disposition: A | Discharge status: Home / Self Care | Payer: Medicare Other | Source: Ambulatory Visit | Attending: Internal Medicine | Admitting: Internal Medicine

## 2020-06-09 DIAGNOSIS — E010 Iodine-deficiency related diffuse (endemic) goiter: Secondary | ICD-10-CM

## 2020-06-09 DIAGNOSIS — E042 Nontoxic multinodular goiter: Secondary | ICD-10-CM

## 2020-06-09 DIAGNOSIS — E0590 Thyrotoxicosis, unspecified without thyrotoxic crisis or storm: Secondary | ICD-10-CM

## 2020-06-13 ENCOUNTER — Other Ambulatory Visit: Payer: Self-pay

## 2020-06-13 ENCOUNTER — Ambulatory Visit
Admission: RE | Admit: 2020-06-13 | Discharge: 2020-06-13 | Disposition: A | Discharge status: Home / Self Care | Payer: Medicare Other | Source: Ambulatory Visit | Attending: Internal Medicine | Admitting: Internal Medicine

## 2020-06-13 DIAGNOSIS — R928 Other abnormal and inconclusive findings on diagnostic imaging of breast: Secondary | ICD-10-CM | POA: Diagnosis not present

## 2020-06-13 DIAGNOSIS — N6489 Other specified disorders of breast: Secondary | ICD-10-CM | POA: Diagnosis not present

## 2020-06-28 ENCOUNTER — Other Ambulatory Visit: Payer: Self-pay | Admitting: *Deleted

## 2020-06-28 DIAGNOSIS — F17210 Nicotine dependence, cigarettes, uncomplicated: Secondary | ICD-10-CM

## 2020-06-28 DIAGNOSIS — Z87891 Personal history of nicotine dependence: Secondary | ICD-10-CM

## 2020-07-06 DIAGNOSIS — E118 Type 2 diabetes mellitus with unspecified complications: Secondary | ICD-10-CM | POA: Diagnosis not present

## 2020-07-06 DIAGNOSIS — Z1212 Encounter for screening for malignant neoplasm of rectum: Secondary | ICD-10-CM | POA: Diagnosis not present

## 2020-07-06 DIAGNOSIS — Z01419 Encounter for gynecological examination (general) (routine) without abnormal findings: Secondary | ICD-10-CM | POA: Diagnosis not present

## 2020-07-23 DIAGNOSIS — Z23 Encounter for immunization: Secondary | ICD-10-CM | POA: Diagnosis not present

## 2020-08-03 ENCOUNTER — Other Ambulatory Visit: Payer: Self-pay

## 2020-08-03 ENCOUNTER — Emergency Department (HOSPITAL_COMMUNITY)
Admission: EM | Admit: 2020-08-03 | Discharge: 2020-08-03 | Disposition: A | Discharge status: Home / Self Care | Payer: Medicare Other | Attending: Emergency Medicine | Admitting: Emergency Medicine

## 2020-08-03 ENCOUNTER — Encounter (HOSPITAL_COMMUNITY): Payer: Self-pay

## 2020-08-03 DIAGNOSIS — F17210 Nicotine dependence, cigarettes, uncomplicated: Secondary | ICD-10-CM | POA: Diagnosis not present

## 2020-08-03 DIAGNOSIS — E039 Hypothyroidism, unspecified: Secondary | ICD-10-CM | POA: Insufficient documentation

## 2020-08-03 DIAGNOSIS — Z794 Long term (current) use of insulin: Secondary | ICD-10-CM | POA: Diagnosis not present

## 2020-08-03 DIAGNOSIS — I10 Essential (primary) hypertension: Secondary | ICD-10-CM

## 2020-08-03 DIAGNOSIS — Z7901 Long term (current) use of anticoagulants: Secondary | ICD-10-CM | POA: Insufficient documentation

## 2020-08-03 DIAGNOSIS — I4891 Unspecified atrial fibrillation: Secondary | ICD-10-CM | POA: Insufficient documentation

## 2020-08-03 DIAGNOSIS — Z7984 Long term (current) use of oral hypoglycemic drugs: Secondary | ICD-10-CM | POA: Diagnosis not present

## 2020-08-03 DIAGNOSIS — E119 Type 2 diabetes mellitus without complications: Secondary | ICD-10-CM | POA: Diagnosis not present

## 2020-08-03 DIAGNOSIS — Z79899 Other long term (current) drug therapy: Secondary | ICD-10-CM | POA: Insufficient documentation

## 2020-08-03 DIAGNOSIS — R5383 Other fatigue: Secondary | ICD-10-CM | POA: Diagnosis not present

## 2020-08-03 DIAGNOSIS — R030 Elevated blood-pressure reading, without diagnosis of hypertension: Secondary | ICD-10-CM | POA: Diagnosis present

## 2020-08-03 NOTE — Discharge Instructions (Signed)
Please continue to take your blood pressure medicines as they are prescribed.  Please follow-up with your primary care doctor. If you have any new or concerning symptoms you may want to return to the emergency department for reevaluation.

## 2020-08-03 NOTE — ED Triage Notes (Signed)
Pt presents to ED, states this past week her BP has been really high up to 170/90 and she started feeling "lousy". Pt states she feels fine today. Pt states she had her booster covid shot last week.

## 2020-08-03 NOTE — ED Provider Notes (Signed)
[facility] EMERGENCY DEPARTMENT Provider Note   CSN: [id number] Arrival date & time: 08/03/20  1308     History Chief Complaint  Patient presents with  . Hypertension    Carol Valenzuela is a 69 y.o. female.  HPI Patient is a 69 year old female with a history of diabetes, hyperlipidemia, hypertension and hypothyroidism.  She states that she got her posterior Covid shot approximately 1 week ago and felt somewhat drowsy and under the weather for 24 to 36 hours.  She states that although the symptoms completely resolved but she has noticed that her blood pressures have been somewhat elevated as high as 170/90 and that her blood sugar was as high as 200 she states that her blood pressure and blood sugar usually very well controlled.  She has not changed her blood pressure medications, regimen, missed any doses or had any other abnormal symptoms recently.  She specifically denies any chest pain shortness of breath, headaches nosebleeds or abdominal pain.  No other associate symptoms.  She states that she checks her blood pressure every other day but is been checking it multiple times a day recently because she is concerned about this.  She states that she was seen by her primary care doctor who did not change any medicines and she plans to return for recheck within the week.    Past Medical History:  Diagnosis Date  . Diabetes ([place])   . Hyperlipidemia   . Hypertension   . Hyperthyroidism     Patient Active Problem List   Diagnosis Date Noted  . Pulmonary hypertension ([place]) 08/20/2019  . Paroxysmal atrial fibrillation ([place]) 06/29/2019  . Hyperlipidemia 04/24/2018  . Musculoskeletal pain 04/24/2018  . Obesity, unspecified 04/24/2018  . Diabetes mellitus ([place]) 09/28/2016  . Essential hypertension 09/28/2016  . Anxiety associated with depression 02/16/2014  . Diabetes mellitus type 2, uncomplicated ([place]) 65/78/4696  . Wart 06/08/2011    Past Surgical History:  Procedure Laterality  Date  . ankle sx Left    broken ankle repair     OB History   No obstetric history on file.     Family History  Problem Relation Age of Onset  . Brain cancer Father   . Lung cancer Father   . Liver cancer Father   . Alcoholism Father   . Breast cancer Neg Hx     Social History   Tobacco Use  . Smoking status: Current Some Day Smoker    Packs/day: 0.25    Years: 44.00    Pack years: 11.00    Types: Cigarettes  . Smokeless tobacco: Never Used  . Tobacco comment: Down to 9 cigs a day  Vaping Use  . Vaping Use: Never used  Substance Use Topics  . Alcohol use: Not Currently  . Drug use: Not Currently    Home Medications Prior to Admission medications   Medication Sig Start Date End Date Taking? Authorizing Provider  amLODipine (NORVASC) 10 MG tablet Take 1 tablet by mouth daily. 11/11/19   [provider]  apixaban (ELIQUIS) 5 MG TABS tablet Take 5 mg by mouth 2 (two) times daily.    [provider]  atorvastatin (LIPITOR) 40 MG tablet TAKE 1 TABLET EVERY DAY 05/25/20   Patwardhan, Manish J, MD  calcium-vitamin D (CALCIUM 500+D HIGH POTENCY) 500-400 MG-UNIT tablet Take 1 tablet by mouth daily.    [provider]  cholecalciferol (VITAMIN D) 400 units TABS tablet Take 1,000 Units by mouth daily.     [provider]  insulin aspart (NOVOLOG FLEXPEN) 100 UNIT/ML FlexPen Inject into the skin 3 (three) times daily with meals.    [provider]  Insulin Degludec (TRESIBA) 100 UNIT/ML SOLN Inject 12 Units into the skin daily.    [provider]  lisinopril (PRINIVIL,ZESTRIL) 40 MG tablet Take 20 mg by mouth daily.     [provider]  metFORMIN (GLUCOPHAGE) 500 MG tablet Take 500 mg by mouth 2 (two) times daily.    [provider]  methimazole (TAPAZOLE) 5 MG tablet Take 5 mg by mouth daily.    [provider]  metoprolol succinate (TOPROL-XL) 50 MG 24 hr tablet Take 50 mg by mouth daily. Take with or  immediately following a meal.    [provider]  multivitamin-iron-minerals-folic acid (CENTRUM) chewable tablet Chew 1 tablet by mouth daily.    [provider]  Omega-3 Fatty Acids (FISH OIL) 1000 MG CAPS Take 1 capsule by mouth daily.    [provider]    Allergies    Patient has no known allergies.  Review of Systems   Review of Systems  Constitutional: Positive for fatigue (Resolved). Negative for fever.  HENT: Negative for congestion.   Respiratory: Negative for shortness of breath.   Cardiovascular: Negative for chest pain.  Gastrointestinal: Negative for abdominal distention.  Neurological: Negative for dizziness and headaches.    Physical Exam Updated Vital Signs BP (!) 160/73   Pulse 65   Temp 98 F (36.7 C) (Oral)   Resp 14   Ht 5\' 6"  (1.676 m)   Wt 74.8 kg   LMP  (LMP Unknown)   SpO2 99%   BMI 26.63 kg/m   Physical Exam Vitals and nursing note reviewed.  Constitutional:      General: She is not in acute distress.    Comments: Pleasant well-appearing 69 year old.  In no acute distress.  Sitting comfortably in bed.  Able answer questions appropriately follow commands. No increased work of breathing. Speaking in full sentences.  HENT:     Head: Normocephalic and atraumatic.     Nose: Nose normal.     Mouth/Throat:     Mouth: Mucous membranes are moist.  Eyes:     General: No scleral icterus. Cardiovascular:     Rate and Rhythm: Normal rate and regular rhythm.     Pulses: Normal pulses.     Heart sounds: Normal heart sounds.  Pulmonary:     Effort: Pulmonary effort is normal. No respiratory distress.     Breath sounds: Normal breath sounds. No wheezing.  Abdominal:     Palpations: Abdomen is soft.     Tenderness: There is no abdominal tenderness. There is no guarding or rebound.  Musculoskeletal:     Cervical back: Normal range of motion.     Right lower leg: No edema.     Left lower leg: No edema.     Comments: Lower  extremities without swelling, edema, tenderness  Skin:    General: Skin is warm and dry.     Capillary Refill: Capillary refill takes less than 2 seconds.  Neurological:     Mental Status: She is alert. Mental status is at baseline.  Psychiatric:        Mood and Affect: Mood normal.        Behavior: Behavior normal.     ED Results / Procedures / Treatments   Labs (all labs ordered are listed, but only abnormal results are displayed) Labs Reviewed - No data  to display  EKG None  Radiology No results found.  Procedures Procedures (including critical care time)  Medications Ordered in ED Medications - No data to display  ED Course  I have reviewed the triage vital signs and the nursing notes.  Pertinent labs & imaging results that were available during my care of the patient were reviewed by me and considered in my medical decision making (see chart for details).    MDM Rules/Calculators/A&P                          Patient is a 69 year old female with past medical history detailed above presented today for elevated blood pressures.  She has no other associate symptoms today.  Has had some fatigue after her Covid shot 1 week ago.  She states her blood pressure is seem to be as high as 170/90  Her blood pressure here today has been generally in the 449-675 range systolic and apart from one outlier BP has had diastolic between 91-63.   Discussed my attending physician Dr. Roderic Palau.  Plan is to discharge patient with follow-up with her primary care doctor.  She is agreeable to this plan.  Her EKG shows right bundle branch block but no ST-T wave abnormalities tachycardia or other acute abnormalities.  I reviewed EKG from 02/18/2020 she had right bundle branch block at that time as well.  Patient is on Eliquis for PAF.  She follows with Dr. Virgina Jock.  I recommend follow-up with cardiology for continued care of her blood pressure and her medications related to blood pressure and A.  Fib.  She will return to the ED for any new or concerning symptoms.  Final Clinical Impression(s) / ED Diagnoses Final diagnoses:  Hypertension, unspecified type    Rx / DC Orders ED Discharge Orders    None       Tedd Sias, [place] 08/03/20 1419    Milton Ferguson, MD 08/05/20 [phone]

## 2020-08-04 ENCOUNTER — Ambulatory Visit
Admission: RE | Admit: 2020-08-04 | Discharge: 2020-08-04 | Disposition: A | Discharge status: Home / Self Care | Payer: Medicare Other | Source: Ambulatory Visit | Attending: Acute Care | Admitting: Acute Care

## 2020-08-04 ENCOUNTER — Ambulatory Visit: Payer: Medicare Other

## 2020-08-04 DIAGNOSIS — F17210 Nicotine dependence, cigarettes, uncomplicated: Secondary | ICD-10-CM | POA: Diagnosis not present

## 2020-08-04 DIAGNOSIS — I700 Atherosclerosis of aorta: Secondary | ICD-10-CM | POA: Diagnosis not present

## 2020-08-04 DIAGNOSIS — I272 Pulmonary hypertension, unspecified: Secondary | ICD-10-CM

## 2020-08-04 DIAGNOSIS — J432 Centrilobular emphysema: Secondary | ICD-10-CM | POA: Diagnosis not present

## 2020-08-04 DIAGNOSIS — I2510 Atherosclerotic heart disease of native coronary artery without angina pectoris: Secondary | ICD-10-CM | POA: Diagnosis not present

## 2020-08-04 DIAGNOSIS — Z87891 Personal history of nicotine dependence: Secondary | ICD-10-CM

## 2020-08-05 NOTE — Progress Notes (Signed)
Please call patient and let them  know their  low dose Ct was read as a Lung RADS 2: nodules that are benign in appearance and behavior with a very low likelihood of becoming a clinically active cancer due to size or lack of growth. Recommendation per radiology is for a repeat LDCT in 12 months. .Please let them  know we will order and schedule their  annual screening scan for 07/2021. Please let them  know there was notation of CAD on their  scan.  Please remind the patient  that this is a non-gated exam therefore degree or severity of disease  cannot be determined. Please have them  follow up with their PCP regarding potential risk factor modification, dietary therapy or pharmacologic therapy if clinically indicated. Pt.  is  currently on statin therapy. Please place order for annual  screening scan for  07/2021 and fax results to PCP. Thanks so much. 

## 2020-08-15 ENCOUNTER — Other Ambulatory Visit: Payer: Self-pay | Admitting: *Deleted

## 2020-08-15 DIAGNOSIS — F17210 Nicotine dependence, cigarettes, uncomplicated: Secondary | ICD-10-CM

## 2020-08-15 DIAGNOSIS — Z87891 Personal history of nicotine dependence: Secondary | ICD-10-CM

## 2020-08-18 ENCOUNTER — Ambulatory Visit: Payer: Medicare Other | Admitting: Cardiology

## 2020-08-18 ENCOUNTER — Other Ambulatory Visit: Payer: Self-pay

## 2020-08-18 ENCOUNTER — Encounter: Payer: Self-pay | Admitting: Cardiology

## 2020-08-18 VITALS — BP 162/76 | HR 65 | Resp 16 | Ht 66.0 in | Wt 167.0 lb

## 2020-08-18 DIAGNOSIS — I480 Paroxysmal atrial fibrillation: Secondary | ICD-10-CM

## 2020-08-18 DIAGNOSIS — I10 Essential (primary) hypertension: Secondary | ICD-10-CM | POA: Diagnosis not present

## 2020-08-18 DIAGNOSIS — I272 Pulmonary hypertension, unspecified: Secondary | ICD-10-CM | POA: Diagnosis not present

## 2020-08-18 NOTE — Progress Notes (Signed)
Follow up visit  Subjective:   Carol Valenzuela, female    DOB: [birth year], 69 y.o.   MRN: [id number]  HPI   Chief Complaint  Patient presents with  . Pulmonary hypertension  . 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 Denies chest pain, shortness of breath. She sometimes has "good days and bad days". Blood pressure is elevated today, but she states she rushed for her appt today as she just drove back from [place] yesterday. Blood pressure has been well controlled at home.  She has had diagnosis of hyperthyroidism and has been on methimazole for some time. She recently underwent US thyroid that showed multinodular goiter, with at least one looking suspicious.   She recently underwent lung cancer screening CT chest that showed LAS calcificaition.  Recent echocardiogram reviewed with the patient, details below.   Current Outpatient Medications on File Prior to Visit  Medication Sig Dispense Refill  . amLODipine (NORVASC) 10 MG tablet Take 1 tablet by mouth daily.    [name] apixaban (ELIQUIS) 5 MG TABS tablet Take 5 mg by mouth 2 (two) times daily.    [name] atorvastatin (LIPITOR) 40 MG tablet TAKE 1 TABLET EVERY DAY 90 tablet 2  . buPROPion (WELLBUTRIN XL) 150 MG 24 hr tablet Take 150 mg by mouth at bedtime.    . calcium-vitamin D (CALCIUM 500+D HIGH POTENCY) 500-400 MG-UNIT tablet Take 1 tablet by mouth daily.    . cholecalciferol (VITAMIN D) 400 units TABS tablet Take 1,000 Units by mouth daily.     . insulin aspart (NOVOLOG FLEXPEN) 100 UNIT/ML FlexPen Inject into the skin 3 (three) times daily with meals.    . Insulin Degludec (TRESIBA) 100 UNIT/ML SOLN Inject 12 Units into the skin daily.    [name] lisinopril (PRINIVIL,ZESTRIL) 40 MG tablet Take 20 mg by mouth daily.     . metFORMIN (GLUCOPHAGE) 500 MG tablet Take 500 mg by mouth 2  (two) times daily.    . methimazole (TAPAZOLE) 5 MG tablet Take 5 mg by mouth daily.    . metoprolol succinate (TOPROL-XL) 50 MG 24 hr tablet Take 50 mg by mouth daily. Take with or immediately following a meal.    . multivitamin-iron-minerals-folic acid (CENTRUM) chewable tablet Chew 1 tablet by mouth daily.    . Omega-3 Fatty Acids (FISH OIL) 1000 MG CAPS Take 1 capsule by mouth daily.     No current facility-administered medications on file prior to visit.    Cardiovascular & other pertient studies:  EKG 08/18/2020: Sinus rhythm 67 bpm  Frequent PACs Right bundle branch block   CT chest 08/05/2019: 1. Lung-RADS 2S, benign appearance or behavior. Continue annual screening with low-dose chest CT without contrast in 12 months. 2. The "S" modifier above refers to potentially clinically significant non lung cancer related findings. Specifically, there is aortic atherosclerosis, in addition to left anterior descending coronary artery disease. Please note that although the presence of coronary artery calcium documents the presence of coronary artery disease, the severity of this disease and any potential stenosis cannot be assessed on this non-gated CT examination. Assessment for potential risk factor modification, dietary therapy or pharmacologic therapy may be warranted, if clinically indicated. 3. Mild diffuse bronchial wall thickening with mild centrilobular and paraseptal emphysema; imaging findings suggestive of underlying COPD.  Aortic Atherosclerosis (ICD10-I70.0) and Emphysema (ICD10-J43.9).  Echocardiogram 08/04/2020:  Normal LV systolic function with visual EF 60-65%. Left ventricle cavity  is normal in size. Normal global wall motion. Doppler evidence of grade II  (pseudonormal) diastolic dysfunction, elevated LAP.  Left atrial cavity is severely dilated.  Right atrial cavity is mildly dilated.  Trace aortic regurgitation.  Mild (Grade I) mitral regurgitation.   Moderate tricuspid regurgitation. Mild pulmonary hypertension. RVSP  measures 44 mmHg.  IVC is dilated with a respiratory response of >50%.  Compared to prior study dated 12/31/2019 no significant change.   US thyroid 05/2020: 1. Thyromegaly with bilateral nodules. Recommend FNA biopsy of moderately suspicious 1.9 cm inferior left nodule. 2. Recommend annual/biennial ultrasound follow-up of additional nodules as above, until stability x5 years confirmed.  Recent labs: 05/20/2020: eGFR 57 Chol 191, TG 130, HDL 92, LDL 71  12/24/2019: BUN/Cr 21/1.6. eGFR 65. HbA1C 9.8%  04/13/2019: Glucose 190, BUN/Cr 14/0.9. EGFR 68. Na/K 139/4.3. Albumin 3.3, total protein 6.3. Rest of the CMP normal H/H 13/39. MCV 90. Platelets 213 HbA1C 9.7% Chol 195, TG 82, HDL 90, LDL 89 TSH 3.7 normal   Review of Systems  Constitutional: Positive for malaise/fatigue (Feels "old and slow").  Cardiovascular: Negative for chest pain, dyspnea on exertion, leg swelling, palpitations and syncope.         Vitals:   08/18/20 1146  BP: (!) 162/76  Pulse: 65  Resp: 16  SpO2: 98%     Body mass index is 26.95 kg/m. Filed Weights   08/18/20 1146  Weight: 167 lb (75.8 kg)     Objective:   Physical Exam Vitals and nursing note reviewed.  Constitutional:      General: She is not in acute distress. Neck:     Vascular: No JVD.  Cardiovascular:     Rate and Rhythm: Normal rate and regular rhythm.     Pulses: Intact distal pulses.     Heart sounds: Normal heart sounds. No murmur heard.   Pulmonary:     Effort: Pulmonary effort is normal.     Breath sounds: Normal breath sounds. No wheezing or rales.         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 No  angina symptoms. Not on aspirin due to ongoing use of eliquis. Continue metoprolol. Lipids well controlled.  Paroxysmal atrial fibrillation: In sinus rhythm today. Continue anticoagulation with Eliquis 5 mg twice daily for CHADSVASc Score of 5. Continue metoprolol XL 50 mg once daily.  Hypertension: BP elevated today. Continue monitoring at home. IF SBP remains >140 mmHg, could increase metoprolol succinate to 100 mg.  Hyperthyroidism: Continue f/u w/Endorcinology. Will check if she needs biopsy.  Better controlled. No changes made today.  MR, TR, pulmonary hypertension: Likely driven by hypertnesion, as well as paroxysmal Afib. 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 Recommend aggressive hypertension management at this time.   Type II diabetes mellitus: Well controlled. Followed by endocrinologist Dr. Garnet Koyanagi.   F/u in 6 months   Time spent: 40 min   [place], MD [facility] Pager: [phone] Office: [phone]

## 2020-08-19 ENCOUNTER — Ambulatory Visit: Payer: 59 | Admitting: Cardiology

## 2020-10-19 DIAGNOSIS — E785 Hyperlipidemia, unspecified: Secondary | ICD-10-CM | POA: Diagnosis not present

## 2020-10-19 DIAGNOSIS — E0590 Thyrotoxicosis, unspecified without thyrotoxic crisis or storm: Secondary | ICD-10-CM | POA: Diagnosis not present

## 2020-10-19 DIAGNOSIS — F17210 Nicotine dependence, cigarettes, uncomplicated: Secondary | ICD-10-CM | POA: Diagnosis not present

## 2020-10-19 DIAGNOSIS — R809 Proteinuria, unspecified: Secondary | ICD-10-CM | POA: Diagnosis not present

## 2020-10-19 DIAGNOSIS — I10 Essential (primary) hypertension: Secondary | ICD-10-CM | POA: Diagnosis not present

## 2020-10-19 DIAGNOSIS — E010 Iodine-deficiency related diffuse (endemic) goiter: Secondary | ICD-10-CM | POA: Diagnosis not present

## 2020-10-19 DIAGNOSIS — Z6828 Body mass index (BMI) 28.0-28.9, adult: Secondary | ICD-10-CM | POA: Diagnosis not present

## 2020-10-19 DIAGNOSIS — I4891 Unspecified atrial fibrillation: Secondary | ICD-10-CM | POA: Diagnosis not present

## 2020-10-19 DIAGNOSIS — E139 Other specified diabetes mellitus without complications: Secondary | ICD-10-CM | POA: Diagnosis not present

## 2020-10-19 DIAGNOSIS — E042 Nontoxic multinodular goiter: Secondary | ICD-10-CM | POA: Diagnosis not present

## 2020-10-20 ENCOUNTER — Other Ambulatory Visit: Payer: Self-pay

## 2020-10-20 ENCOUNTER — Encounter: Payer: Self-pay | Admitting: Podiatry

## 2020-10-20 ENCOUNTER — Ambulatory Visit (INDEPENDENT_AMBULATORY_CARE_PROVIDER_SITE_OTHER): Payer: Medicare Other | Admitting: Podiatry

## 2020-10-20 DIAGNOSIS — M79675 Pain in left toe(s): Secondary | ICD-10-CM

## 2020-10-20 DIAGNOSIS — B351 Tinea unguium: Secondary | ICD-10-CM

## 2020-10-20 DIAGNOSIS — M79674 Pain in right toe(s): Secondary | ICD-10-CM

## 2020-10-21 NOTE — Progress Notes (Signed)
Subjective:   Patient ID: Carol Valenzuela, female   DOB: 70 y.o.   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

## 2020-11-14 DIAGNOSIS — E785 Hyperlipidemia, unspecified: Secondary | ICD-10-CM | POA: Diagnosis not present

## 2020-11-14 DIAGNOSIS — M810 Age-related osteoporosis without current pathological fracture: Secondary | ICD-10-CM | POA: Diagnosis not present

## 2020-11-14 DIAGNOSIS — I119 Hypertensive heart disease without heart failure: Secondary | ICD-10-CM | POA: Diagnosis not present

## 2020-11-14 DIAGNOSIS — I4891 Unspecified atrial fibrillation: Secondary | ICD-10-CM | POA: Diagnosis not present

## 2020-12-15 DIAGNOSIS — I4891 Unspecified atrial fibrillation: Secondary | ICD-10-CM | POA: Diagnosis not present

## 2020-12-15 DIAGNOSIS — E785 Hyperlipidemia, unspecified: Secondary | ICD-10-CM | POA: Diagnosis not present

## 2020-12-15 DIAGNOSIS — I119 Hypertensive heart disease without heart failure: Secondary | ICD-10-CM | POA: Diagnosis not present

## 2020-12-15 DIAGNOSIS — M810 Age-related osteoporosis without current pathological fracture: Secondary | ICD-10-CM | POA: Diagnosis not present

## 2021-01-03 DIAGNOSIS — Z23 Encounter for immunization: Secondary | ICD-10-CM | POA: Diagnosis not present

## 2021-01-14 DIAGNOSIS — E785 Hyperlipidemia, unspecified: Secondary | ICD-10-CM | POA: Diagnosis not present

## 2021-01-14 DIAGNOSIS — I4891 Unspecified atrial fibrillation: Secondary | ICD-10-CM | POA: Diagnosis not present

## 2021-01-14 DIAGNOSIS — I119 Hypertensive heart disease without heart failure: Secondary | ICD-10-CM | POA: Diagnosis not present

## 2021-01-14 DIAGNOSIS — M810 Age-related osteoporosis without current pathological fracture: Secondary | ICD-10-CM | POA: Diagnosis not present

## 2021-01-18 DIAGNOSIS — E1142 Type 2 diabetes mellitus with diabetic polyneuropathy: Secondary | ICD-10-CM | POA: Diagnosis not present

## 2021-01-18 DIAGNOSIS — L84 Corns and callosities: Secondary | ICD-10-CM | POA: Diagnosis not present

## 2021-01-18 DIAGNOSIS — B351 Tinea unguium: Secondary | ICD-10-CM | POA: Diagnosis not present

## 2021-01-18 DIAGNOSIS — M79676 Pain in unspecified toe(s): Secondary | ICD-10-CM | POA: Diagnosis not present

## 2021-02-02 DIAGNOSIS — E032 Hypothyroidism due to medicaments and other exogenous substances: Secondary | ICD-10-CM | POA: Diagnosis not present

## 2021-02-02 DIAGNOSIS — R809 Proteinuria, unspecified: Secondary | ICD-10-CM | POA: Diagnosis not present

## 2021-02-02 DIAGNOSIS — E118 Type 2 diabetes mellitus with unspecified complications: Secondary | ICD-10-CM | POA: Diagnosis not present

## 2021-02-02 DIAGNOSIS — E785 Hyperlipidemia, unspecified: Secondary | ICD-10-CM | POA: Diagnosis not present

## 2021-02-09 DIAGNOSIS — E139 Other specified diabetes mellitus without complications: Secondary | ICD-10-CM | POA: Diagnosis not present

## 2021-02-09 DIAGNOSIS — Z6828 Body mass index (BMI) 28.0-28.9, adult: Secondary | ICD-10-CM | POA: Diagnosis not present

## 2021-02-09 DIAGNOSIS — I10 Essential (primary) hypertension: Secondary | ICD-10-CM | POA: Diagnosis not present

## 2021-02-09 DIAGNOSIS — E010 Iodine-deficiency related diffuse (endemic) goiter: Secondary | ICD-10-CM | POA: Diagnosis not present

## 2021-02-09 DIAGNOSIS — E11649 Type 2 diabetes mellitus with hypoglycemia without coma: Secondary | ICD-10-CM | POA: Diagnosis not present

## 2021-02-09 DIAGNOSIS — E0590 Thyrotoxicosis, unspecified without thyrotoxic crisis or storm: Secondary | ICD-10-CM | POA: Diagnosis not present

## 2021-02-09 DIAGNOSIS — R809 Proteinuria, unspecified: Secondary | ICD-10-CM | POA: Diagnosis not present

## 2021-02-09 DIAGNOSIS — I4891 Unspecified atrial fibrillation: Secondary | ICD-10-CM | POA: Diagnosis not present

## 2021-02-09 DIAGNOSIS — F17210 Nicotine dependence, cigarettes, uncomplicated: Secondary | ICD-10-CM | POA: Diagnosis not present

## 2021-02-09 DIAGNOSIS — E785 Hyperlipidemia, unspecified: Secondary | ICD-10-CM | POA: Diagnosis not present

## 2021-02-09 DIAGNOSIS — E042 Nontoxic multinodular goiter: Secondary | ICD-10-CM | POA: Diagnosis not present

## 2021-02-14 DIAGNOSIS — E785 Hyperlipidemia, unspecified: Secondary | ICD-10-CM | POA: Diagnosis not present

## 2021-02-14 DIAGNOSIS — I119 Hypertensive heart disease without heart failure: Secondary | ICD-10-CM | POA: Diagnosis not present

## 2021-02-14 DIAGNOSIS — M810 Age-related osteoporosis without current pathological fracture: Secondary | ICD-10-CM | POA: Diagnosis not present

## 2021-02-16 ENCOUNTER — Ambulatory Visit: Payer: Medicare Other | Admitting: Cardiology

## 2021-02-16 ENCOUNTER — Encounter: Payer: Self-pay | Admitting: Cardiology

## 2021-02-16 ENCOUNTER — Other Ambulatory Visit: Payer: Self-pay

## 2021-02-16 VITALS — BP 151/70 | HR 58 | Temp 98.0°F | Resp 16 | Ht 66.0 in | Wt 174.0 lb

## 2021-02-16 DIAGNOSIS — I10 Essential (primary) hypertension: Secondary | ICD-10-CM | POA: Diagnosis not present

## 2021-02-16 DIAGNOSIS — I272 Pulmonary hypertension, unspecified: Secondary | ICD-10-CM | POA: Diagnosis not present

## 2021-02-16 DIAGNOSIS — I480 Paroxysmal atrial fibrillation: Secondary | ICD-10-CM | POA: Diagnosis not present

## 2021-02-16 NOTE — Progress Notes (Signed)
Follow up visit  Subjective:   Carol Valenzuela, female    DOB: Jan 07, 1951, 70 y.o.   MRN: [id number]  HPI   Chief Complaint  Patient presents with  . Pulmonary hypertension   . Atrial Fibrillation  . 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 Blood pressure elevated today, unsure what it usually is at home.   Current Outpatient Medications on File Prior to Visit  Medication Sig Dispense Refill  . amLODipine (NORVASC) 10 MG tablet Take 1 tablet by mouth daily.    [name] apixaban (ELIQUIS) 5 MG TABS tablet Take 5 mg by mouth 2 (two) times daily.    [name] atorvastatin (LIPITOR) 40 MG tablet TAKE 1 TABLET EVERY DAY 90 tablet 2  . buPROPion (WELLBUTRIN XL) 150 MG 24 hr tablet Take 150 mg by mouth at bedtime.    . calcium-vitamin D (CALCIUM 500+D HIGH POTENCY) 500-400 MG-UNIT tablet Take 1 tablet by mouth daily.    . cholecalciferol (VITAMIN D) 400 units TABS tablet Take 1,000 Units by mouth daily.     . insulin aspart (NOVOLOG FLEXPEN) 100 UNIT/ML FlexPen Inject into the skin 3 (three) times daily with meals.    . Insulin Degludec (TRESIBA) 100 UNIT/ML SOLN Inject 12 Units into the skin daily.    [name] lisinopril (PRINIVIL,ZESTRIL) 40 MG tablet Take 20 mg by mouth daily.     . metFORMIN (GLUCOPHAGE) 500 MG tablet Take 500 mg by mouth 2 (two) times daily.    . methimazole (TAPAZOLE) 5 MG tablet Take 5 mg by mouth daily.    . metoprolol succinate (TOPROL-XL) 50 MG 24 hr tablet Take 50 mg by mouth daily. Take with or immediately following a meal.    . multivitamin-iron-minerals-folic acid (CENTRUM) chewable tablet Chew 1 tablet by mouth daily.    . Omega-3 Fatty Acids (FISH OIL) 1000 MG CAPS Take 1 capsule by mouth daily.     No current  facility-administered medications on file prior to visit.    Cardiovascular & other pertient studies:  EKG 02/16/2021: Sinus rhythm 57 bpm Right bundle branch block  CT chest 08/05/2019: 1. Lung-RADS 2S, benign appearance or behavior. Continue annual screening with low-dose chest CT without contrast in 12 months. 2. The "S" modifier above refers to potentially clinically significant non lung cancer related findings. Specifically, there is aortic atherosclerosis, in addition to left anterior descending coronary artery disease. Please note that although the presence of coronary artery calcium documents the presence of coronary artery disease, the severity of this disease and any potential stenosis cannot be assessed on this non-gated CT examination. Assessment for potential risk factor modification, dietary therapy or pharmacologic therapy may be warranted, if clinically indicated. 3. Mild diffuse bronchial wall thickening with mild centrilobular and paraseptal emphysema; imaging findings suggestive of underlying COPD.  Aortic Atherosclerosis (ICD10-I70.0) and Emphysema (ICD10-J43.9).  Echocardiogram 08/04/2020:  Normal LV systolic function with visual EF 60-65%. Left ventricle cavity  is normal in size. Normal global wall motion. Doppler evidence of grade II  (pseudonormal) diastolic dysfunction, elevated LAP.  Left atrial cavity is severely dilated.  Right atrial cavity is mildly dilated.  Trace aortic regurgitation.  Mild (Grade I) mitral regurgitation.  Moderate tricuspid regurgitation. Mild pulmonary hypertension. RVSP  measures 44 mmHg.  IVC is dilated  with a respiratory response of >50%.  Compared to prior study dated 12/31/2019 no significant change.   US thyroid 05/2020: 1. Thyromegaly with bilateral nodules. Recommend FNA biopsy of moderately suspicious 1.9 cm inferior left nodule. 2. Recommend annual/biennial ultrasound follow-up of additional nodules as above, until  stability x5 years confirmed.  Recent labs: 05/20/2020: eGFR 57 Chol 191, TG 130, HDL 92, LDL 71  12/24/2019: BUN/Cr 21/1.6. eGFR 65. HbA1C 9.8%  04/13/2019: Glucose 190, BUN/Cr 14/0.9. EGFR 68. Na/K 139/4.3. Albumin 3.3, total protein 6.3. Rest of the CMP normal H/H 13/39. MCV 90. Platelets 213 HbA1C 9.7% Chol 195, TG 82, HDL 90, LDL 89 TSH 3.7 normal   Review of Systems  Constitutional: Positive for malaise/fatigue (Feels "old and slow").  Cardiovascular: Negative for chest pain, dyspnea on exertion, leg swelling, palpitations and syncope.         Vitals:   02/16/21 1038 02/16/21 1049  BP: (!) 187/65 (!) 151/70  Pulse: (!) 59 (!) 58  Resp: 16   Temp: 98 F (36.7 C)   SpO2: 98%      Body mass index is 28.08 kg/m. Filed Weights   02/16/21 1038  Weight: 174 lb (78.9 kg)     Objective:   Physical Exam Vitals and nursing note reviewed.  Constitutional:      General: She is not in acute distress. Neck:     Vascular: No JVD.  Cardiovascular:     Rate and Rhythm: Normal rate and regular rhythm.     Pulses: Intact distal pulses.     Heart sounds: Normal heart sounds. No murmur heard.   Pulmonary:     Effort: Pulmonary effort is normal.     Breath sounds: Normal breath sounds. No wheezing or rales.         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 No angina symptoms. Not on aspirin due to ongoing use of eliquis. Continue metoprolol. Lipids well controlled.  Paroxysmal atrial fibrillation: In sinus rhythm today. Continue anticoagulation with Eliquis 5 mg twice daily for CHADSVASc Score of 5. Continue metoprolol XL 50 mg once daily.  Hypertension: BP elevated today.  Arranged for remote patient monitoring  through pur pharmacist Manuela Schwartz. Will recommend changes based on home monitoring numbers  Hyperthyroidism: Continue f/u w/Endorcinology.  Will get records of recent visit.  MR, TR, pulmonary hypertension: Likely driven by hypertnesion, as well as paroxysmal Afib. 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 Asymptomatic at this time. Repeat echocardiogram in 08/2020.  Type II diabetes mellitus: Well controlled. Followed by endocrinologist Dr. Garnet Koyanagi.  F/u in 6 months    Avien Taha Esther Hardy, MD [facility] [place] Pager: [phone] Office: [phone]

## 2021-03-16 DIAGNOSIS — M810 Age-related osteoporosis without current pathological fracture: Secondary | ICD-10-CM | POA: Diagnosis not present

## 2021-03-16 DIAGNOSIS — I4891 Unspecified atrial fibrillation: Secondary | ICD-10-CM | POA: Diagnosis not present

## 2021-03-16 DIAGNOSIS — I119 Hypertensive heart disease without heart failure: Secondary | ICD-10-CM | POA: Diagnosis not present

## 2021-03-16 DIAGNOSIS — E785 Hyperlipidemia, unspecified: Secondary | ICD-10-CM | POA: Diagnosis not present

## 2021-03-16 DIAGNOSIS — I10 Essential (primary) hypertension: Secondary | ICD-10-CM | POA: Diagnosis not present

## 2021-04-16 DIAGNOSIS — I4891 Unspecified atrial fibrillation: Secondary | ICD-10-CM | POA: Diagnosis not present

## 2021-04-16 DIAGNOSIS — M810 Age-related osteoporosis without current pathological fracture: Secondary | ICD-10-CM | POA: Diagnosis not present

## 2021-04-16 DIAGNOSIS — E785 Hyperlipidemia, unspecified: Secondary | ICD-10-CM | POA: Diagnosis not present

## 2021-04-16 DIAGNOSIS — I119 Hypertensive heart disease without heart failure: Secondary | ICD-10-CM | POA: Diagnosis not present

## 2021-04-19 DIAGNOSIS — E1142 Type 2 diabetes mellitus with diabetic polyneuropathy: Secondary | ICD-10-CM | POA: Diagnosis not present

## 2021-04-19 DIAGNOSIS — L84 Corns and callosities: Secondary | ICD-10-CM | POA: Diagnosis not present

## 2021-04-19 DIAGNOSIS — B351 Tinea unguium: Secondary | ICD-10-CM | POA: Diagnosis not present

## 2021-04-19 DIAGNOSIS — M79676 Pain in unspecified toe(s): Secondary | ICD-10-CM | POA: Diagnosis not present

## 2021-04-21 DIAGNOSIS — I10 Essential (primary) hypertension: Secondary | ICD-10-CM | POA: Diagnosis not present

## 2021-05-08 ENCOUNTER — Other Ambulatory Visit: Payer: Self-pay | Admitting: Cardiology

## 2021-05-14 DIAGNOSIS — Z760 Encounter for issue of repeat prescription: Secondary | ICD-10-CM | POA: Diagnosis not present

## 2021-05-18 DIAGNOSIS — I10 Essential (primary) hypertension: Secondary | ICD-10-CM | POA: Diagnosis not present

## 2021-05-18 DIAGNOSIS — E0590 Thyrotoxicosis, unspecified without thyrotoxic crisis or storm: Secondary | ICD-10-CM | POA: Diagnosis not present

## 2021-05-18 DIAGNOSIS — E010 Iodine-deficiency related diffuse (endemic) goiter: Secondary | ICD-10-CM | POA: Diagnosis not present

## 2021-05-18 DIAGNOSIS — F17210 Nicotine dependence, cigarettes, uncomplicated: Secondary | ICD-10-CM | POA: Diagnosis not present

## 2021-05-18 DIAGNOSIS — E118 Type 2 diabetes mellitus with unspecified complications: Secondary | ICD-10-CM | POA: Diagnosis not present

## 2021-05-18 DIAGNOSIS — Z6828 Body mass index (BMI) 28.0-28.9, adult: Secondary | ICD-10-CM | POA: Diagnosis not present

## 2021-05-18 DIAGNOSIS — I4891 Unspecified atrial fibrillation: Secondary | ICD-10-CM | POA: Diagnosis not present

## 2021-05-18 DIAGNOSIS — E139 Other specified diabetes mellitus without complications: Secondary | ICD-10-CM | POA: Diagnosis not present

## 2021-05-18 DIAGNOSIS — R809 Proteinuria, unspecified: Secondary | ICD-10-CM | POA: Diagnosis not present

## 2021-05-18 DIAGNOSIS — E042 Nontoxic multinodular goiter: Secondary | ICD-10-CM | POA: Diagnosis not present

## 2021-05-18 DIAGNOSIS — E785 Hyperlipidemia, unspecified: Secondary | ICD-10-CM | POA: Diagnosis not present

## 2021-05-18 DIAGNOSIS — E11649 Type 2 diabetes mellitus with hypoglycemia without coma: Secondary | ICD-10-CM | POA: Diagnosis not present

## 2021-05-19 ENCOUNTER — Other Ambulatory Visit: Payer: Self-pay | Admitting: Internal Medicine

## 2021-05-19 DIAGNOSIS — E042 Nontoxic multinodular goiter: Secondary | ICD-10-CM

## 2021-05-19 DIAGNOSIS — E0590 Thyrotoxicosis, unspecified without thyrotoxic crisis or storm: Secondary | ICD-10-CM

## 2021-05-22 DIAGNOSIS — I10 Essential (primary) hypertension: Secondary | ICD-10-CM | POA: Diagnosis not present

## 2021-05-25 ENCOUNTER — Ambulatory Visit
Admission: RE | Admit: 2021-05-25 | Discharge: 2021-05-25 | Disposition: A | Discharge status: Home / Self Care | Payer: Medicare Other | Source: Ambulatory Visit | Attending: Internal Medicine | Admitting: Internal Medicine

## 2021-05-25 ENCOUNTER — Other Ambulatory Visit: Payer: Self-pay

## 2021-05-25 DIAGNOSIS — E041 Nontoxic single thyroid nodule: Secondary | ICD-10-CM | POA: Diagnosis not present

## 2021-05-25 DIAGNOSIS — I10 Essential (primary) hypertension: Secondary | ICD-10-CM | POA: Diagnosis not present

## 2021-05-25 DIAGNOSIS — E785 Hyperlipidemia, unspecified: Secondary | ICD-10-CM | POA: Diagnosis not present

## 2021-05-25 DIAGNOSIS — E042 Nontoxic multinodular goiter: Secondary | ICD-10-CM

## 2021-05-25 DIAGNOSIS — E0590 Thyrotoxicosis, unspecified without thyrotoxic crisis or storm: Secondary | ICD-10-CM

## 2021-05-25 DIAGNOSIS — E118 Type 2 diabetes mellitus with unspecified complications: Secondary | ICD-10-CM | POA: Diagnosis not present

## 2021-06-01 DIAGNOSIS — E0590 Thyrotoxicosis, unspecified without thyrotoxic crisis or storm: Secondary | ICD-10-CM | POA: Diagnosis not present

## 2021-06-01 DIAGNOSIS — E139 Other specified diabetes mellitus without complications: Secondary | ICD-10-CM | POA: Diagnosis not present

## 2021-06-01 DIAGNOSIS — I700 Atherosclerosis of aorta: Secondary | ICD-10-CM | POA: Diagnosis not present

## 2021-06-01 DIAGNOSIS — E8809 Other disorders of plasma-protein metabolism, not elsewhere classified: Secondary | ICD-10-CM | POA: Diagnosis not present

## 2021-06-01 DIAGNOSIS — I4891 Unspecified atrial fibrillation: Secondary | ICD-10-CM | POA: Diagnosis not present

## 2021-06-01 DIAGNOSIS — I119 Hypertensive heart disease without heart failure: Secondary | ICD-10-CM | POA: Diagnosis not present

## 2021-06-01 DIAGNOSIS — Z23 Encounter for immunization: Secondary | ICD-10-CM | POA: Diagnosis not present

## 2021-06-01 DIAGNOSIS — R911 Solitary pulmonary nodule: Secondary | ICD-10-CM | POA: Diagnosis not present

## 2021-06-01 DIAGNOSIS — Z0000 Encounter for general adult medical examination without abnormal findings: Secondary | ICD-10-CM | POA: Diagnosis not present

## 2021-06-01 DIAGNOSIS — I10 Essential (primary) hypertension: Secondary | ICD-10-CM | POA: Diagnosis not present

## 2021-06-01 DIAGNOSIS — N1832 Chronic kidney disease, stage 3b: Secondary | ICD-10-CM | POA: Diagnosis not present

## 2021-06-01 DIAGNOSIS — F339 Major depressive disorder, recurrent, unspecified: Secondary | ICD-10-CM | POA: Diagnosis not present

## 2021-06-05 ENCOUNTER — Other Ambulatory Visit: Payer: Self-pay | Admitting: Internal Medicine

## 2021-06-05 DIAGNOSIS — R911 Solitary pulmonary nodule: Secondary | ICD-10-CM

## 2021-06-05 DIAGNOSIS — IMO0001 Reserved for inherently not codable concepts without codable children: Secondary | ICD-10-CM

## 2021-06-12 DIAGNOSIS — Z23 Encounter for immunization: Secondary | ICD-10-CM | POA: Diagnosis not present

## 2021-06-14 DIAGNOSIS — Z1211 Encounter for screening for malignant neoplasm of colon: Secondary | ICD-10-CM | POA: Diagnosis not present

## 2021-06-14 DIAGNOSIS — Z1212 Encounter for screening for malignant neoplasm of rectum: Secondary | ICD-10-CM | POA: Diagnosis not present

## 2021-06-16 DIAGNOSIS — I4891 Unspecified atrial fibrillation: Secondary | ICD-10-CM | POA: Diagnosis not present

## 2021-06-16 DIAGNOSIS — E785 Hyperlipidemia, unspecified: Secondary | ICD-10-CM | POA: Diagnosis not present

## 2021-06-16 DIAGNOSIS — M810 Age-related osteoporosis without current pathological fracture: Secondary | ICD-10-CM | POA: Diagnosis not present

## 2021-06-16 DIAGNOSIS — I119 Hypertensive heart disease without heart failure: Secondary | ICD-10-CM | POA: Diagnosis not present

## 2021-06-21 DIAGNOSIS — I10 Essential (primary) hypertension: Secondary | ICD-10-CM | POA: Diagnosis not present

## 2021-06-23 ENCOUNTER — Inpatient Hospital Stay: Admission: RE | Admit: 2021-06-23 | Payer: Medicare Other | Source: Ambulatory Visit

## 2021-07-05 DIAGNOSIS — H35033 Hypertensive retinopathy, bilateral: Secondary | ICD-10-CM | POA: Diagnosis not present

## 2021-07-05 DIAGNOSIS — H35013 Changes in retinal vascular appearance, bilateral: Secondary | ICD-10-CM | POA: Diagnosis not present

## 2021-07-05 DIAGNOSIS — H524 Presbyopia: Secondary | ICD-10-CM | POA: Diagnosis not present

## 2021-07-05 DIAGNOSIS — H25013 Cortical age-related cataract, bilateral: Secondary | ICD-10-CM | POA: Diagnosis not present

## 2021-07-05 DIAGNOSIS — E113293 Type 2 diabetes mellitus with mild nonproliferative diabetic retinopathy without macular edema, bilateral: Secondary | ICD-10-CM | POA: Diagnosis not present

## 2021-07-05 DIAGNOSIS — H2513 Age-related nuclear cataract, bilateral: Secondary | ICD-10-CM | POA: Diagnosis not present

## 2021-07-17 DIAGNOSIS — F339 Major depressive disorder, recurrent, unspecified: Secondary | ICD-10-CM | POA: Diagnosis not present

## 2021-07-17 DIAGNOSIS — R911 Solitary pulmonary nodule: Secondary | ICD-10-CM | POA: Diagnosis not present

## 2021-07-17 DIAGNOSIS — M810 Age-related osteoporosis without current pathological fracture: Secondary | ICD-10-CM | POA: Diagnosis not present

## 2021-07-17 DIAGNOSIS — I10 Essential (primary) hypertension: Secondary | ICD-10-CM | POA: Diagnosis not present

## 2021-07-17 DIAGNOSIS — E785 Hyperlipidemia, unspecified: Secondary | ICD-10-CM | POA: Diagnosis not present

## 2021-07-17 DIAGNOSIS — I4891 Unspecified atrial fibrillation: Secondary | ICD-10-CM | POA: Diagnosis not present

## 2021-07-17 DIAGNOSIS — I119 Hypertensive heart disease without heart failure: Secondary | ICD-10-CM | POA: Diagnosis not present

## 2021-07-19 DIAGNOSIS — L84 Corns and callosities: Secondary | ICD-10-CM | POA: Diagnosis not present

## 2021-07-19 DIAGNOSIS — B351 Tinea unguium: Secondary | ICD-10-CM | POA: Diagnosis not present

## 2021-07-19 DIAGNOSIS — E1142 Type 2 diabetes mellitus with diabetic polyneuropathy: Secondary | ICD-10-CM | POA: Diagnosis not present

## 2021-07-20 ENCOUNTER — Other Ambulatory Visit: Payer: Self-pay | Admitting: Internal Medicine

## 2021-07-20 ENCOUNTER — Other Ambulatory Visit: Payer: Self-pay | Admitting: Family Medicine

## 2021-07-20 DIAGNOSIS — J984 Other disorders of lung: Secondary | ICD-10-CM

## 2021-07-28 ENCOUNTER — Other Ambulatory Visit: Payer: Self-pay | Admitting: Internal Medicine

## 2021-07-28 DIAGNOSIS — Z09 Encounter for follow-up examination after completed treatment for conditions other than malignant neoplasm: Secondary | ICD-10-CM

## 2021-08-04 ENCOUNTER — Ambulatory Visit
Admission: RE | Admit: 2021-08-04 | Discharge: 2021-08-04 | Disposition: A | Discharge status: Home / Self Care | Payer: Medicare Other | Source: Ambulatory Visit | Attending: Internal Medicine | Admitting: Internal Medicine

## 2021-08-04 ENCOUNTER — Other Ambulatory Visit: Payer: Self-pay

## 2021-08-04 DIAGNOSIS — F17210 Nicotine dependence, cigarettes, uncomplicated: Secondary | ICD-10-CM | POA: Diagnosis not present

## 2021-08-04 DIAGNOSIS — J984 Other disorders of lung: Secondary | ICD-10-CM

## 2021-08-17 ENCOUNTER — Ambulatory Visit: Payer: Medicare Other

## 2021-08-17 ENCOUNTER — Ambulatory Visit: Payer: Medicare Other | Admitting: Cardiology

## 2021-08-17 ENCOUNTER — Encounter: Payer: Self-pay | Admitting: Cardiology

## 2021-08-17 ENCOUNTER — Other Ambulatory Visit: Payer: Self-pay

## 2021-08-17 VITALS — BP 173/75 | HR 61 | Temp 98.1°F | Resp 17 | Ht 66.0 in | Wt 184.4 lb

## 2021-08-17 DIAGNOSIS — I10 Essential (primary) hypertension: Secondary | ICD-10-CM | POA: Diagnosis not present

## 2021-08-17 DIAGNOSIS — I480 Paroxysmal atrial fibrillation: Secondary | ICD-10-CM

## 2021-08-17 DIAGNOSIS — I272 Pulmonary hypertension, unspecified: Secondary | ICD-10-CM

## 2021-08-17 NOTE — Progress Notes (Signed)
Follow up visit  Subjective:   Carol Valenzuela, female    DOB: March 18, 1951, 70 y.o.   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  She has noticed occasional leg edema.  She is on amlodipine 10 mg daily.  Blood pressures at home are very well controlled, and are always elevated at physician visit.  Current Outpatient Medications on File Prior to Visit  Medication Sig Dispense Refill   amLODipine (NORVASC) 10 MG tablet Take 1 tablet by mouth daily.     apixaban (ELIQUIS) 5 MG TABS tablet Take 5 mg by mouth 2 (two) times daily.     atorvastatin (LIPITOR) 40 MG tablet TAKE 1 TABLET EVERY DAY 90 tablet 2   buPROPion (WELLBUTRIN XL) 150 MG 24 hr tablet Take 150 mg by mouth at bedtime.     calcium-vitamin D (OSCAL-500) 500-400 MG-UNIT tablet Take 1 tablet by mouth daily.     cholecalciferol (VITAMIN D) 400 units TABS tablet Take 1,000 Units by mouth daily.      insulin aspart (NOVOLOG) 100 UNIT/ML FlexPen Inject into the skin 3 (three) times daily with meals.     Insulin Degludec 100 UNIT/ML SOLN Inject 12 Units into the skin daily.     lisinopril (PRINIVIL,ZESTRIL) 40 MG tablet Take 20 mg by mouth daily.      metFORMIN (GLUCOPHAGE) 500 MG tablet Take 500 mg by mouth 2 (two) times daily.     methimazole (TAPAZOLE) 5 MG tablet Take 5 mg by mouth daily.     metoprolol succinate (TOPROL-XL) 50 MG 24 hr tablet Take 50 mg by mouth daily. Take with or immediately following a meal.     multivitamin-iron-minerals-folic acid (CENTRUM) chewable tablet Chew 1 tablet by mouth daily.     Nutritional Supplements (SALMON OIL) CAPS See admin instructions.     Omega-3 Fatty Acids (FISH OIL)  1000 MG CAPS Take 1 capsule by mouth daily.     No current facility-administered medications on file prior to visit.    Cardiovascular & other pertient studies:  EKG 08/17/2021: Sinus rhythm 61 bpm  Right bundle branch block  Echocardiogram 08/17/2021: Left ventricle cavity is normal in size. Mild concentric hypertrophy of the left ventricle. Normal global wall motion. Normal LV systolic function with EF 61%. Doppler evidence of grade I (impaired) diastolic dysfunction, normal LAP.  Left atrial cavity is severely dilated. Structurally normal trileaflet aortic valve.  Mild (Grade I) aortic regurgitation. Structurally normal mitral valve.  Mild to moderate mitral regurgitation. Structurally normal tricuspid valve.  Mild tricuspid regurgitation. Estimated pulmonary artery systolic pressure 37 mmHg. Previous study on 08/04/2020 noted grade II diastolic dysfunction, mild RA enlargement, estimated PASP 44 mmHg.  CT chest 08/05/2019: 1. Lung-RADS 2S, benign appearance or behavior. Continue annual screening with low-dose chest CT without contrast in 12 months. 2. The "S" modifier above refers to potentially clinically significant non lung cancer related findings. Specifically, there is aortic atherosclerosis, in addition to left anterior descending coronary artery disease. Please note that although the presence of coronary artery calcium documents the presence of coronary artery disease, the severity of this disease and any potential stenosis cannot be assessed on this non-gated CT examination. Assessment  for potential risk factor modification, dietary therapy or pharmacologic therapy may be warranted, if clinically indicated. 3. Mild diffuse bronchial wall thickening with mild centrilobular and paraseptal emphysema; imaging findings suggestive of underlying COPD.   Aortic Atherosclerosis (ICD10-I70.0) and Emphysema (ICD10-J43.9).  Echocardiogram 08/04/2020:  Normal LV systolic function  with visual EF 60-65%. Left ventricle cavity  is normal in size. Normal global wall motion. Doppler evidence of grade II  (pseudonormal) diastolic dysfunction, elevated LAP.  Left atrial cavity is severely dilated.  Right atrial cavity is mildly dilated.  Trace aortic regurgitation.  Mild (Grade I) mitral regurgitation.  Moderate tricuspid regurgitation. Mild pulmonary hypertension. RVSP  measures 44 mmHg.  IVC is dilated with a respiratory response of >50%.  Compared to prior study dated 12/31/2019 no significant change.   US thyroid 05/2020: 1. Thyromegaly with bilateral nodules. Recommend FNA biopsy of moderately suspicious 1.9 cm inferior left nodule. 2. Recommend annual/biennial ultrasound follow-up of additional nodules as above, until stability x5 years confirmed.  Recent labs: 05/20/2020: eGFR 57 Chol 191, TG 130, HDL 92, LDL 71  12/24/2019: BUN/Cr 21/1.6. eGFR 65. HbA1C 9.8%  04/13/2019: Glucose 190, BUN/Cr 14/0.9. EGFR 68. Na/K 139/4.3. Albumin 3.3, total protein 6.3. Rest of the CMP normal H/H 13/39. MCV 90. Platelets 213 HbA1C 9.7% Chol 195, TG 82, HDL 90, LDL 89 TSH 3.7 normal   Review of Systems  Constitutional: Positive for malaise/fatigue (Feels "old and slow").  Cardiovascular:  Negative for chest pain, dyspnea on exertion, leg swelling, palpitations and syncope.        Vitals:   08/17/21 1217  BP: (!) 173/75  Pulse: 61  Resp: 17  Temp: 98.1 F (36.7 C)  SpO2: 99%     Body mass index is 29.76 kg/m. Filed Weights   08/17/21 1217  Weight: 184 lb 6.4 oz (83.6 kg)     Objective:   Physical Exam Vitals and nursing note reviewed.  Constitutional:      General: She is not in acute distress. Neck:     Vascular: No JVD.  Cardiovascular:     Rate and Rhythm: Normal rate and regular rhythm.     Pulses: Intact distal pulses.     Heart sounds: Normal heart sounds. No murmur heard. Pulmonary:     Effort: Pulmonary effort is normal.     Breath  sounds: Normal breath sounds. No wheezing or rales.        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 No angina symptoms. Not on aspirin due to ongoing use of eliquis. Continue metoprolol. Lipids well controlled.  Paroxysmal atrial fibrillation: In sinus rhythm today. Continue anticoagulation with Eliquis 5 mg twice daily for CHADSVASc Score of 5.  Continue metoprolol XL 50 mg once daily.  Hypertension: BP elevated today.  Suspect whitecoat hypertension.  Blood pressures at home are low normal. Suspecting that her edema could be from amlodipine, I will reduce it to 5 mg daily.  Hyperthyroidism: Continue f/u w/Endorcinology.    MR, TR, pulmonary hypertension: Likely driven by hypertnesion, as well as paroxysmal Afib. 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 Asymptomatic at this time. Stable echocardiogram findings, reviewed today.  Type II diabetes mellitus: Well controlled.  Followed by endocrinologist Dr. Garnet Koyanagi.  F/u in 6 months    Destenie Ingber Esther Hardy, MD [facility] [place] Pager: [phone]  Office: [phone]

## 2021-08-18 ENCOUNTER — Other Ambulatory Visit: Payer: 59

## 2021-08-18 ENCOUNTER — Ambulatory Visit: Payer: 59 | Admitting: Cardiology

## 2021-08-25 ENCOUNTER — Other Ambulatory Visit: Payer: Self-pay | Admitting: *Deleted

## 2021-08-25 DIAGNOSIS — Z87891 Personal history of nicotine dependence: Secondary | ICD-10-CM

## 2021-08-25 DIAGNOSIS — F17210 Nicotine dependence, cigarettes, uncomplicated: Secondary | ICD-10-CM

## 2021-09-06 DIAGNOSIS — I10 Essential (primary) hypertension: Secondary | ICD-10-CM | POA: Diagnosis not present

## 2021-09-06 DIAGNOSIS — Z01419 Encounter for gynecological examination (general) (routine) without abnormal findings: Secondary | ICD-10-CM | POA: Diagnosis not present

## 2021-09-12 DIAGNOSIS — F17210 Nicotine dependence, cigarettes, uncomplicated: Secondary | ICD-10-CM | POA: Diagnosis not present

## 2021-09-12 DIAGNOSIS — E11649 Type 2 diabetes mellitus with hypoglycemia without coma: Secondary | ICD-10-CM | POA: Diagnosis not present

## 2021-09-12 DIAGNOSIS — E118 Type 2 diabetes mellitus with unspecified complications: Secondary | ICD-10-CM | POA: Diagnosis not present

## 2021-09-12 DIAGNOSIS — Z6828 Body mass index (BMI) 28.0-28.9, adult: Secondary | ICD-10-CM | POA: Diagnosis not present

## 2021-09-12 DIAGNOSIS — I10 Essential (primary) hypertension: Secondary | ICD-10-CM | POA: Diagnosis not present

## 2021-09-12 DIAGNOSIS — E785 Hyperlipidemia, unspecified: Secondary | ICD-10-CM | POA: Diagnosis not present

## 2021-09-12 DIAGNOSIS — I4891 Unspecified atrial fibrillation: Secondary | ICD-10-CM | POA: Diagnosis not present

## 2021-09-12 DIAGNOSIS — R809 Proteinuria, unspecified: Secondary | ICD-10-CM | POA: Diagnosis not present

## 2021-09-12 DIAGNOSIS — E042 Nontoxic multinodular goiter: Secondary | ICD-10-CM | POA: Diagnosis not present

## 2021-09-12 DIAGNOSIS — E0590 Thyrotoxicosis, unspecified without thyrotoxic crisis or storm: Secondary | ICD-10-CM | POA: Diagnosis not present

## 2021-09-12 DIAGNOSIS — E139 Other specified diabetes mellitus without complications: Secondary | ICD-10-CM | POA: Diagnosis not present

## 2021-09-12 DIAGNOSIS — E010 Iodine-deficiency related diffuse (endemic) goiter: Secondary | ICD-10-CM | POA: Diagnosis not present

## 2021-09-14 ENCOUNTER — Other Ambulatory Visit: Payer: Self-pay | Admitting: Internal Medicine

## 2021-09-14 DIAGNOSIS — Z09 Encounter for follow-up examination after completed treatment for conditions other than malignant neoplasm: Secondary | ICD-10-CM

## 2021-09-14 DIAGNOSIS — R599 Enlarged lymph nodes, unspecified: Secondary | ICD-10-CM

## 2021-09-15 ENCOUNTER — Ambulatory Visit
Admission: RE | Admit: 2021-09-15 | Discharge: 2021-09-15 | Disposition: A | Discharge status: Home / Self Care | Payer: Medicare Other | Source: Ambulatory Visit | Attending: Internal Medicine | Admitting: Internal Medicine

## 2021-09-15 DIAGNOSIS — Z09 Encounter for follow-up examination after completed treatment for conditions other than malignant neoplasm: Secondary | ICD-10-CM

## 2021-09-15 DIAGNOSIS — R922 Inconclusive mammogram: Secondary | ICD-10-CM | POA: Diagnosis not present

## 2021-09-21 DIAGNOSIS — I10 Essential (primary) hypertension: Secondary | ICD-10-CM | POA: Diagnosis not present

## 2021-10-16 DIAGNOSIS — Z23 Encounter for immunization: Secondary | ICD-10-CM | POA: Diagnosis not present

## 2021-10-18 DIAGNOSIS — M79676 Pain in unspecified toe(s): Secondary | ICD-10-CM | POA: Diagnosis not present

## 2021-10-18 DIAGNOSIS — L84 Corns and callosities: Secondary | ICD-10-CM | POA: Diagnosis not present

## 2021-10-18 DIAGNOSIS — E1142 Type 2 diabetes mellitus with diabetic polyneuropathy: Secondary | ICD-10-CM | POA: Diagnosis not present

## 2021-10-18 DIAGNOSIS — B351 Tinea unguium: Secondary | ICD-10-CM | POA: Diagnosis not present

## 2021-11-15 ENCOUNTER — Other Ambulatory Visit: Payer: Self-pay

## 2021-11-15 MED ORDER — AMLODIPINE BESYLATE 5 MG PO TABS: 5.0000 mg | ORAL_TABLET | Freq: Every day | ORAL | 1 refills | Status: DC

## 2021-11-29 ENCOUNTER — Telehealth: Payer: Self-pay | Admitting: Pharmacist

## 2021-11-29 DIAGNOSIS — I10 Essential (primary) hypertension: Secondary | ICD-10-CM

## 2021-11-30 MED ORDER — SPIRONOLACTONE 25 MG PO TABS: 25.0000 mg | ORAL_TABLET | Freq: Every day | ORAL | 3 refills | Status: DC

## 2021-11-30 NOTE — Telephone Encounter (Signed)
Agree  Thanks MJP  

## 2021-12-14 DIAGNOSIS — E0590 Thyrotoxicosis, unspecified without thyrotoxic crisis or storm: Secondary | ICD-10-CM | POA: Diagnosis not present

## 2021-12-14 DIAGNOSIS — F17210 Nicotine dependence, cigarettes, uncomplicated: Secondary | ICD-10-CM | POA: Diagnosis not present

## 2021-12-14 DIAGNOSIS — E118 Type 2 diabetes mellitus with unspecified complications: Secondary | ICD-10-CM | POA: Diagnosis not present

## 2021-12-14 DIAGNOSIS — E785 Hyperlipidemia, unspecified: Secondary | ICD-10-CM | POA: Diagnosis not present

## 2021-12-14 DIAGNOSIS — I10 Essential (primary) hypertension: Secondary | ICD-10-CM | POA: Diagnosis not present

## 2021-12-14 DIAGNOSIS — E139 Other specified diabetes mellitus without complications: Secondary | ICD-10-CM | POA: Diagnosis not present

## 2021-12-14 DIAGNOSIS — R809 Proteinuria, unspecified: Secondary | ICD-10-CM | POA: Diagnosis not present

## 2021-12-14 DIAGNOSIS — I4891 Unspecified atrial fibrillation: Secondary | ICD-10-CM | POA: Diagnosis not present

## 2021-12-14 DIAGNOSIS — E11649 Type 2 diabetes mellitus with hypoglycemia without coma: Secondary | ICD-10-CM | POA: Diagnosis not present

## 2021-12-14 DIAGNOSIS — E042 Nontoxic multinodular goiter: Secondary | ICD-10-CM | POA: Diagnosis not present

## 2021-12-15 DIAGNOSIS — M810 Age-related osteoporosis without current pathological fracture: Secondary | ICD-10-CM | POA: Diagnosis not present

## 2021-12-15 DIAGNOSIS — I4891 Unspecified atrial fibrillation: Secondary | ICD-10-CM | POA: Diagnosis not present

## 2021-12-15 DIAGNOSIS — I119 Hypertensive heart disease without heart failure: Secondary | ICD-10-CM | POA: Diagnosis not present

## 2021-12-15 DIAGNOSIS — E785 Hyperlipidemia, unspecified: Secondary | ICD-10-CM | POA: Diagnosis not present

## 2021-12-21 NOTE — Telephone Encounter (Signed)
Called to follow up with pt regarding missing labs following starting spironolactone. Home BP continues to remain significantly elevated. Avg BP over the past week of 178/97 HR: 56. Pt reports that she picked up her spironolactone 25 mg, but only took it for 2-3 days before stopping it on her own. Still having significant lower extremity edema . Pt agreeable to restart her spironolactone and increase her dose to 50 mg to help with her lower extremity edema and elevated BP. Pt scheduled to come in for an OV with Dr. Virgina Jock on 12/29/21. Pt to get labs completed prior to upcoming OV.   ?

## 2021-12-25 ENCOUNTER — Other Ambulatory Visit: Payer: Self-pay | Admitting: Pharmacist

## 2021-12-25 DIAGNOSIS — I10 Essential (primary) hypertension: Secondary | ICD-10-CM

## 2021-12-26 DIAGNOSIS — I10 Essential (primary) hypertension: Secondary | ICD-10-CM | POA: Diagnosis not present

## 2021-12-27 ENCOUNTER — Telehealth: Payer: Self-pay | Admitting: Pharmacist

## 2021-12-27 LAB — BASIC METABOLIC PANEL
BUN/Creatinine Ratio: 16 (ref 12–28)
BUN: 32 mg/dL — ABNORMAL HIGH (ref 8–27)
CO2: 27 mmol/L (ref 20–29)
Calcium: 8.4 mg/dL — ABNORMAL LOW (ref 8.7–10.3)
Chloride: 103 mmol/L (ref 96–106)
Creatinine, Ser: 2.06 mg/dL — ABNORMAL HIGH (ref 0.57–1.00)
Glucose: 80 mg/dL (ref 70–99)
Potassium: 4.2 mmol/L (ref 3.5–5.2)
Sodium: 140 mmol/L (ref 134–144)
eGFR: 25 mL/min/{1.73_m2} — ABNORMAL LOW (ref >59)

## 2021-12-27 NOTE — Telephone Encounter (Signed)
Agree  Thanks MJP  

## 2021-12-27 NOTE — Telephone Encounter (Signed)
Lab results following recent spironolactone 50 mg start reviewed with pt. Renal function significantly elevated since starting spironolactone. No significant improvement in her home BP readings and lower extremity swelling concern since being on spironolactone. Recommended pt stop spironolactone due to worsening kidney function. Pt scheduled for OV with Dr. Virgina Jock this Friday. Pt to bring in her home medications with her to the upcoming OV. Denies any complains of severe CP, HA, numbness, weakness. Currently taking lisinopril 20 mg, metoprolol 50 mg. Previously tried and failed amlodipine in setting of worsening lower extremity swelling. May consider switching metoprolol to labetalol 200 mg BID at upcoming OV if no other acute symptoms.  ?

## 2021-12-29 ENCOUNTER — Ambulatory Visit: Payer: Medicare Other | Admitting: Cardiology

## 2021-12-29 ENCOUNTER — Encounter: Payer: Self-pay | Admitting: Cardiology

## 2021-12-29 VITALS — BP 183/77 | HR 55 | Temp 97.5°F | Resp 16 | Ht 66.0 in | Wt 190.0 lb

## 2021-12-29 DIAGNOSIS — R600 Localized edema: Secondary | ICD-10-CM | POA: Insufficient documentation

## 2021-12-29 DIAGNOSIS — I272 Pulmonary hypertension, unspecified: Secondary | ICD-10-CM

## 2021-12-29 DIAGNOSIS — I10 Essential (primary) hypertension: Secondary | ICD-10-CM | POA: Diagnosis not present

## 2021-12-29 DIAGNOSIS — I480 Paroxysmal atrial fibrillation: Secondary | ICD-10-CM | POA: Diagnosis not present

## 2021-12-29 MED ORDER — LABETALOL HCL 100 MG PO TABS: 100.0000 mg | ORAL_TABLET | Freq: Two times a day (BID) | ORAL | 3 refills | Status: DC

## 2021-12-29 MED ORDER — FUROSEMIDE 40 MG PO TABS: 40.0000 mg | ORAL_TABLET | Freq: Two times a day (BID) | ORAL | 3 refills | Status: DC

## 2021-12-29 NOTE — Progress Notes (Signed)
? ?Follow up visit ? ?Subjective:  ? ?Carol Valenzuela, female    DOB: [birth year], 71 y.o.   MRN: [id number] ? ?HPI ? ? ?Chief Complaint  ?Patient presents with  ? Hypertension  ? Edema  ? Follow-up  ? ? 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 ?Recent increase in Cr to 2.06 after starting spironolactone 50 mg daily. Leg edema and hypertension persist.  She has noticed worsening leg edema symptoms.  Blood pressure is elevated today.  ? ? ?Current Outpatient Medications on File Prior to Visit  ?Medication Sig Dispense Refill  ? apixaban (ELIQUIS) 5 MG TABS tablet Take 5 mg by mouth 2 (two) times daily.    ? atorvastatin (LIPITOR) 40 MG tablet TAKE 1 TABLET EVERY DAY 90 tablet 2  ? buPROPion (WELLBUTRIN XL) 150 MG 24 hr tablet Take 150 mg by mouth at bedtime.    ? calcium-vitamin D (OSCAL-500) 500-400 MG-UNIT tablet Take 1 tablet by mouth daily.    ? cholecalciferol (VITAMIN D) 400 units TABS tablet Take 1,000 Units by mouth daily.     ? insulin aspart (NOVOLOG) 100 UNIT/ML FlexPen Inject into the skin 3 (three) times daily with meals.    ? Insulin Degludec 100 UNIT/ML SOLN Inject 12 Units into the skin daily.    ? lisinopril (PRINIVIL,ZESTRIL) 40 MG tablet Take 20 mg by mouth daily.     ? metFORMIN (GLUCOPHAGE) 500 MG tablet Take 500 mg by mouth 2 (two) times daily.    ? methimazole (TAPAZOLE) 5 MG tablet Take 5 mg by mouth daily.    ? metoprolol succinate (TOPROL-XL) 50 MG 24 hr tablet Take 50 mg by mouth daily. Take with or immediately following a meal.    ? multivitamin-iron-minerals-folic acid (CENTRUM) chewable tablet Chew 1 tablet by mouth daily.    ? Nutritional Supplements (SALMON OIL) CAPS See admin instructions.    ? Omega-3 Fatty Acids (FISH OIL) 1000 MG CAPS Take 1 capsule by mouth daily.    ? ?No current facility-administered medications on file prior to visit.  ? ? ?Cardiovascular & other  pertient studies: ? ?EKG 08/17/2021: ?Sinus rhythm 61 bpm  ?Right bundle branch block ? ?Echocardiogram 08/17/2021: ?Left ventricle cavity is normal in size. Mild concentric hypertrophy of the left ventricle. Normal global wall motion. Normal LV systolic function with EF 61%. Doppler evidence of grade I (impaired) diastolic dysfunction, normal LAP.  ?Left atrial cavity is severely dilated. ?Structurally normal trileaflet aortic valve.  Mild (Grade I) aortic regurgitation. ?Structurally normal mitral valve.  Mild to moderate mitral regurgitation. ?Structurally normal tricuspid valve.  Mild tricuspid regurgitation. Estimated pulmonary artery systolic pressure 37 mmHg. ?Previous study on 08/04/2020 noted grade II diastolic dysfunction, mild RA enlargement, estimated PASP 44 mmHg. ? ?CT chest 08/05/2019: ?1. Lung-RADS 2S, benign appearance or behavior. Continue annual ?screening with low-dose chest CT without contrast in 12 months. ?2. The "S" modifier above refers to potentially clinically ?significant non lung cancer related findings. Specifically, there is ?aortic atherosclerosis, in addition to left anterior descending ?coronary artery disease. Please note that although the presence of ?coronary artery calcium documents the presence of coronary artery ?disease, the severity of this disease and any potential stenosis ?cannot be assessed on this non-gated CT examination. Assessment for ?potential risk factor modification, dietary therapy or pharmacologic ?therapy may be warranted, if clinically indicated. ?3. Mild diffuse bronchial wall thickening with mild centrilobular ?and paraseptal emphysema; imaging  findings suggestive of underlying ?COPD. ?  ?Aortic Atherosclerosis (ICD10-I70.0) and Emphysema (ICD10-J43.9). ? ?US thyroid 05/2020: ?1. Thyromegaly with bilateral nodules. Recommend FNA biopsy of ?moderately suspicious 1.9 cm inferior left nodule. ?2. Recommend annual/biennial ultrasound follow-up of  additional ?nodules as above, until stability x5 years confirmed. ? ?Recent labs: ?12/22/2021: ?Glucose 80, BUN/Cr 32/2.06. EGFR 25. Na/K 140/4.2.  ? ?05/20/2020: ?eGFR 57 ?Chol 191, TG 130, HDL 92, LDL 71 ? ?12/24/2019: ?BUN/Cr 21/1.6. eGFR 65. ?HbA1C 9.8% ? ?04/13/2019: ?Glucose 190, BUN/Cr 14/0.9. EGFR 68. Na/K 139/4.3. Albumin 3.3, total protein 6.3. Rest of the CMP normal ?H/H 13/39. MCV 90. Platelets 213 ?HbA1C 9.7% ?Chol 195, TG 82, HDL 90, LDL 89 ?TSH 3.7 normal ? ? ?Review of Systems  ?Constitutional: Positive for malaise/fatigue (Feels "old and slow").  ?Cardiovascular:  Positive for leg swelling. Negative for chest pain, dyspnea on exertion, palpitations and syncope.  ? ?   ? ? ?Vitals:  ? 12/29/21 1106 12/29/21 1153  ?BP: (!) 205/91 (!) 183/77  ?Pulse: (!) 59 (!) 55  ?Resp: 16   ?Temp: (!) 97.5 ?F (36.4 ?C)   ?SpO2: 96%   ? ? ? ?Body mass index is 30.67 kg/m?. ?Filed Weights  ? 12/29/21 1106  ?Weight: 190 lb (86.2 kg)  ? ? ? ?Objective:  ? Physical Exam ?Vitals and nursing note reviewed.  ?Constitutional:   ?   General: She is not in acute distress. ?Neck:  ?   Vascular: No JVD.  ?Cardiovascular:  ?   Rate and Rhythm: Normal rate and regular rhythm.  ?   Pulses: Intact distal pulses.  ?   Heart sounds: Normal heart sounds. No murmur heard. ?Pulmonary:  ?   Effort: Pulmonary effort is normal.  ?   Breath sounds: Normal breath sounds. No wheezing or rales.  ?Musculoskeletal:  ?   Right lower leg: No edema.  ?   Left lower leg: No edema.  ? ? ? ?   ?Assessment & Recommendations:  ? 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 ?AKI: ?Cr 2.06 due to spironolactone. Now stopped.  ?Given her volume overload and hypertension, started on Lasix 40 mg twice daily. ?Repeat labs in a week ? ?Coronary atherosclerosis: ?LAD calcification seen on lung cancer CT chest. ?No angina symptoms. ?Not  on aspirin due to ongoing use of eliquis. ?Continue metoprolol. ?Lipids well controlled. ? ?Paroxysmal atrial fibrillation: ?In sinus rhythm today. Continue anticoagulation with Eliquis 5 mg twice daily for CHADSVASc Score of 5.  See below regarding beta-blocker. ? ?Hypertension: ?Uncontrolled, without any hypertensive emergency signs/symptoms.   ?Change metoprolol succinate 50 mg daily for labetalol 200 mg twice daily.   ?Also added Lasix 40 mg twice daily given volume overload.   ?Continue lisinopril for now.  Need to monitor renal function closely.   ? ?Hyperthyroidism: ?Continue f/u w/Endorcinology.  ?  ?MR, TR, pulmonary hypertension: ?Likely driven by hypertnesion, as well as paroxysmal Afib. 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 ? ?Type II diabetes mellitus: ?Well controlled.  Followed by endocrinologist Dr. Garnet Koyanagi. ? ?F/u 2-3 weeks with Selinda Flavin, [place] for hypertension medication management.  I will follow her up soon thereafter.   ? ?Nigel Mormon, MD ?[facility] ?Pager: [phone] ?Office: [phone] ?   ?

## 2022-01-09 DIAGNOSIS — I10 Essential (primary) hypertension: Secondary | ICD-10-CM | POA: Diagnosis not present

## 2022-01-09 DIAGNOSIS — R600 Localized edema: Secondary | ICD-10-CM | POA: Diagnosis not present

## 2022-01-11 ENCOUNTER — Telehealth: Payer: Self-pay | Admitting: Pharmacist

## 2022-01-11 DIAGNOSIS — I10 Essential (primary) hypertension: Secondary | ICD-10-CM

## 2022-01-11 LAB — BASIC METABOLIC PANEL
BUN/Creatinine Ratio: 12 (ref 12–28)
BUN: 25 mg/dL (ref 8–27)
CO2: 26 mmol/L (ref 20–29)
Calcium: 8.6 mg/dL — ABNORMAL LOW (ref 8.7–10.3)
Chloride: 97 mmol/L (ref 96–106)
Creatinine, Ser: 2.11 mg/dL — ABNORMAL HIGH (ref 0.57–1.00)
Glucose: 198 mg/dL — ABNORMAL HIGH (ref 70–99)
Potassium: 4.3 mmol/L (ref 3.5–5.2)
Sodium: 136 mmol/L (ref 134–144)
eGFR: 24 mL/min/{1.73_m2} — ABNORMAL LOW (ref >59)

## 2022-01-11 LAB — BRAIN NATRIURETIC PEPTIDE: BNP: 272 pg/mL — ABNORMAL HIGH (ref 0.0–100.0)

## 2022-01-11 NOTE — Telephone Encounter (Signed)
Called and reviewed lab results with pt. Renal function continues to remain elevated above baseline. Previous PCP labs from 05/25/21: Na: 139, K: 4.9, eGFR: 35, Scr: 1.48, BUN: 33. Pt notes that she hasnt noticed significant improvement in her lower extremity edema despite starting lasix 40 mg BID. Home BP continues to remain significantly elevated with 2 week Avg of 174/96 HR: 61. Pt currently doesn't have an established nephrologist relationship but pt recommended to establish one with PCP office. Will fax over the labs to PCP office  ?

## 2022-01-11 NOTE — Telephone Encounter (Signed)
Recommend adding Bidil 20-37.5 mg tid. If no improvement in BP and/or leg edema by early next week, recommend hospitalization for diuresis and inpatient nephrology consult. ? ?? ?Nigel Mormon, MD ?Pager: [phone] ?Office: [phone] ?

## 2022-01-12 ENCOUNTER — Ambulatory Visit: Payer: 59

## 2022-01-12 MED ORDER — ISOSORBIDE DINITRATE 30 MG PO TABS: 30.0000 mg | ORAL_TABLET | Freq: Three times a day (TID) | ORAL | 1 refills | Status: DC

## 2022-01-12 MED ORDER — HYDRALAZINE HCL 50 MG PO TABS: 50.0000 mg | ORAL_TABLET | Freq: Three times a day (TID) | ORAL | 1 refills | Status: DC

## 2022-01-12 NOTE — Addendum Note (Signed)
Addended by: Manuela Schwartz T on: 01/12/2022 03:46 PM ? ? Modules accepted: Orders ? ?

## 2022-01-15 DIAGNOSIS — N184 Chronic kidney disease, stage 4 (severe): Secondary | ICD-10-CM | POA: Diagnosis not present

## 2022-01-15 DIAGNOSIS — R600 Localized edema: Secondary | ICD-10-CM | POA: Diagnosis not present

## 2022-01-15 DIAGNOSIS — I10 Essential (primary) hypertension: Secondary | ICD-10-CM | POA: Diagnosis not present

## 2022-01-15 NOTE — Telephone Encounter (Signed)
If no improvement in blood pressure and/or leg edema, could consider elective admission. Please keep me posted. ? ?Thanks ?MJP ? ?

## 2022-01-16 ENCOUNTER — Telehealth: Payer: Self-pay | Admitting: Pharmacist

## 2022-01-16 NOTE — Telephone Encounter (Signed)
Called to follow up with pt following recent start hydralazine 50 mg TID and isosorbide 30 mg TID. Pt started new therapy on Saturday 01/14/22. BP still elevated but improving slightly since recent medication changes. Avg BP over the past 5 days of 170/90 HR: 68. BP ranging from 140-208/69-114 mmHg. Pt was seen by her PCP on Monday but isnt sure if there was a referral for a nephrologist placed during the visit. Called to clarify with Dr. Pennie Banter office. Waiting for confirmation.  ? ? ?Lower extremity improved slightly per pt and pt is able to walk a little bit better since. Still attests to having moderate bilateral swelling concerns that are up to her knees. Denies any complains of signifiicant leg pain, bruising, CP, HA, numbness, weakness. Pt notes to have mild lightheadedness, dizziness since starting hydralazine and isosorbide but pt willing to continue current therapy and monitoring if it helps improve her home BP readings.  ? ?Pt does note that she misunderstood previous directions to continue taking lasix and labetalol when isosorbide and hydralazine were added, but pt to start back taking lasix and labetalol starting today.  ? ?Discussed antihypertensive dosing scheduled with pt and listed below ? ?7:00 AM Hydralazine 50 mg, Isosorbide 30 mg, labetalol 100 mg, furosemide 40 mg  ?2:00 PM Hydralazine 50 mg, isosorbide 30 mg, furosemide 40 mg  ?7:00 PM Hydrazine 50 mg, isosorbide 30 mg, labetalol 100 mg, lisinopril 40 mg   ? ?

## 2022-01-16 NOTE — Telephone Encounter (Signed)
Please schedule f/u w/me on 5/12. ? ?Thanks ?MJP ? ?

## 2022-01-21 DIAGNOSIS — I10 Essential (primary) hypertension: Secondary | ICD-10-CM | POA: Diagnosis not present

## 2022-01-22 NOTE — Progress Notes (Signed)
Cr continues to rise. Needs to see nephrology asap. ? ?Thanks ?MJP ? ?

## 2022-01-26 ENCOUNTER — Encounter: Payer: Self-pay | Admitting: Cardiology

## 2022-01-26 ENCOUNTER — Ambulatory Visit: Payer: Medicare Other | Admitting: Cardiology

## 2022-01-26 VITALS — BP 157/93 | HR 68 | Temp 98.0°F | Resp 17 | Ht 66.0 in | Wt 190.0 lb

## 2022-01-26 DIAGNOSIS — N179 Acute kidney failure, unspecified: Secondary | ICD-10-CM | POA: Insufficient documentation

## 2022-01-26 DIAGNOSIS — I10 Essential (primary) hypertension: Secondary | ICD-10-CM

## 2022-01-26 DIAGNOSIS — I480 Paroxysmal atrial fibrillation: Secondary | ICD-10-CM | POA: Diagnosis not present

## 2022-01-26 MED ORDER — HYDRALAZINE HCL 100 MG PO TABS: 100.0000 mg | ORAL_TABLET | Freq: Three times a day (TID) | ORAL | 3 refills | Status: DC

## 2022-01-26 NOTE — Progress Notes (Signed)
? ?Follow up visit ? ?Subjective:  ? ?Carol Valenzuela, female    DOB: 1951-04-12, 71 y.o.   MRN: [id number] ? ?HPI ? ? ?Chief Complaint  ?Patient presents with  ? Hypertension  ? Follow-up  ? Leg Swelling  ? ? 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 ?Patient has continued to have hypertension and leg edema, albeit mildly improved. Cr continues to worsen. She is going to see nephrology soon. ? ? ?Current Outpatient Medications:  ?  apixaban (ELIQUIS) 5 MG TABS tablet, Take 5 mg by mouth 2 (two) times daily., Disp: , Rfl:  ?  atorvastatin (LIPITOR) 40 MG tablet, TAKE 1 TABLET EVERY DAY, Disp: 90 tablet, Rfl: 2 ?  buPROPion (WELLBUTRIN XL) 150 MG 24 hr tablet, Take 150 mg by mouth at bedtime., Disp: , Rfl:  ?  calcium-vitamin D (OSCAL-500) 500-400 MG-UNIT tablet, Take 1 tablet by mouth daily., Disp: , Rfl:  ?  cholecalciferol (VITAMIN D) 400 units TABS tablet, Take 1,000 Units by mouth daily. , Disp: , Rfl:  ?  furosemide (LASIX) 40 MG tablet, Take 1 tablet (40 mg total) by mouth 2 (two) times daily., Disp: 90 tablet, Rfl: 3 ?  hydrALAZINE (APRESOLINE) 50 MG tablet, Take 1 tablet (50 mg total) by mouth 3 (three) times daily., Disp: 90 tablet, Rfl: 1 ?  insulin aspart (NOVOLOG) 100 UNIT/ML FlexPen, Inject into the skin 3 (three) times daily with meals., Disp: , Rfl:  ?  Insulin Degludec 100 UNIT/ML SOLN, Inject 12 Units into the skin daily., Disp: , Rfl:  ?  isosorbide dinitrate (ISORDIL) 30 MG tablet, Take 1 tablet (30 mg total) by mouth 3 (three) times daily., Disp: 90 tablet, Rfl: 1 ?  labetalol (NORMODYNE) 100 MG tablet, Take 1 tablet (100 mg total) by mouth 2 (two) times daily., Disp: 90 tablet, Rfl: 3 ?  lisinopril (PRINIVIL,ZESTRIL) 40 MG tablet, Take 40 mg by mouth daily., Disp: , Rfl:  ?  metFORMIN (GLUCOPHAGE) 500 MG tablet, Take 500 mg by mouth 2 (two) times daily., Disp: , Rfl:  ?  methimazole  (TAPAZOLE) 5 MG tablet, Take 5 mg by mouth daily., Disp: , Rfl:  ?  multivitamin-iron-minerals-folic acid (CENTRUM) chewable tablet, Chew 1 tablet by mouth daily., Disp: , Rfl:  ?  Nutritional Supplements (SALMON OIL) CAPS, See admin instructions., Disp: , Rfl:  ?  Omega-3 Fatty Acids (FISH OIL) 1000 MG CAPS, Take 1 capsule by mouth daily., Disp: , Rfl:  ? ? ? ?Cardiovascular & other pertient studies: ? ?EKG 08/17/2021: ?Sinus rhythm 61 bpm  ?Right bundle branch block ? ?Echocardiogram 08/17/2021: ?Left ventricle cavity is normal in size. Mild concentric hypertrophy of the left ventricle. Normal global wall motion. Normal LV systolic function with EF 61%. Doppler evidence of grade I (impaired) diastolic dysfunction, normal LAP.  ?Left atrial cavity is severely dilated. ?Structurally normal trileaflet aortic valve.  Mild (Grade I) aortic regurgitation. ?Structurally normal mitral valve.  Mild to moderate mitral regurgitation. ?Structurally normal tricuspid valve.  Mild tricuspid regurgitation. Estimated pulmonary artery systolic pressure 37 mmHg. ?Previous study on 08/04/2020 noted grade II diastolic dysfunction, mild RA enlargement, estimated PASP 44 mmHg. ? ?CT chest 08/05/2019: ?1. Lung-RADS 2S, benign appearance or behavior. Continue annual ?screening with low-dose chest CT without contrast in 12 months. ?2. The "S" modifier above refers to potentially clinically ?significant non lung cancer related findings. Specifically, there is ?aortic atherosclerosis, in addition to left  anterior descending ?coronary artery disease. Please note that although the presence of ?coronary artery calcium documents the presence of coronary artery ?disease, the severity of this disease and any potential stenosis ?cannot be assessed on this non-gated CT examination. Assessment for ?potential risk factor modification, dietary therapy or pharmacologic ?therapy may be warranted, if clinically indicated. ?3. Mild diffuse bronchial wall  thickening with mild centrilobular ?and paraseptal emphysema; imaging findings suggestive of underlying ?COPD. ?  ?Aortic Atherosclerosis (ICD10-I70.0) and Emphysema (ICD10-J43.9). ? ?US thyroid 05/2020: ?1. Thyromegaly with bilateral nodules. Recommend FNA biopsy of ?moderately suspicious 1.9 cm inferior left nodule. ?2. Recommend annual/biennial ultrasound follow-up of additional ?nodules as above, until stability x5 years confirmed. ? ?Recent labs: ?01/15/2022: ?Glucose 110, BUN/Cr 28/2.36. EGFR 20. Na/K 139/4.4. Protein 5.2. Albumin 2.0. ? ?01/09/2022: ?Glucose 198, BUN/Cr 25/2.11. EGFR 24. Na/K 136/4.3.  ?BNP 272 ? ?12/22/2021: ?Glucose 80, BUN/Cr 32/2.06. EGFR 25. Na/K 140/4.2.  ? ?05/20/2020: ?eGFR 57 ?Chol 191, TG 130, HDL 92, LDL 71 ? ?12/24/2019: ?BUN/Cr 21/1.6. eGFR 65. ?HbA1C 9.8% ? ?04/13/2019: ?Glucose 190, BUN/Cr 14/0.9. EGFR 68. Na/K 139/4.3. Albumin 3.3, total protein 6.3. Rest of the CMP normal ?H/H 13/39. MCV 90. Platelets 213 ?HbA1C 9.7% ?Chol 195, TG 82, HDL 90, LDL 89 ?TSH 3.7 normal ? ? ?Review of Systems  ?Constitutional: Positive for malaise/fatigue (Feels "old and slow").  ?Cardiovascular:  Positive for leg swelling. Negative for chest pain, dyspnea on exertion, palpitations and syncope.  ? ?   ? ? ?Vitals:  ? 01/26/22 1337  ?BP: (!) 157/93  ?Pulse: 68  ?Resp: 17  ?Temp: 98 ?F (36.7 ?C)  ? ? ? ?Body mass index is 30.67 kg/m?. Danley Danker Weights  ? 01/26/22 1337  ?Weight: 190 lb (86.2 kg)  ? ? ? ?Objective:  ? Physical Exam ?Vitals and nursing note reviewed.  ?Constitutional:   ?   General: She is not in acute distress. ?Neck:  ?   Vascular: No JVD.  ?Cardiovascular:  ?   Rate and Rhythm: Normal rate and regular rhythm.  ?   Pulses: Intact distal pulses.  ?   Heart sounds: Normal heart sounds. No murmur heard. ?Pulmonary:  ?   Effort: Pulmonary effort is normal.  ?   Breath sounds: Normal breath sounds. No wheezing or rales.  ?Musculoskeletal:  ?   Right lower leg: Edema (2+) present.  ?   Left lower leg:  Edema (2+) present.  ? ? ? ?   ?Assessment & Recommendations:  ? 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 ?AKI: ?Cr 2.36. Spironolactone previously stopped. Now, reduce lisinopril to 20 mg daily.  ?Nephrology consult pending.  ?Repeat BMP in 2 weeks. ? ?Depending on nephrology consult, I reckon she may need IV diuresis at some point. ? ?Coronary atherosclerosis: ?LAD calcification seen on lung cancer CT chest. ?No angina symptoms. ?Not on aspirin due to ongoing use of eliquis. ?Continue metoprolol. ?Lipids well controlled. ?Discussed smoking cessation. ? ?Paroxysmal atrial fibrillation: ?In sinus rhythm today. Continue anticoagulation with Eliquis 5 mg twice daily for CHADSVASc Score of 5.  See below regarding beta-blocker. ? ?Hypertension: ?Uncontrolled. ?Increased hydralazine to 100 mg tid, as lisinopril was reduced from 40 mg to 20 mg. ? ?Hyperthyroidism: ?Continue f/u w/Endorcinology.  ?  ?MR, TR, pulmonary hypertension: ?Likely driven by hypertnesion, as well as paroxysmal Afib. 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 ? ?Type II diabetes mellitus: ?Well controlled.  Followed  by endocrinologist Dr. Garnet Koyanagi. ? ?F/u in 4 weeks ? ?Nigel Mormon, MD ?[facility] ?Pager: [phone] ?Office: [phone] ?   ?

## 2022-02-01 ENCOUNTER — Other Ambulatory Visit: Payer: Self-pay | Admitting: Cardiology

## 2022-02-01 DIAGNOSIS — I10 Essential (primary) hypertension: Secondary | ICD-10-CM

## 2022-02-02 DIAGNOSIS — M7989 Other specified soft tissue disorders: Secondary | ICD-10-CM | POA: Diagnosis not present

## 2022-02-02 DIAGNOSIS — S93409A Sprain of unspecified ligament of unspecified ankle, initial encounter: Secondary | ICD-10-CM | POA: Diagnosis not present

## 2022-02-04 ENCOUNTER — Other Ambulatory Visit: Payer: Self-pay | Admitting: Cardiology

## 2022-02-04 DIAGNOSIS — I10 Essential (primary) hypertension: Secondary | ICD-10-CM

## 2022-02-05 DIAGNOSIS — I129 Hypertensive chronic kidney disease with stage 1 through stage 4 chronic kidney disease, or unspecified chronic kidney disease: Secondary | ICD-10-CM | POA: Diagnosis not present

## 2022-02-05 DIAGNOSIS — I272 Pulmonary hypertension, unspecified: Secondary | ICD-10-CM | POA: Diagnosis not present

## 2022-02-05 DIAGNOSIS — E1122 Type 2 diabetes mellitus with diabetic chronic kidney disease: Secondary | ICD-10-CM | POA: Diagnosis not present

## 2022-02-05 DIAGNOSIS — Z720 Tobacco use: Secondary | ICD-10-CM | POA: Diagnosis not present

## 2022-02-05 DIAGNOSIS — N184 Chronic kidney disease, stage 4 (severe): Secondary | ICD-10-CM | POA: Diagnosis not present

## 2022-02-07 ENCOUNTER — Other Ambulatory Visit: Payer: Self-pay | Admitting: Cardiology

## 2022-02-07 DIAGNOSIS — I10 Essential (primary) hypertension: Secondary | ICD-10-CM

## 2022-02-08 ENCOUNTER — Other Ambulatory Visit: Payer: Self-pay

## 2022-02-08 DIAGNOSIS — I10 Essential (primary) hypertension: Secondary | ICD-10-CM

## 2022-02-08 MED ORDER — ISOSORBIDE DINITRATE 30 MG PO TABS: 30.0000 mg | ORAL_TABLET | Freq: Three times a day (TID) | ORAL | 1 refills | Status: DC

## 2022-02-09 DIAGNOSIS — N1832 Chronic kidney disease, stage 3b: Secondary | ICD-10-CM | POA: Diagnosis not present

## 2022-02-13 ENCOUNTER — Telehealth: Payer: Self-pay

## 2022-02-13 NOTE — Telephone Encounter (Signed)
Pt sister called to make sure pt is taking her hydrALAZINE  the right way. Informed pt sister that pt should be taking hydrALAZINE  100mg  TID. Pt sister understood

## 2022-02-14 DIAGNOSIS — I4891 Unspecified atrial fibrillation: Secondary | ICD-10-CM | POA: Diagnosis not present

## 2022-02-14 DIAGNOSIS — I119 Hypertensive heart disease without heart failure: Secondary | ICD-10-CM | POA: Diagnosis not present

## 2022-02-14 DIAGNOSIS — E1142 Type 2 diabetes mellitus with diabetic polyneuropathy: Secondary | ICD-10-CM | POA: Diagnosis not present

## 2022-02-14 DIAGNOSIS — E785 Hyperlipidemia, unspecified: Secondary | ICD-10-CM | POA: Diagnosis not present

## 2022-02-14 DIAGNOSIS — B351 Tinea unguium: Secondary | ICD-10-CM | POA: Diagnosis not present

## 2022-02-14 DIAGNOSIS — M810 Age-related osteoporosis without current pathological fracture: Secondary | ICD-10-CM | POA: Diagnosis not present

## 2022-02-14 DIAGNOSIS — L84 Corns and callosities: Secondary | ICD-10-CM | POA: Diagnosis not present

## 2022-02-14 DIAGNOSIS — M79676 Pain in unspecified toe(s): Secondary | ICD-10-CM | POA: Diagnosis not present

## 2022-02-15 ENCOUNTER — Ambulatory Visit: Payer: 59 | Admitting: Cardiology

## 2022-02-22 NOTE — Progress Notes (Addendum)
Follow up visit  Subjective:   Carol Valenzuela, female    DOB: 1951/04/24, 71 y.o.   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 Cr down to 2.15. She was evaluated by nephrologist Dr. Moshe Cipro. Leg edema improving, weight is lower. Blood pressure remains elevated.   Current Outpatient Medications:    apixaban (ELIQUIS) 5 MG TABS tablet, Take 5 mg by mouth 2 (two) times daily., Disp: , Rfl:    atorvastatin (LIPITOR) 40 MG tablet, TAKE 1 TABLET EVERY DAY, Disp: 90 tablet, Rfl: 2   buPROPion (WELLBUTRIN XL) 150 MG 24 hr tablet, Take 150 mg by mouth at bedtime., Disp: , Rfl:    calcium-vitamin D (OSCAL-500) 500-400 MG-UNIT tablet, Take 1 tablet by mouth daily., Disp: , Rfl:    furosemide (LASIX) 40 MG tablet, TAKE 1 TABLET BY MOUTH TWICE A DAY, Disp: 180 tablet, Rfl: 1   hydrALAZINE (APRESOLINE) 50 MG tablet, TAKE 1 TABLET BY MOUTH THREE TIMES A DAY, Disp: 90 tablet, Rfl: 1   insulin aspart (NOVOLOG) 100 UNIT/ML FlexPen, Inject into the skin 3 (three) times daily with meals., Disp: , Rfl:    insulin degludec (TRESIBA FLEXTOUCH) 100 UNIT/ML FlexTouch Pen, Inject 12 Units into the skin daily., Disp: , Rfl:    Insulin Degludec 100 UNIT/ML SOLN, Inject 12 Units into the skin daily., Disp: , Rfl:    isosorbide dinitrate (ISORDIL) 30 MG tablet, Take 1 tablet (30 mg total) by mouth 3 (three) times daily., Disp: 90 tablet, Rfl: 1   labetalol (NORMODYNE) 100 MG tablet, TAKE 1 TABLET BY MOUTH TWICE A DAY, Disp: 180 tablet, Rfl: 1   lisinopril (ZESTRIL) 20 MG tablet, Take 10 mg by mouth daily., Disp: , Rfl:    metFORMIN (GLUCOPHAGE) 500 MG tablet, Take 500 mg by mouth 2 (two) times  daily., Disp: , Rfl:    methimazole (TAPAZOLE) 5 MG tablet, Take 5 mg by mouth daily., Disp: , Rfl:    multivitamin-iron-minerals-folic acid (CENTRUM) chewable tablet, Chew 1 tablet by mouth daily., Disp: , Rfl:    Nutritional Supplements (SALMON OIL) CAPS, See admin instructions., Disp: , Rfl:     Cardiovascular & other pertient studies:  EKG 02/23/2022: Probable sinus rhythm 67 bpm Right bundle branch block  Echocardiogram 08/17/2021: Left ventricle cavity is normal in size. Mild concentric hypertrophy of the left ventricle. Normal global wall motion. Normal LV systolic function with EF 61%. Doppler evidence of grade I (impaired) diastolic dysfunction, normal LAP.  Left atrial cavity is severely dilated. Structurally normal trileaflet aortic valve.  Mild (Grade I) aortic regurgitation. Structurally normal mitral valve.  Mild to moderate mitral regurgitation. Structurally normal tricuspid valve.  Mild tricuspid regurgitation. Estimated pulmonary artery systolic pressure 37 mmHg. Previous study on 08/04/2020 noted grade II diastolic dysfunction, mild RA enlargement, estimated PASP 44 mmHg.  CT chest 08/05/2019: 1. Lung-RADS 2S, benign appearance or behavior. Continue annual screening with low-dose chest CT without contrast in 12 months. 2. The "S" modifier above refers to potentially clinically significant non lung cancer related findings. Specifically, there is aortic atherosclerosis, in addition to left anterior descending coronary artery disease. Please note that although the presence of coronary artery calcium documents  the presence of coronary artery disease, the severity of this disease and any potential stenosis cannot be assessed on this non-gated CT examination. Assessment for potential risk factor modification, dietary therapy or pharmacologic therapy may be warranted, if clinically indicated. 3. Mild diffuse bronchial wall thickening with mild centrilobular and paraseptal  emphysema; imaging findings suggestive of underlying COPD.   Aortic Atherosclerosis (ICD10-I70.0) and Emphysema (ICD10-J43.9).  US thyroid 05/2020: 1. Thyromegaly with bilateral nodules. Recommend FNA biopsy of moderately suspicious 1.9 cm inferior left nodule. 2. Recommend annual/biennial ultrasound follow-up of additional nodules as above, until stability x5 years confirmed.  Recent labs: 02/05/2022: Glucose 90, BUN/Cr 26/2.15. EGFR 24. Na/K 137/3.8.  HbA1C 8.1% Tsat 10% low  01/15/2022: Glucose 110, BUN/Cr 28/2.36. EGFR 20. Na/K 139/4.4. Protein 5.2. Albumin 2.0.  01/09/2022: Glucose 198, BUN/Cr 25/2.11. EGFR 24. Na/K 136/4.3.  BNP 272  12/22/2021: Glucose 80, BUN/Cr 32/2.06. EGFR 25. Na/K 140/4.2.   05/20/2020: eGFR 57 Chol 191, TG 130, HDL 92, LDL 71  12/24/2019: BUN/Cr 21/1.6. eGFR 65. HbA1C 9.8%  04/13/2019: Glucose 190, BUN/Cr 14/0.9. EGFR 68. Na/K 139/4.3. Albumin 3.3, total protein 6.3. Rest of the CMP normal H/H 13/39. MCV 90. Platelets 213 HbA1C 9.7% Chol 195, TG 82, HDL 90, LDL 89 TSH 3.7 normal   Review of Systems  Constitutional: Positive for malaise/fatigue.  Cardiovascular:  Positive for leg swelling (Improving). Negative for chest pain, dyspnea on exertion, palpitations and syncope.         Vitals:   02/23/22 1019 02/23/22 1033  BP: (!) 154/66 (!) 188/81  Pulse: 67 67  Resp: 17   Temp: (!) 97.5 F (36.4 C)   SpO2: 98% 100%     Body mass index is 28.31 kg/m. Filed Weights   02/23/22 1019  Weight: 175 lb 6.4 oz (79.6 kg)     Objective:   Physical Exam Vitals and nursing note reviewed.  Constitutional:      General: She is not in acute distress. Neck:     Vascular: No JVD.  Cardiovascular:     Rate and Rhythm: Normal rate and regular rhythm.     Pulses: Intact distal pulses.     Heart sounds: Normal heart sounds. No murmur heard. Pulmonary:     Effort: Pulmonary effort is normal.     Breath sounds: Normal breath sounds. No wheezing  or rales.  Musculoskeletal:     Right lower leg: Edema (2+) present.     Left lower leg: Edema (2+) present.         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 15, improved from 2.38.  Continue f/u w/nephrology.  Coronary atherosclerosis: LAD calcification seen on lung cancer CT chest. No angina symptoms. Not on aspirin due to ongoing use of eliquis. Continue metoprolol. Lipids well controlled. Strongly encourage smoking cessation.  Paroxysmal atrial fibrillation: In sinus rhythm today. Continue anticoagulation with Eliquis 5 mg twice daily for CHADSVASc Score of 5.  See below regarding beta-blocker.  Hypertension: Uncontrolled. Increased labetalol to 200 mg bid.  Hyperthyroidism: Continue f/u w/Endorcinology.    MR, TR, pulmonary hypertension: Likely driven by hypertnesion, as well as paroxysmal Afib. 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  Type II diabetes mellitus: Well controlled.  Consider stopping metfomin in light if CKD. Discuss with endocrinology.  F/u in 6 weeks  Justinn Welter Esther Hardy, MD [facility] Pager:  [phone] Office: [phone]

## 2022-02-23 ENCOUNTER — Ambulatory Visit: Payer: Medicare Other | Admitting: Cardiology

## 2022-02-23 ENCOUNTER — Other Ambulatory Visit: Payer: Self-pay | Admitting: Cardiology

## 2022-02-23 ENCOUNTER — Encounter: Payer: Self-pay | Admitting: Cardiology

## 2022-02-23 VITALS — BP 188/81 | HR 67 | Temp 97.5°F | Resp 17 | Ht 66.0 in | Wt 175.4 lb

## 2022-02-23 DIAGNOSIS — N1832 Chronic kidney disease, stage 3b: Secondary | ICD-10-CM | POA: Diagnosis not present

## 2022-02-23 DIAGNOSIS — I10 Essential (primary) hypertension: Secondary | ICD-10-CM | POA: Diagnosis not present

## 2022-02-23 DIAGNOSIS — I480 Paroxysmal atrial fibrillation: Secondary | ICD-10-CM

## 2022-02-23 DIAGNOSIS — N179 Acute kidney failure, unspecified: Secondary | ICD-10-CM | POA: Diagnosis not present

## 2022-02-23 MED ORDER — LABETALOL HCL 200 MG PO TABS: 200.0000 mg | ORAL_TABLET | Freq: Two times a day (BID) | ORAL | 3 refills | Status: DC

## 2022-03-01 DIAGNOSIS — E785 Hyperlipidemia, unspecified: Secondary | ICD-10-CM | POA: Diagnosis not present

## 2022-03-01 DIAGNOSIS — E0590 Thyrotoxicosis, unspecified without thyrotoxic crisis or storm: Secondary | ICD-10-CM | POA: Diagnosis not present

## 2022-03-01 DIAGNOSIS — E139 Other specified diabetes mellitus without complications: Secondary | ICD-10-CM | POA: Diagnosis not present

## 2022-03-01 DIAGNOSIS — N184 Chronic kidney disease, stage 4 (severe): Secondary | ICD-10-CM | POA: Diagnosis not present

## 2022-03-01 DIAGNOSIS — R809 Proteinuria, unspecified: Secondary | ICD-10-CM | POA: Diagnosis not present

## 2022-03-01 DIAGNOSIS — E118 Type 2 diabetes mellitus with unspecified complications: Secondary | ICD-10-CM | POA: Diagnosis not present

## 2022-03-03 ENCOUNTER — Other Ambulatory Visit: Payer: Self-pay | Admitting: Cardiology

## 2022-03-03 DIAGNOSIS — I10 Essential (primary) hypertension: Secondary | ICD-10-CM

## 2022-03-05 ENCOUNTER — Other Ambulatory Visit: Payer: Self-pay

## 2022-03-05 DIAGNOSIS — I10 Essential (primary) hypertension: Secondary | ICD-10-CM

## 2022-03-05 MED ORDER — LABETALOL HCL 200 MG PO TABS: 200.0000 mg | ORAL_TABLET | Freq: Two times a day (BID) | ORAL | 3 refills | Status: DC

## 2022-03-06 ENCOUNTER — Other Ambulatory Visit: Payer: Self-pay

## 2022-03-06 ENCOUNTER — Telehealth: Payer: Self-pay | Admitting: Cardiology

## 2022-03-06 DIAGNOSIS — I10 Essential (primary) hypertension: Secondary | ICD-10-CM

## 2022-03-06 MED ORDER — LABETALOL HCL 200 MG PO TABS: 200.0000 mg | ORAL_TABLET | Freq: Two times a day (BID) | ORAL | 3 refills | Status: DC

## 2022-03-06 NOTE — Telephone Encounter (Signed)
Pt is calling requesting a medication refill on below medication   labetalol (NORMODYNE) 200 MG tablet  [facility] [address] [place]

## 2022-03-06 NOTE — Telephone Encounter (Signed)
RX has been sent.

## 2022-03-12 DIAGNOSIS — R4020 Unspecified coma: Secondary | ICD-10-CM | POA: Diagnosis not present

## 2022-03-12 DIAGNOSIS — R61 Generalized hyperhidrosis: Secondary | ICD-10-CM | POA: Diagnosis not present

## 2022-03-12 DIAGNOSIS — E162 Hypoglycemia, unspecified: Secondary | ICD-10-CM | POA: Diagnosis not present

## 2022-03-12 DIAGNOSIS — R450 Nervousness: Secondary | ICD-10-CM | POA: Diagnosis not present

## 2022-03-12 DIAGNOSIS — E161 Other hypoglycemia: Secondary | ICD-10-CM | POA: Diagnosis not present

## 2022-03-13 DIAGNOSIS — E139 Other specified diabetes mellitus without complications: Secondary | ICD-10-CM | POA: Diagnosis not present

## 2022-03-13 DIAGNOSIS — E0590 Thyrotoxicosis, unspecified without thyrotoxic crisis or storm: Secondary | ICD-10-CM | POA: Diagnosis not present

## 2022-03-13 DIAGNOSIS — I272 Pulmonary hypertension, unspecified: Secondary | ICD-10-CM | POA: Diagnosis not present

## 2022-03-13 DIAGNOSIS — R413 Other amnesia: Secondary | ICD-10-CM | POA: Diagnosis not present

## 2022-03-13 DIAGNOSIS — R809 Proteinuria, unspecified: Secondary | ICD-10-CM | POA: Diagnosis not present

## 2022-03-13 DIAGNOSIS — E1122 Type 2 diabetes mellitus with diabetic chronic kidney disease: Secondary | ICD-10-CM | POA: Diagnosis not present

## 2022-03-13 DIAGNOSIS — Z720 Tobacco use: Secondary | ICD-10-CM | POA: Diagnosis not present

## 2022-03-13 DIAGNOSIS — D631 Anemia in chronic kidney disease: Secondary | ICD-10-CM | POA: Diagnosis not present

## 2022-03-13 DIAGNOSIS — N184 Chronic kidney disease, stage 4 (severe): Secondary | ICD-10-CM | POA: Diagnosis not present

## 2022-03-13 DIAGNOSIS — E785 Hyperlipidemia, unspecified: Secondary | ICD-10-CM | POA: Diagnosis not present

## 2022-03-13 DIAGNOSIS — F339 Major depressive disorder, recurrent, unspecified: Secondary | ICD-10-CM | POA: Diagnosis not present

## 2022-03-13 DIAGNOSIS — I129 Hypertensive chronic kidney disease with stage 1 through stage 4 chronic kidney disease, or unspecified chronic kidney disease: Secondary | ICD-10-CM | POA: Diagnosis not present

## 2022-03-15 DIAGNOSIS — E0590 Thyrotoxicosis, unspecified without thyrotoxic crisis or storm: Secondary | ICD-10-CM | POA: Diagnosis not present

## 2022-03-15 DIAGNOSIS — I10 Essential (primary) hypertension: Secondary | ICD-10-CM | POA: Diagnosis not present

## 2022-03-15 DIAGNOSIS — R809 Proteinuria, unspecified: Secondary | ICD-10-CM | POA: Diagnosis not present

## 2022-03-15 DIAGNOSIS — E785 Hyperlipidemia, unspecified: Secondary | ICD-10-CM | POA: Diagnosis not present

## 2022-03-15 DIAGNOSIS — I480 Paroxysmal atrial fibrillation: Secondary | ICD-10-CM | POA: Diagnosis not present

## 2022-03-15 DIAGNOSIS — E139 Other specified diabetes mellitus without complications: Secondary | ICD-10-CM | POA: Diagnosis not present

## 2022-03-15 DIAGNOSIS — R413 Other amnesia: Secondary | ICD-10-CM | POA: Diagnosis not present

## 2022-03-23 DIAGNOSIS — I10 Essential (primary) hypertension: Secondary | ICD-10-CM | POA: Diagnosis not present

## 2022-03-27 ENCOUNTER — Other Ambulatory Visit: Payer: Self-pay | Admitting: Cardiology

## 2022-03-27 DIAGNOSIS — I10 Essential (primary) hypertension: Secondary | ICD-10-CM

## 2022-03-28 ENCOUNTER — Other Ambulatory Visit: Payer: Self-pay | Admitting: Cardiology

## 2022-03-28 DIAGNOSIS — I10 Essential (primary) hypertension: Secondary | ICD-10-CM

## 2022-03-29 DIAGNOSIS — E0590 Thyrotoxicosis, unspecified without thyrotoxic crisis or storm: Secondary | ICD-10-CM | POA: Diagnosis not present

## 2022-03-29 DIAGNOSIS — N184 Chronic kidney disease, stage 4 (severe): Secondary | ICD-10-CM | POA: Diagnosis not present

## 2022-03-29 DIAGNOSIS — E139 Other specified diabetes mellitus without complications: Secondary | ICD-10-CM | POA: Diagnosis not present

## 2022-04-11 DIAGNOSIS — R413 Other amnesia: Secondary | ICD-10-CM | POA: Diagnosis not present

## 2022-04-12 DIAGNOSIS — E0590 Thyrotoxicosis, unspecified without thyrotoxic crisis or storm: Secondary | ICD-10-CM | POA: Diagnosis not present

## 2022-04-12 DIAGNOSIS — R413 Other amnesia: Secondary | ICD-10-CM | POA: Diagnosis not present

## 2022-04-12 DIAGNOSIS — E139 Other specified diabetes mellitus without complications: Secondary | ICD-10-CM | POA: Diagnosis not present

## 2022-04-13 ENCOUNTER — Other Ambulatory Visit (HOSPITAL_COMMUNITY): Payer: Self-pay | Admitting: *Deleted

## 2022-04-13 DIAGNOSIS — R413 Other amnesia: Secondary | ICD-10-CM | POA: Diagnosis not present

## 2022-04-16 ENCOUNTER — Encounter: Payer: Self-pay | Admitting: Cardiology

## 2022-04-16 ENCOUNTER — Ambulatory Visit (HOSPITAL_COMMUNITY)
Admission: RE | Admit: 2022-04-16 | Discharge: 2022-04-16 | Disposition: A | Discharge status: Home / Self Care | Payer: Medicare Other | Source: Ambulatory Visit | Attending: Nephrology | Admitting: Nephrology

## 2022-04-16 ENCOUNTER — Ambulatory Visit: Payer: Medicare Other | Admitting: Cardiology

## 2022-04-16 ENCOUNTER — Encounter (HOSPITAL_COMMUNITY): Payer: Medicare Other | Admitting: Cardiology

## 2022-04-16 VITALS — BP 118/64 | HR 71 | Temp 97.9°F | Resp 16 | Ht 66.0 in | Wt 147.0 lb

## 2022-04-16 DIAGNOSIS — I10 Essential (primary) hypertension: Secondary | ICD-10-CM

## 2022-04-16 DIAGNOSIS — N184 Chronic kidney disease, stage 4 (severe): Secondary | ICD-10-CM | POA: Insufficient documentation

## 2022-04-16 DIAGNOSIS — D631 Anemia in chronic kidney disease: Secondary | ICD-10-CM | POA: Insufficient documentation

## 2022-04-16 DIAGNOSIS — I480 Paroxysmal atrial fibrillation: Secondary | ICD-10-CM | POA: Diagnosis not present

## 2022-04-16 MED ORDER — SODIUM CHLORIDE 0.9 % IV SOLN
510.0000 mg | INTRAVENOUS | Status: DC
  Administered 2022-04-16: 510 mg via INTRAVENOUS
  Filled 2022-04-16: qty 510

## 2022-04-16 NOTE — Progress Notes (Addendum)
Follow up visit  Subjective:   Carol Valenzuela, female    DOB: 06-01-1951, 71 y.o.   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 Breathing is better. She has lost 28 lbs since last visit. She is following nephrologist Dr. Moshe Cipro. Cr has gone up to 2.84. Patient is getting iron infusions at [facility].     Current Outpatient Medications:    apixaban (ELIQUIS) 5 MG TABS tablet, Take 5 mg by mouth 2 (two) times daily., Disp: , Rfl:    atorvastatin (LIPITOR) 40 MG tablet, TAKE 1 TABLET EVERY DAY, Disp: 90 tablet, Rfl: 2   buPROPion (WELLBUTRIN XL) 150 MG 24 hr tablet, Take 150 mg by mouth at bedtime., Disp: , Rfl:    calcium-vitamin D (OSCAL-500) 500-400 MG-UNIT tablet, Take 1 tablet by mouth daily., Disp: , Rfl:    furosemide (LASIX) 40 MG tablet, TAKE 1 TABLET BY MOUTH TWICE A DAY, Disp: 180 tablet, Rfl: 1   hydrALAZINE (APRESOLINE) 50 MG tablet, TAKE 1 TABLET BY MOUTH THREE TIMES A DAY, Disp: 270 tablet, Rfl: 1   insulin aspart (NOVOLOG) 100 UNIT/ML FlexPen, Inject into the skin 3 (three) times daily with meals., Disp: , Rfl:    insulin degludec (TRESIBA FLEXTOUCH) 100 UNIT/ML FlexTouch Pen, Inject into the skin daily. 14 units AM 7 units PM, Disp: , Rfl:    Insulin Degludec 100 UNIT/ML SOLN, Inject 12 Units into the skin daily., Disp: , Rfl:    isosorbide dinitrate (ISORDIL) 30 MG tablet, TAKE 1 TABLET BY MOUTH 3 TIMES DAILY., Disp: 90 tablet, Rfl: 1   lisinopril (ZESTRIL) 20 MG tablet, Take 10 mg by mouth daily., Disp: , Rfl:    methimazole (TAPAZOLE) 5 MG tablet, Take 5 mg by mouth daily., Disp: , Rfl:    multivitamin-iron-minerals-folic acid (CENTRUM) chewable tablet, Chew 1 tablet by  mouth daily., Disp: , Rfl:    Nutritional Supplements (SALMON OIL) CAPS, See admin instructions., Disp: , Rfl:  No current facility-administered medications for this visit.  Facility-Administered Medications Ordered in Other Visits:    ferumoxytol (FERAHEME) 510 mg in sodium chloride 0.9 % 100 mL IVPB, 510 mg, Intravenous, Weekly, Corliss Parish, MD, Stopped at 04/16/22 1138    Cardiovascular & other pertient studies:  EKG 02/23/2022: Probable sinus rhythm 67 bpm Right bundle branch block  Echocardiogram 08/17/2021: Left ventricle cavity is normal in size. Mild concentric hypertrophy of the left ventricle. Normal global wall motion. Normal LV systolic function with EF 61%. Doppler evidence of grade I (impaired) diastolic dysfunction, normal LAP.  Left atrial cavity is severely dilated. Structurally normal trileaflet aortic valve.  Mild (Grade I) aortic regurgitation. Structurally normal mitral valve.  Mild to moderate mitral regurgitation. Structurally normal tricuspid valve.  Mild tricuspid regurgitation. Estimated pulmonary artery systolic pressure 37 mmHg. Previous study on 08/04/2020 noted grade II diastolic dysfunction, mild RA enlargement, estimated PASP 44 mmHg.  CT chest 08/05/2019: 1. Lung-RADS 2S, benign appearance or behavior. Continue annual screening with low-dose chest CT without contrast in 12 months. 2. The "S" modifier above refers to potentially clinically significant non lung cancer related findings. Specifically, there is aortic atherosclerosis, in addition to left anterior descending coronary artery  disease. Please note that although the presence of coronary artery calcium documents the presence of coronary artery disease, the severity of this disease and any potential stenosis cannot be assessed on this non-gated CT examination. Assessment for potential risk factor modification, dietary therapy or pharmacologic therapy may be warranted, if clinically  indicated. 3. Mild diffuse bronchial wall thickening with mild centrilobular and paraseptal emphysema; imaging findings suggestive of underlying COPD.   Aortic Atherosclerosis (ICD10-I70.0) and Emphysema (ICD10-J43.9).  US thyroid 05/2020: 1. Thyromegaly with bilateral nodules. Recommend FNA biopsy of moderately suspicious 1.9 cm inferior left nodule. 2. Recommend annual/biennial ultrasound follow-up of additional nodules as above, until stability x5 years confirmed.  Recent labs: 03/01/2022: Glucose 238, BUN/Cr 37/2.8. EGFR 20.  HbA1C 7.3% Chol 213, TG 167, HDL 104, LDL 71 TSH 3.7 normal  02/05/2022: Glucose 90, BUN/Cr 26/2.15. EGFR 24. Na/K 137/3.8.  HbA1C 8.1% Tsat 10% low  Review of Systems  Constitutional: Positive for malaise/fatigue.  Cardiovascular:  Positive for leg swelling (Improving). Negative for chest pain, dyspnea on exertion, palpitations and syncope.         Vitals:   04/16/22 1331  BP: 118/64  Pulse: 71  Resp: 16  Temp: 97.9 F (36.6 C)  SpO2: 97%     Body mass index is 28.08 kg/m. Filed Weights   04/16/22 1331  Weight: 174 lb (78.9 kg)     Objective:   Physical Exam Vitals and nursing note reviewed.  Constitutional:      General: She is not in acute distress. Neck:     Vascular: No JVD.  Cardiovascular:     Rate and Rhythm: Normal rate and regular rhythm.     Pulses: Intact distal pulses.     Heart sounds: Murmur heard.     High-pitched blowing holosystolic murmur is present with a grade of 3/6 at the apex.  Pulmonary:     Effort: Pulmonary effort is normal.     Breath sounds: Normal breath sounds. No wheezing or rales.  Musculoskeletal:     Right lower leg: No edema.     Left lower leg: No edema.         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 No angina symptoms. Not on aspirin due to ongoing use of eliquis. Continue metoprolol. Lipids well controlled. Strongly encourage smoking cessation.  Paroxysmal atrial fibrillation: In sinus rhythm today. Continue anticoagulation with Eliquis 5 mg twice daily for CHADSVASc Score of 5.  See below regarding beta-blocker.  Hypertension: Controlled. Labetalol was stopped previously by Dr. Moshe Cipro as BP was running lower.  Hyperthyroidism: Continue f/u w/Endorcinology.    MR, TR, pulmonary hypertension: Likely driven by hypertnesion, as well as paroxysmal Afib. 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 Will check echocardiogram.  Type I diabetes mellitus: Fairly well controlled.  Consider stopping metfomin in light if CKD. Discuss with endocrinology.  F/u in 3 months  [place], MD [facility]. [facility] Pager: [phone] Office: [phone]

## 2022-04-18 DIAGNOSIS — E118 Type 2 diabetes mellitus with unspecified complications: Secondary | ICD-10-CM | POA: Diagnosis not present

## 2022-04-18 DIAGNOSIS — E162 Hypoglycemia, unspecified: Secondary | ICD-10-CM | POA: Diagnosis not present

## 2022-04-20 ENCOUNTER — Telehealth: Payer: Self-pay | Admitting: Cardiology

## 2022-04-20 NOTE — Telephone Encounter (Signed)
Done

## 2022-04-20 NOTE — Telephone Encounter (Signed)
Patent's sister calling to review medication list. She says this is the second time she has called and hasn't heard back yet. Call her at number provided.

## 2022-04-20 NOTE — Telephone Encounter (Signed)
Pt daughter called due to confusion of a medication dose. Pt was taking Hydralazine 100mg  in May, then pt medication was changed to 50mg  on June pt daughter mention pt wasn't started on the 43 yet. Per Dr. Virgina Jock pt can start on 50mg  instead of 100mg  and to continue checking her bp

## 2022-04-23 DIAGNOSIS — I10 Essential (primary) hypertension: Secondary | ICD-10-CM | POA: Diagnosis not present

## 2022-04-24 ENCOUNTER — Encounter (HOSPITAL_COMMUNITY): Payer: Self-pay | Admitting: Cardiology

## 2022-04-24 ENCOUNTER — Ambulatory Visit (HOSPITAL_BASED_OUTPATIENT_CLINIC_OR_DEPARTMENT_OTHER)
Admission: RE | Admit: 2022-04-24 | Discharge: 2022-04-24 | Disposition: A | Discharge status: Home / Self Care | Payer: Medicare Other | Source: Ambulatory Visit | Attending: Cardiology | Admitting: Cardiology

## 2022-04-24 ENCOUNTER — Encounter (HOSPITAL_COMMUNITY)
Admission: RE | Admit: 2022-04-24 | Discharge: 2022-04-24 | Disposition: A | Discharge status: Home / Self Care | Payer: Medicare Other | Source: Ambulatory Visit | Attending: Nephrology | Admitting: Nephrology

## 2022-04-24 ENCOUNTER — Other Ambulatory Visit (HOSPITAL_COMMUNITY): Payer: Self-pay

## 2022-04-24 VITALS — BP 150/80 | HR 66 | Wt 144.0 lb

## 2022-04-24 DIAGNOSIS — N183 Chronic kidney disease, stage 3 unspecified: Secondary | ICD-10-CM | POA: Insufficient documentation

## 2022-04-24 DIAGNOSIS — R0602 Shortness of breath: Secondary | ICD-10-CM

## 2022-04-24 DIAGNOSIS — F17210 Nicotine dependence, cigarettes, uncomplicated: Secondary | ICD-10-CM | POA: Insufficient documentation

## 2022-04-24 DIAGNOSIS — I130 Hypertensive heart and chronic kidney disease with heart failure and stage 1 through stage 4 chronic kidney disease, or unspecified chronic kidney disease: Secondary | ICD-10-CM | POA: Diagnosis not present

## 2022-04-24 DIAGNOSIS — I480 Paroxysmal atrial fibrillation: Secondary | ICD-10-CM | POA: Insufficient documentation

## 2022-04-24 DIAGNOSIS — J439 Emphysema, unspecified: Secondary | ICD-10-CM | POA: Insufficient documentation

## 2022-04-24 DIAGNOSIS — I10 Essential (primary) hypertension: Secondary | ICD-10-CM

## 2022-04-24 DIAGNOSIS — E1122 Type 2 diabetes mellitus with diabetic chronic kidney disease: Secondary | ICD-10-CM | POA: Diagnosis not present

## 2022-04-24 DIAGNOSIS — D631 Anemia in chronic kidney disease: Secondary | ICD-10-CM | POA: Diagnosis not present

## 2022-04-24 DIAGNOSIS — I272 Pulmonary hypertension, unspecified: Secondary | ICD-10-CM

## 2022-04-24 DIAGNOSIS — R011 Cardiac murmur, unspecified: Secondary | ICD-10-CM

## 2022-04-24 DIAGNOSIS — Z720 Tobacco use: Secondary | ICD-10-CM | POA: Diagnosis not present

## 2022-04-24 DIAGNOSIS — I5032 Chronic diastolic (congestive) heart failure: Secondary | ICD-10-CM | POA: Diagnosis not present

## 2022-04-24 DIAGNOSIS — I2510 Atherosclerotic heart disease of native coronary artery without angina pectoris: Secondary | ICD-10-CM | POA: Insufficient documentation

## 2022-04-24 DIAGNOSIS — N184 Chronic kidney disease, stage 4 (severe): Secondary | ICD-10-CM | POA: Diagnosis not present

## 2022-04-24 DIAGNOSIS — Z7901 Long term (current) use of anticoagulants: Secondary | ICD-10-CM | POA: Insufficient documentation

## 2022-04-24 DIAGNOSIS — Z79899 Other long term (current) drug therapy: Secondary | ICD-10-CM | POA: Diagnosis not present

## 2022-04-24 DIAGNOSIS — E0590 Thyrotoxicosis, unspecified without thyrotoxic crisis or storm: Secondary | ICD-10-CM | POA: Diagnosis not present

## 2022-04-24 DIAGNOSIS — I129 Hypertensive chronic kidney disease with stage 1 through stage 4 chronic kidney disease, or unspecified chronic kidney disease: Secondary | ICD-10-CM | POA: Diagnosis not present

## 2022-04-24 LAB — ECHOCARDIOGRAM COMPLETE
Area-P 1/2: 3.31 cm2
P 1/2 time: 583 msec
S' Lateral: 3.3 cm
Weight: 2304 oz

## 2022-04-24 LAB — BASIC METABOLIC PANEL
Anion gap: 11 (ref 5–15)
BUN: 75 mg/dL — ABNORMAL HIGH (ref 8–23)
CO2: 25 mmol/L (ref 22–32)
Calcium: 9.4 mg/dL (ref 8.9–10.3)
Chloride: 101 mmol/L (ref 98–111)
Creatinine, Ser: 3.2 mg/dL — ABNORMAL HIGH (ref 0.44–1.00)
GFR, Estimated: 15 mL/min — ABNORMAL LOW (ref >60)
Glucose, Bld: 251 mg/dL — ABNORMAL HIGH (ref 70–99)
Potassium: 4.3 mmol/L (ref 3.5–5.1)
Sodium: 137 mmol/L (ref 135–145)

## 2022-04-24 LAB — BRAIN NATRIURETIC PEPTIDE: B Natriuretic Peptide: 114.8 pg/mL — ABNORMAL HIGH (ref 0.0–100.0)

## 2022-04-24 MED ORDER — SODIUM CHLORIDE 0.9 % IV SOLN
510.0000 mg | Freq: Once | INTRAVENOUS | Status: AC
  Administered 2022-04-24: 510 mg via INTRAVENOUS
  Filled 2022-04-24: qty 17

## 2022-04-24 NOTE — H&P (View-Only) (Signed)
PCP: Deland Pretty, MD Nephrology: Dr. Moshe Cipro HF Cardiology: Dr. Aundra Dubin  71 y.o. with history of HTN, type 2 diabetes, paroxysmal atrial fibrillation, hyperthyroidism, CKD stage 3, and diastolic CHF/pulmonary hypertension was referred by Dr. Moshe Cipro for evaluation of pulmonary hypertension. Patient has been on apixaban for PAF, currently in NSR and denies palpitations.  Last creatinine in 6/23 was 2.84, CKD suspected to be due to HTN and type 2 diabetes.  Several months ago, she noted the development of peripheral edema.  She was started on Lasix and this was increased to 40 mg bid.  She says that this has significantly improved her peripheral edema.  Echoes in 11/21 and 12/22 showed mildly elevated PA pressure with preserved LV systolic function.  She is an active smoker with evidence for COPD on CT chest.  She also was noted to have coronary artery calcification on CT chest.   Patient reports no chest pain or palpitations recently.  She still has mild ankle edema but it is improved from the past.  No dyspnea walking on flat ground.  She is mildly short of breath after walking up 1 flight stairs. No problems doing work around the house.  No orthopnea/PND.  No lightheadedness. She is on bupropion but still smoking 4-5 cigarettes/day.  She failed Chantix in the past.  Patient's health literacy is not great, she is confused about what medications she takes.   ECG (personally reviewed): NSR, RBBB, LPFB  Labs (6/23): K 4.4, creatinine 2.84, hgb 10  PMH: 1. HTN 2. Type 2 diabetes 3. Hyperthyroidism: On methimazole.  4. Atrial fibrillation: Paroxysmal.  5. CKD stage 3: Likely from DM2 and HTN.  6. CAD: Coronary calcification noted on CT chest.  7. Chronic diastolic CHF: Echo in 81/19 with normal EF, PASP 44 mmHg. Echo (12/22) with EF 61%, severe LAE, mild-moderate MR, mild TR, PASP 37 mmHg.  8. Fe deficiency anemia 9. 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   SH: Lives with sister in [place], [place] (near [place]).  Smokes 4-5 cigarettes/day.  Rare ETOH.  Retired, used to work in [employer].   Family History  Problem Relation Age of Onset   Brain cancer Father    Lung cancer Father    Liver cancer Father    Alcoholism Father    Breast cancer Neg Hx    ROS: All systems reviewed and negative except as per HPI.   Current Outpatient Medications  Medication Sig Dispense Refill   apixaban (ELIQUIS) 5 MG TABS tablet Take 5 mg by mouth 2 (two) times daily.     Ascorbic Acid (VITA-C PO) Take 1 tablet by mouth 3 (three) times a week.     atorvastatin (LIPITOR) 40 MG tablet TAKE 1 TABLET EVERY DAY 90 tablet 2   buPROPion (WELLBUTRIN XL) 150 MG 24 hr tablet Take 150 mg by mouth at bedtime.     calcium-vitamin D (OSCAL-500) 500-400 MG-UNIT tablet Take 1 tablet by mouth daily.     ferrous sulfate (FERROUSUL) 325 (65 FE) MG tablet Take 325 mg by mouth 3 (three) times a week.     furosemide (LASIX) 40 MG tablet TAKE 1 TABLET BY MOUTH TWICE A DAY 180 tablet 1   hydrALAZINE (APRESOLINE) 50 MG tablet TAKE 1 TABLET BY MOUTH THREE TIMES A DAY 270 tablet 1   insulin aspart (NOVOLOG) 100 UNIT/ML FlexPen Inject into the skin 3 (three) times daily with meals.     Insulin Degludec (TRESIBA [place]) Inject 14  Units into the skin daily.     isosorbide dinitrate (ISORDIL) 30 MG tablet TAKE 1 TABLET BY MOUTH 3 TIMES DAILY. 90 tablet 1   lisinopril (ZESTRIL) 40 MG tablet Take 20 mg by mouth daily.     methimazole (TAPAZOLE) 5 MG tablet Take 5 mg by mouth daily.     VITAMIN D PO Take 1 tablet by mouth daily in the afternoon.     No current facility-administered medications for this encounter.   BP (!) 150/80   Pulse 66   Wt 65.3 kg (144 lb)   LMP  (LMP Unknown)   SpO2 97%   BMI 23.24 kg/m  General: NAD Neck: No JVD, no thyromegaly or thyroid nodule.  Lungs: Clear to auscultation bilaterally with normal respiratory effort. CV: Nondisplaced PMI.   Heart regular S1/S2, no S3/S4, 1/6 SEM RUSB.  1+ ankle edema.  No carotid bruit.  Normal pedal pulses.  Abdomen: Soft, nontender, no hepatosplenomegaly, no distention.  Skin: Intact without lesions or rashes.  Neurologic: Alert and oriented x 3.  Psych: Normal affect. Extremities: No clubbing or cyanosis.  HEENT: Normal.   Assessment/Plan: 1. Chronic diastolic CHF/pulmonary hypertension: Most recent echo in 12/22 showed EF 61%, severe LAE, mild-moderate MR, mild TR, PASP 37 mmHg.  Prior echo in 11/21 showed PASP 44 mmHg.  She has evidence for COPD on chest CT which can contribute to PA pressure elevation.  She has no JVD on exam but still with mild ankle edema.  She is on Lasix 40 mg bid with last creatinine 2.84.  - I would not increase Lasix, may need to decrease if creatinine any higher on BMET today. Will also check BNP.  - I would like to do RHC to assess filling pressures and PA pressure.  I discussed risks/benefits with her and she agrees to procedure. Hold Eliquis the day before and day of procedure. 2. Atrial fibrillation: Paroxysmal. She is in NSR today and denies palpitations.  - Continue Eliquis.  3. HTN: BP high in the office today but she says SBP runs 110s-130s at home, she checks daily.  - Continue lisinopril for now, but if creatinine is any higher on BMET today would stop lisinopril and increase hydralazine.  - Continue hydralazine 50 tid.  4. CKD: Stage 3, followed by Dr. Moshe Cipro.  - BMET today.  - I suspect GFR is too low for SGLT2 inhibitor.   - Avoid NSAIDs.  5. Hyperthyroidism: She is on methimazole and followed by endocrinology.  6. COPD/smoking: CT chest with emphysema.   - I strongly recommended that she quit smoking.  She is on bupropion without much effect, she failed Chantix in the past.  7. CAD: Coronary calcification noted on CT chest.  No chest pain.  - Continue atorvastatin.   Loralie Champagne 04/24/2022

## 2022-04-24 NOTE — Patient Instructions (Signed)
There has been no changes to your medications.  Labs done today, your results will be available in MyChart, we will contact you for abnormal readings.  You are scheduled for a Cardiac Catheterization on Wednesday, August 16 with Dr. Loralie Champagne.  1. Please arrive at the Main Entrance A at [facility]: [place] [place], [place] [address] at 11:00 AM (This time is two hours before your procedure to ensure your preparation). Free valet parking service is available.   Special note: Every effort is made to have your procedure done on time. Please understand that emergencies sometimes delay scheduled procedures.  2. Diet: Do not eat solid foods after midnight.  You may have clear liquids until 5 AM upon the day of the procedure.  3. Labs: completed today  4. Medication instructions in preparation for your procedure:   Contrast Allergy: No  Stop taking Eliquis (Apixiban) on Tuesday, August 15.  Stop taking, Lasix (Furosemide)  Wednesday, August 16,  Only take 1/2 dose of insulin night before and none the morning of procedure   On the morning of your procedure, take  any morning medicines NOT listed above.  You may use sips of water.  5. Plan to go home the same day, you will only stay overnight if medically necessary. 6. You MUST have a responsible adult to drive you home. 7. An adult MUST be with you the first 24 hours after you arrive home. 8. Bring a current list of your medications, and the last time and date medication taken. 9. Bring ID and current insurance cards. 10.Please wear clothes that are easy to get on and off and wear slip-on shoes.  Your physician recommends that you schedule a follow-up appointment as scheduled   If you have any questions or concerns before your next appointment please send [place] a message through [place] or call our office at [phone].    TO LEAVE A MESSAGE FOR THE NURSE SELECT OPTION 2, PLEASE LEAVE A MESSAGE INCLUDING: YOUR  NAME DATE OF BIRTH CALL BACK NUMBER REASON FOR CALL**this is important as we prioritize the call backs  YOU WILL RECEIVE A CALL BACK THE SAME DAY AS LONG AS YOU CALL BEFORE 4:00 PM  At the [facility], you and your health needs are our priority. As part of our continuing mission to provide you with exceptional heart care, we have created designated Provider Care Teams. These Care Teams include your primary Cardiologist (physician) and Advanced Practice Providers (APPs- Physician Assistants and Nurse Practitioners) who all work together to provide you with the care you need, when you need it.   You may see any of the following providers on your designated Care Team at your next follow up: Dr Glori Bickers Dr Haynes Kerns, NP Lyda Jester, [place] [facility] [place], [place] Audry Riles, PharmD   Please be sure to bring in all your medications bottles to every appointment.

## 2022-04-24 NOTE — Progress Notes (Signed)
PCP: Deland Pretty, MD Nephrology: Dr. Moshe Cipro HF Cardiology: Dr. Aundra Dubin  71 y.o. with history of HTN, type 2 diabetes, paroxysmal atrial fibrillation, hyperthyroidism, CKD stage 3, and diastolic CHF/pulmonary hypertension was referred by Dr. Moshe Cipro for evaluation of pulmonary hypertension. Patient has been on apixaban for PAF, currently in NSR and denies palpitations.  Last creatinine in 6/23 was 2.84, CKD suspected to be due to HTN and type 2 diabetes.  Several months ago, she noted the development of peripheral edema.  She was started on Lasix and this was increased to 40 mg bid.  She says that this has significantly improved her peripheral edema.  Echoes in 11/21 and 12/22 showed mildly elevated PA pressure with preserved LV systolic function.  She is an active smoker with evidence for COPD on CT chest.  She also was noted to have coronary artery calcification on CT chest.   Patient reports no chest pain or palpitations recently.  She still has mild ankle edema but it is improved from the past.  No dyspnea walking on flat ground.  She is mildly short of breath after walking up 1 flight stairs. No problems doing work around the house.  No orthopnea/PND.  No lightheadedness. She is on bupropion but still smoking 4-5 cigarettes/day.  She failed Chantix in the past.  Patient's health literacy is not great, she is confused about what medications she takes.   ECG (personally reviewed): NSR, RBBB, LPFB  Labs (6/23): K 4.4, creatinine 2.84, hgb 10  PMH: 1. HTN 2. Type 2 diabetes 3. Hyperthyroidism: On methimazole.  4. Atrial fibrillation: Paroxysmal.  5. CKD stage 3: Likely from DM2 and HTN.  6. CAD: Coronary calcification noted on CT chest.  7. Chronic diastolic CHF: Echo in 18/84 with normal EF, PASP 44 mmHg. Echo (12/22) with EF 61%, severe LAE, mild-moderate MR, mild TR, PASP 37 mmHg.  8. Fe deficiency anemia 9. 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   SH: Lives with sister in [place], [place] (near [place]).  Smokes 4-5 cigarettes/day.  Rare ETOH.  Retired, used to work in [employer].   Family History  Problem Relation Age of Onset   Brain cancer Father    Lung cancer Father    Liver cancer Father    Alcoholism Father    Breast cancer Neg Hx    ROS: All systems reviewed and negative except as per HPI.   Current Outpatient Medications  Medication Sig Dispense Refill   apixaban (ELIQUIS) 5 MG TABS tablet Take 5 mg by mouth 2 (two) times daily.     Ascorbic Acid (VITA-C PO) Take 1 tablet by mouth 3 (three) times a week.     atorvastatin (LIPITOR) 40 MG tablet TAKE 1 TABLET EVERY DAY 90 tablet 2   buPROPion (WELLBUTRIN XL) 150 MG 24 hr tablet Take 150 mg by mouth at bedtime.     calcium-vitamin D (OSCAL-500) 500-400 MG-UNIT tablet Take 1 tablet by mouth daily.     ferrous sulfate (FERROUSUL) 325 (65 FE) MG tablet Take 325 mg by mouth 3 (three) times a week.     furosemide (LASIX) 40 MG tablet TAKE 1 TABLET BY MOUTH TWICE A DAY 180 tablet 1   hydrALAZINE (APRESOLINE) 50 MG tablet TAKE 1 TABLET BY MOUTH THREE TIMES A DAY 270 tablet 1   insulin aspart (NOVOLOG) 100 UNIT/ML FlexPen Inject into the skin 3 (three) times daily with meals.     Insulin Degludec (TRESIBA [place]) Inject 14  Units into the skin daily.     isosorbide dinitrate (ISORDIL) 30 MG tablet TAKE 1 TABLET BY MOUTH 3 TIMES DAILY. 90 tablet 1   lisinopril (ZESTRIL) 40 MG tablet Take 20 mg by mouth daily.     methimazole (TAPAZOLE) 5 MG tablet Take 5 mg by mouth daily.     VITAMIN D PO Take 1 tablet by mouth daily in the afternoon.     No current facility-administered medications for this encounter.   BP (!) 150/80   Pulse 66   Wt 65.3 kg (144 lb)   LMP  (LMP Unknown)   SpO2 97%   BMI 23.24 kg/m  General: NAD Neck: No JVD, no thyromegaly or thyroid nodule.  Lungs: Clear to auscultation bilaterally with normal respiratory effort. CV: Nondisplaced PMI.   Heart regular S1/S2, no S3/S4, 1/6 SEM RUSB.  1+ ankle edema.  No carotid bruit.  Normal pedal pulses.  Abdomen: Soft, nontender, no hepatosplenomegaly, no distention.  Skin: Intact without lesions or rashes.  Neurologic: Alert and oriented x 3.  Psych: Normal affect. Extremities: No clubbing or cyanosis.  HEENT: Normal.   Assessment/Plan: 1. Chronic diastolic CHF/pulmonary hypertension: Most recent echo in 12/22 showed EF 61%, severe LAE, mild-moderate MR, mild TR, PASP 37 mmHg.  Prior echo in 11/21 showed PASP 44 mmHg.  She has evidence for COPD on chest CT which can contribute to PA pressure elevation.  She has no JVD on exam but still with mild ankle edema.  She is on Lasix 40 mg bid with last creatinine 2.84.  - I would not increase Lasix, may need to decrease if creatinine any higher on BMET today. Will also check BNP.  - I would like to do RHC to assess filling pressures and PA pressure.  I discussed risks/benefits with her and she agrees to procedure. Hold Eliquis the day before and day of procedure. 2. Atrial fibrillation: Paroxysmal. She is in NSR today and denies palpitations.  - Continue Eliquis.  3. HTN: BP high in the office today but she says SBP runs 110s-130s at home, she checks daily.  - Continue lisinopril for now, but if creatinine is any higher on BMET today would stop lisinopril and increase hydralazine.  - Continue hydralazine 50 tid.  4. CKD: Stage 3, followed by Dr. Moshe Cipro.  - BMET today.  - I suspect GFR is too low for SGLT2 inhibitor.   - Avoid NSAIDs.  5. Hyperthyroidism: She is on methimazole and followed by endocrinology.  6. COPD/smoking: CT chest with emphysema.   - I strongly recommended that she quit smoking.  She is on bupropion without much effect, she failed Chantix in the past.  7. CAD: Coronary calcification noted on CT chest.  No chest pain.  - Continue atorvastatin.   Loralie Champagne 04/24/2022

## 2022-04-25 ENCOUNTER — Telehealth (HOSPITAL_COMMUNITY): Payer: Self-pay | Admitting: Cardiology

## 2022-04-25 ENCOUNTER — Other Ambulatory Visit: Payer: Self-pay | Admitting: Cardiology

## 2022-04-25 DIAGNOSIS — I10 Essential (primary) hypertension: Secondary | ICD-10-CM

## 2022-04-25 MED ORDER — HYDRALAZINE HCL 100 MG PO TABS: 100.0000 mg | ORAL_TABLET | Freq: Three times a day (TID) | ORAL | 3 refills | Status: DC

## 2022-04-25 MED ORDER — FUROSEMIDE 40 MG PO TABS: 40.0000 mg | ORAL_TABLET | Freq: Every day | ORAL | 1 refills | Status: DC

## 2022-04-25 NOTE — Telephone Encounter (Signed)
Patient called.  Patient aware. Pt will need repeat labs at cath 8/16

## 2022-04-25 NOTE — Addendum Note (Signed)
Addended by: Nelsie Domino, Sharlot Gowda on: 04/25/2022 02:25 PM   Modules accepted: Orders

## 2022-04-25 NOTE — Telephone Encounter (Signed)
-----   Message from Larey Dresser, MD sent at 04/24/2022  3:56 PM EDT ----- Stop lisinopril, increase hydralazine to 100 mg tid. Hold Lasix x 3 days then decrease to 40 mg daily. BMET next week.

## 2022-04-26 ENCOUNTER — Other Ambulatory Visit: Payer: Self-pay | Admitting: Cardiology

## 2022-04-26 DIAGNOSIS — I10 Essential (primary) hypertension: Secondary | ICD-10-CM

## 2022-05-02 ENCOUNTER — Ambulatory Visit (HOSPITAL_COMMUNITY)
Admission: RE | Admit: 2022-05-02 | Discharge: 2022-05-02 | Disposition: A | Discharge status: Home / Self Care | Payer: Medicare Other | Attending: Cardiology | Admitting: Cardiology

## 2022-05-02 ENCOUNTER — Other Ambulatory Visit: Payer: Self-pay

## 2022-05-02 ENCOUNTER — Encounter (HOSPITAL_COMMUNITY)
Admission: RE | Disposition: A | Discharge status: Home / Self Care | Payer: Self-pay | Source: Home / Self Care | Attending: Cardiology

## 2022-05-02 DIAGNOSIS — I272 Pulmonary hypertension, unspecified: Secondary | ICD-10-CM | POA: Insufficient documentation

## 2022-05-02 DIAGNOSIS — Z7901 Long term (current) use of anticoagulants: Secondary | ICD-10-CM | POA: Diagnosis not present

## 2022-05-02 DIAGNOSIS — I5032 Chronic diastolic (congestive) heart failure: Secondary | ICD-10-CM | POA: Diagnosis not present

## 2022-05-02 DIAGNOSIS — N183 Chronic kidney disease, stage 3 unspecified: Secondary | ICD-10-CM | POA: Diagnosis not present

## 2022-05-02 DIAGNOSIS — I480 Paroxysmal atrial fibrillation: Secondary | ICD-10-CM | POA: Diagnosis not present

## 2022-05-02 DIAGNOSIS — I509 Heart failure, unspecified: Secondary | ICD-10-CM | POA: Diagnosis not present

## 2022-05-02 DIAGNOSIS — I2510 Atherosclerotic heart disease of native coronary artery without angina pectoris: Secondary | ICD-10-CM | POA: Diagnosis not present

## 2022-05-02 DIAGNOSIS — E0590 Thyrotoxicosis, unspecified without thyrotoxic crisis or storm: Secondary | ICD-10-CM | POA: Diagnosis not present

## 2022-05-02 DIAGNOSIS — I130 Hypertensive heart and chronic kidney disease with heart failure and stage 1 through stage 4 chronic kidney disease, or unspecified chronic kidney disease: Secondary | ICD-10-CM | POA: Insufficient documentation

## 2022-05-02 DIAGNOSIS — E1122 Type 2 diabetes mellitus with diabetic chronic kidney disease: Secondary | ICD-10-CM | POA: Insufficient documentation

## 2022-05-02 DIAGNOSIS — J449 Chronic obstructive pulmonary disease, unspecified: Secondary | ICD-10-CM | POA: Insufficient documentation

## 2022-05-02 DIAGNOSIS — Z794 Long term (current) use of insulin: Secondary | ICD-10-CM | POA: Diagnosis not present

## 2022-05-02 DIAGNOSIS — F17210 Nicotine dependence, cigarettes, uncomplicated: Secondary | ICD-10-CM | POA: Diagnosis not present

## 2022-05-02 DIAGNOSIS — Z79899 Other long term (current) drug therapy: Secondary | ICD-10-CM | POA: Diagnosis not present

## 2022-05-02 DIAGNOSIS — I10 Essential (primary) hypertension: Secondary | ICD-10-CM

## 2022-05-02 HISTORY — PX: RIGHT HEART CATH: CATH118263

## 2022-05-02 LAB — CBC
HCT: 32.6 % — ABNORMAL LOW (ref 36.0–46.0)
Hemoglobin: 10.9 g/dL — ABNORMAL LOW (ref 12.0–15.0)
MCH: 30.4 pg (ref 26.0–34.0)
MCHC: 33.4 g/dL (ref 30.0–36.0)
MCV: 91.1 fL (ref 80.0–100.0)
Platelets: 250 K/uL (ref 150–400)
RBC: 3.58 MIL/uL — ABNORMAL LOW (ref 3.87–5.11)
RDW: 13.2 % (ref 11.5–15.5)
WBC: 5.9 K/uL (ref 4.0–10.5)
nRBC: 0 % (ref 0.0–0.2)

## 2022-05-02 LAB — POCT I-STAT EG7
Acid-Base Excess: 0 mmol/L (ref 0.0–2.0)
Acid-Base Excess: 1 mmol/L (ref 0.0–2.0)
Bicarbonate: 24.8 mmol/L (ref 20.0–28.0)
Bicarbonate: 26 mmol/L (ref 20.0–28.0)
Calcium, Ion: 1.27 mmol/L (ref 1.15–1.40)
Calcium, Ion: 1.28 mmol/L (ref 1.15–1.40)
HCT: 29 % — ABNORMAL LOW (ref 36.0–46.0)
HCT: 29 % — ABNORMAL LOW (ref 36.0–46.0)
Hemoglobin: 9.9 g/dL — ABNORMAL LOW (ref 12.0–15.0)
Hemoglobin: 9.9 g/dL — ABNORMAL LOW (ref 12.0–15.0)
O2 Saturation: 74 %
O2 Saturation: 76 %
Potassium: 3.9 mmol/L (ref 3.5–5.1)
Potassium: 3.9 mmol/L (ref 3.5–5.1)
Sodium: 138 mmol/L (ref 135–145)
Sodium: 138 mmol/L (ref 135–145)
TCO2: 26 mmol/L (ref 22–32)
TCO2: 27 mmol/L (ref 22–32)
pCO2, Ven: 40.4 mmHg — ABNORMAL LOW (ref 44–60)
pCO2, Ven: 42.8 mmHg — ABNORMAL LOW (ref 44–60)
pH, Ven: 7.392 (ref 7.25–7.43)
pH, Ven: 7.396 (ref 7.25–7.43)
pO2, Ven: 40 mmHg (ref 32–45)
pO2, Ven: 41 mmHg (ref 32–45)

## 2022-05-02 LAB — GLUCOSE, CAPILLARY
Glucose-Capillary: 301 mg/dL — ABNORMAL HIGH (ref 70–99)
Glucose-Capillary: 310 mg/dL — ABNORMAL HIGH (ref 70–99)
Glucose-Capillary: 180 mg/dL — ABNORMAL HIGH (ref 70–99)

## 2022-05-02 LAB — BASIC METABOLIC PANEL
Anion gap: 10 (ref 5–15)
BUN: 60 mg/dL — ABNORMAL HIGH (ref 8–23)
CO2: 23 mmol/L (ref 22–32)
Calcium: 9.3 mg/dL (ref 8.9–10.3)
Chloride: 102 mmol/L (ref 98–111)
Creatinine, Ser: 2.87 mg/dL — ABNORMAL HIGH (ref 0.44–1.00)
GFR, Estimated: 17 mL/min — ABNORMAL LOW (ref >60)
Glucose, Bld: 290 mg/dL — ABNORMAL HIGH (ref 70–99)
Potassium: 4.8 mmol/L (ref 3.5–5.1)
Sodium: 135 mmol/L (ref 135–145)

## 2022-05-02 SURGERY — RIGHT HEART CATH
Anesthesia: LOCAL

## 2022-05-02 MED ORDER — HYDRALAZINE HCL 20 MG/ML IJ SOLN: 10.0000 mg | INTRAMUSCULAR | Status: DC | PRN

## 2022-05-02 MED ORDER — SODIUM CHLORIDE 0.9% FLUSH: 3.0000 mL | Freq: Two times a day (BID) | INTRAVENOUS | Status: DC

## 2022-05-02 MED ORDER — INSULIN ASPART 100 UNIT/ML IJ SOLN
8.0000 [IU] | Freq: Once | INTRAMUSCULAR | Status: AC
Start: 2022-05-02 — End: 2022-05-02
  Administered 2022-05-02: 8 [IU] via SUBCUTANEOUS
  Filled 2022-05-02: qty 1

## 2022-05-02 MED ORDER — LABETALOL HCL 5 MG/ML IV SOLN: 10.0000 mg | INTRAVENOUS | Status: DC | PRN

## 2022-05-02 MED ORDER — SODIUM CHLORIDE 0.9 % IV SOLN: INTRAVENOUS | Status: DC

## 2022-05-02 MED ORDER — FUROSEMIDE 40 MG PO TABS: 20.0000 mg | ORAL_TABLET | Freq: Every day | ORAL | 1 refills | Status: DC

## 2022-05-02 MED ORDER — SODIUM CHLORIDE 0.9% FLUSH
3.0000 mL | Freq: Two times a day (BID) | INTRAVENOUS | Status: DC
Start: 2022-05-02 — End: 2022-05-02

## 2022-05-02 MED ORDER — ACETAMINOPHEN 325 MG PO TABS: 650.0000 mg | ORAL_TABLET | ORAL | Status: DC | PRN

## 2022-05-02 MED ORDER — LIDOCAINE HCL (PF) 1 % IJ SOLN
INTRAMUSCULAR | Status: DC | PRN
  Administered 2022-05-02: 2 mL

## 2022-05-02 MED ORDER — SODIUM CHLORIDE 0.9% FLUSH: 3.0000 mL | INTRAVENOUS | Status: DC | PRN

## 2022-05-02 MED ORDER — SODIUM CHLORIDE 0.9 % IV SOLN: 250.0000 mL | INTRAVENOUS | Status: DC | PRN

## 2022-05-02 MED ORDER — INSULIN ASPART 100 UNIT/ML IJ SOLN
8.0000 [IU] | Freq: Once | INTRAMUSCULAR | Status: DC
Start: 2022-05-02 — End: 2022-05-02

## 2022-05-02 MED ORDER — HEPARIN (PORCINE) IN NACL 1000-0.9 UT/500ML-% IV SOLN
INTRAVENOUS | Status: DC | PRN
  Administered 2022-05-02: 500 mL

## 2022-05-02 MED ORDER — LIDOCAINE HCL (PF) 1 % IJ SOLN
INTRAMUSCULAR | Status: AC
  Filled 2022-05-02: qty 30

## 2022-05-02 MED ORDER — ONDANSETRON HCL 4 MG/2ML IJ SOLN: 4.0000 mg | Freq: Four times a day (QID) | INTRAMUSCULAR | Status: DC | PRN

## 2022-05-02 MED ORDER — HEPARIN (PORCINE) IN NACL 1000-0.9 UT/500ML-% IV SOLN
INTRAVENOUS | Status: AC
  Filled 2022-05-02: qty 500

## 2022-05-02 SURGICAL SUPPLY — 6 items
CATH BALLN WEDGE 5F 110CM (CATHETERS) ×1 IMPLANT
GUIDEWIRE .025 260CM (WIRE) ×1 IMPLANT
KIT HEART LEFT (KITS) ×2 IMPLANT
PACK CARDIAC CATHETERIZATION (CUSTOM PROCEDURE TRAY) ×2 IMPLANT
SHEATH GLIDE SLENDER 4/5FR (SHEATH) ×1 IMPLANT
TRANSDUCER W/STOPCOCK (MISCELLANEOUS) ×2 IMPLANT

## 2022-05-02 NOTE — Progress Notes (Signed)
Inpatient Diabetes Program Recommendations  AACE/ADA: New Consensus Statement on Inpatient Glycemic Control (2015)  Target Ranges:  Prepandial:   less than 140 mg/dL      Peak postprandial:   less than 180 mg/dL (1-2 hours)      Critically ill patients:  140 - 180 mg/dL   Lab Results  Component Value Date   GLUCAP 301 (H) 05/02/2022     Latest Reference Range & Units 05/02/22 11:15  Glucose-Capillary 70 - 99 mg/dL 301 (H)    Diabetes history: DM 1( LADA) Outpatient Diabetes medications:  Novolog 1-4 units tid with meals Tresiba 14 units daily Current orders for Inpatient glycemic control:  Novolog 8 units x1  Inpatient Diabetes Program Recommendations:    Monitor patient closely.  It appears that she is sensitive to insulin doses based on home insulin doses and diagnosis of LADA (DM1).    Thanks,  Adah Perl, RN, BC-ADM Inpatient Diabetes Coordinator Pager [phone]  (8a-5p)

## 2022-05-02 NOTE — Interval H&P Note (Signed)
History and Physical Interval Note:  05/02/2022 1:30 PM  Carol Valenzuela  has presented today for surgery, with the diagnosis of pulmonary htn.  The various methods of treatment have been discussed with the patient and family. After consideration of risks, benefits and other options for treatment, the patient has consented to  Procedure(s): RIGHT HEART CATH (N/A) as a surgical intervention.  The patient's history has been reviewed, patient examined, no change in status, stable for surgery.  I have reviewed the patient's chart and labs.  Questions were answered to the patient's satisfaction.     Carol Valenzuela

## 2022-05-02 NOTE — Progress Notes (Signed)
CRITICAL RESULT PROVIDER NOTIFICATION  Test performed and critical result: CBG 310  Date and time result received: 05/02/22 1215  Provider name/title: Aundra Dubin, MD   Date and time provider notified: 05/02/22 1220  Date and time provider responded: 05/02/22 1230  Provider response:See new orders Insulin (Novolog) 8 units.

## 2022-05-02 NOTE — Progress Notes (Signed)
CRITICAL RESULT PROVIDER NOTIFICATION  Test performed and critical result:  CBG: 301  Date and time result received: 05/02/22 1130  Provider name/title: Aundra Dubin, MD   Date and time provider notified: 05/02/22 1140  Date and time provider responded: 05/02/22 1140  Provider response:See new orders. Insulin (Novolog) 8 units.

## 2022-05-02 NOTE — Discharge Instructions (Addendum)
Decrease Lasix to 20 mg daily

## 2022-05-02 NOTE — Inpatient Diabetes Management (Signed)
Inpatient Diabetes Program Recommendations  AACE/ADA: New Consensus Statement on Inpatient Glycemic Control (2015)  Target Ranges:  Prepandial:   less than 140 mg/dL      Peak postprandial:   less than 180 mg/dL (1-2 hours)      Critically ill patients:  140 - 180 mg/dL   Lab Results  Component Value Date   GLUCAP 310 (H) 05/02/2022     Inpatient Diabetes Program Recommendations:    Note glucose at 12:15 is 310 mg/dL- Do not recommend more Novolog at this time since previous Novolog was administered at 11:54 AM.  Called and discussed with Dr. Aundra Dubin.  Per RN, patient did take her Tyler Aas yesterday.   Orders received to NOT GIVE second dose of Novolog.   Thanks, Adah Perl, RN, BC-ADM Inpatient Diabetes Coordinator Pager [phone]  (8a-5p)

## 2022-05-03 ENCOUNTER — Encounter (HOSPITAL_COMMUNITY): Payer: Self-pay | Admitting: Cardiology

## 2022-05-14 DIAGNOSIS — B351 Tinea unguium: Secondary | ICD-10-CM | POA: Diagnosis not present

## 2022-05-14 DIAGNOSIS — E1142 Type 2 diabetes mellitus with diabetic polyneuropathy: Secondary | ICD-10-CM | POA: Diagnosis not present

## 2022-05-14 DIAGNOSIS — M79676 Pain in unspecified toe(s): Secondary | ICD-10-CM | POA: Diagnosis not present

## 2022-05-14 DIAGNOSIS — L84 Corns and callosities: Secondary | ICD-10-CM | POA: Diagnosis not present

## 2022-05-17 ENCOUNTER — Encounter (HOSPITAL_COMMUNITY): Payer: Medicare Other

## 2022-05-17 DIAGNOSIS — F339 Major depressive disorder, recurrent, unspecified: Secondary | ICD-10-CM | POA: Diagnosis not present

## 2022-05-17 DIAGNOSIS — E118 Type 2 diabetes mellitus with unspecified complications: Secondary | ICD-10-CM | POA: Diagnosis not present

## 2022-05-17 DIAGNOSIS — E785 Hyperlipidemia, unspecified: Secondary | ICD-10-CM | POA: Diagnosis not present

## 2022-05-17 DIAGNOSIS — I10 Essential (primary) hypertension: Secondary | ICD-10-CM | POA: Diagnosis not present

## 2022-05-22 DIAGNOSIS — R413 Other amnesia: Secondary | ICD-10-CM | POA: Diagnosis not present

## 2022-05-23 DIAGNOSIS — I10 Essential (primary) hypertension: Secondary | ICD-10-CM | POA: Diagnosis not present

## 2022-05-23 DIAGNOSIS — R413 Other amnesia: Secondary | ICD-10-CM | POA: Diagnosis not present

## 2022-05-24 DIAGNOSIS — R413 Other amnesia: Secondary | ICD-10-CM | POA: Diagnosis not present

## 2022-05-29 DIAGNOSIS — N184 Chronic kidney disease, stage 4 (severe): Secondary | ICD-10-CM | POA: Diagnosis not present

## 2022-05-29 DIAGNOSIS — E1122 Type 2 diabetes mellitus with diabetic chronic kidney disease: Secondary | ICD-10-CM | POA: Diagnosis not present

## 2022-05-29 DIAGNOSIS — Z720 Tobacco use: Secondary | ICD-10-CM | POA: Diagnosis not present

## 2022-05-29 DIAGNOSIS — D631 Anemia in chronic kidney disease: Secondary | ICD-10-CM | POA: Diagnosis not present

## 2022-05-29 DIAGNOSIS — I272 Pulmonary hypertension, unspecified: Secondary | ICD-10-CM | POA: Diagnosis not present

## 2022-05-29 DIAGNOSIS — I129 Hypertensive chronic kidney disease with stage 1 through stage 4 chronic kidney disease, or unspecified chronic kidney disease: Secondary | ICD-10-CM | POA: Diagnosis not present

## 2022-06-01 DIAGNOSIS — I10 Essential (primary) hypertension: Secondary | ICD-10-CM | POA: Diagnosis not present

## 2022-06-01 DIAGNOSIS — E785 Hyperlipidemia, unspecified: Secondary | ICD-10-CM | POA: Diagnosis not present

## 2022-06-01 DIAGNOSIS — I119 Hypertensive heart disease without heart failure: Secondary | ICD-10-CM | POA: Diagnosis not present

## 2022-06-01 DIAGNOSIS — E108 Type 1 diabetes mellitus with unspecified complications: Secondary | ICD-10-CM | POA: Diagnosis not present

## 2022-06-04 ENCOUNTER — Telehealth: Payer: Self-pay

## 2022-06-04 NOTE — Telephone Encounter (Signed)
Spoke to patient's daughter/caregiver Maxine. She states patient mentioned feeling fine today. No signs or symptoms of elevated BP to report. Patient had another Dr. appointment recently and BP was elevated during appointment and Dr. changed isosorbide dinitrate 30 mg to TID instead of BID. Maxine feels medication change doesn't seem to be helping much. BP readings from today were before meds. Patient eating breakfast + taking medications and will take BP 1 hour later.   Most Recent BP Readings: 06/04/2022 Monday at 09:09 AM 130 / 66      06/04/2022 Monday at 07:22 AM 177 / 95      06/04/2022 Monday at 07:18 AM 192 / 101      06/03/2022 Sunday at 07:32 AM 159 / 82      06/03/2022 Sunday at 07:31 AM 163 / 84      06/02/2022 Saturday at 07:33 AM 158 / 89      06/02/2022 Saturday at 07:31 AM 160 / 90      06/02/2022 Saturday at 07:29 AM 163 / 90      06/02/2022 Saturday at 07:28 AM 161 / 89      06/01/2022 Friday at 07:24 AM 164 / 88      06/01/2022 Friday at 07:18 AM 164 / 90      05/31/2022 Thursday at 07:31 AM 160 / 83      05/30/2022 Wednesday at 07:26 AM 168 / 90      05/30/2022 Wednesday at 07:23 AM 171 / 91      05/29/2022 Tuesday at 07:53 AM 161 / 79      09 /07/2022 Monday at 07:23 AM 164 / 89

## 2022-06-06 ENCOUNTER — Ambulatory Visit (HOSPITAL_COMMUNITY)
Admission: RE | Admit: 2022-06-06 | Discharge: 2022-06-06 | Disposition: A | Discharge status: Home / Self Care | Payer: Medicare Other | Source: Ambulatory Visit | Attending: Family Medicine | Admitting: Family Medicine

## 2022-06-06 ENCOUNTER — Encounter (HOSPITAL_COMMUNITY): Payer: Self-pay

## 2022-06-06 VITALS — BP 166/70 | HR 67 | Wt 147.6 lb

## 2022-06-06 DIAGNOSIS — I272 Pulmonary hypertension, unspecified: Secondary | ICD-10-CM | POA: Diagnosis not present

## 2022-06-06 DIAGNOSIS — I5032 Chronic diastolic (congestive) heart failure: Secondary | ICD-10-CM

## 2022-06-06 DIAGNOSIS — R413 Other amnesia: Secondary | ICD-10-CM | POA: Diagnosis not present

## 2022-06-06 DIAGNOSIS — J439 Emphysema, unspecified: Secondary | ICD-10-CM | POA: Diagnosis not present

## 2022-06-06 DIAGNOSIS — E1122 Type 2 diabetes mellitus with diabetic chronic kidney disease: Secondary | ICD-10-CM | POA: Diagnosis not present

## 2022-06-06 DIAGNOSIS — E0590 Thyrotoxicosis, unspecified without thyrotoxic crisis or storm: Secondary | ICD-10-CM | POA: Insufficient documentation

## 2022-06-06 DIAGNOSIS — F17200 Nicotine dependence, unspecified, uncomplicated: Secondary | ICD-10-CM

## 2022-06-06 DIAGNOSIS — F339 Major depressive disorder, recurrent, unspecified: Secondary | ICD-10-CM | POA: Diagnosis not present

## 2022-06-06 DIAGNOSIS — J432 Centrilobular emphysema: Secondary | ICD-10-CM | POA: Diagnosis not present

## 2022-06-06 DIAGNOSIS — N184 Chronic kidney disease, stage 4 (severe): Secondary | ICD-10-CM | POA: Diagnosis not present

## 2022-06-06 DIAGNOSIS — I2510 Atherosclerotic heart disease of native coronary artery without angina pectoris: Secondary | ICD-10-CM | POA: Diagnosis not present

## 2022-06-06 DIAGNOSIS — I130 Hypertensive heart and chronic kidney disease with heart failure and stage 1 through stage 4 chronic kidney disease, or unspecified chronic kidney disease: Secondary | ICD-10-CM | POA: Insufficient documentation

## 2022-06-06 DIAGNOSIS — E042 Nontoxic multinodular goiter: Secondary | ICD-10-CM | POA: Diagnosis not present

## 2022-06-06 DIAGNOSIS — F17210 Nicotine dependence, cigarettes, uncomplicated: Secondary | ICD-10-CM | POA: Insufficient documentation

## 2022-06-06 DIAGNOSIS — Z7901 Long term (current) use of anticoagulants: Secondary | ICD-10-CM | POA: Diagnosis not present

## 2022-06-06 DIAGNOSIS — Z0000 Encounter for general adult medical examination without abnormal findings: Secondary | ICD-10-CM | POA: Diagnosis not present

## 2022-06-06 DIAGNOSIS — Z794 Long term (current) use of insulin: Secondary | ICD-10-CM | POA: Insufficient documentation

## 2022-06-06 DIAGNOSIS — Z23 Encounter for immunization: Secondary | ICD-10-CM | POA: Diagnosis not present

## 2022-06-06 DIAGNOSIS — I480 Paroxysmal atrial fibrillation: Secondary | ICD-10-CM | POA: Diagnosis not present

## 2022-06-06 DIAGNOSIS — I10 Essential (primary) hypertension: Secondary | ICD-10-CM | POA: Diagnosis not present

## 2022-06-06 DIAGNOSIS — Z79899 Other long term (current) drug therapy: Secondary | ICD-10-CM | POA: Insufficient documentation

## 2022-06-06 DIAGNOSIS — I119 Hypertensive heart disease without heart failure: Secondary | ICD-10-CM | POA: Diagnosis not present

## 2022-06-06 DIAGNOSIS — E108 Type 1 diabetes mellitus with unspecified complications: Secondary | ICD-10-CM | POA: Diagnosis not present

## 2022-06-06 NOTE — Patient Instructions (Signed)
It was great to see you today! No medication changes are needed at this time.    Your physician wants you to follow-up in: 4 months with Dr Aundra Dubin (Jan 2024) You will receive a reminder letter in the mail two months in advance. If you don't receive a letter, please call our office to schedule the follow-up appointment in November.  Do the following things EVERYDAY: Weigh yourself in the morning before breakfast. Write it down and keep it in a log. Take your medicines as prescribed Eat low salt foods--Limit salt (sodium) to 2000 mg per day.  Stay as active as you can everyday Limit all fluids for the day to less than 2 liters    At the [facility], you and your health needs are our priority. As part of our continuing mission to provide you with exceptional heart care, we have created designated Provider Care Teams. These Care Teams include your primary Cardiologist (physician) and Advanced Practice Providers (APPs- Physician Assistants and Nurse Practitioners) who all work together to provide you with the care you need, when you need it.   You may see any of the following providers on your designated Care Team at your next follow up: Dr Glori Bickers Dr Loralie Champagne Dr. Roxana Hires, NP Lyda Jester, [place] [facility] [place], [place] Forestine Na, NP Audry Riles, PharmD   Please be sure to bring in all your medications bottles to every appointment.

## 2022-06-06 NOTE — Progress Notes (Addendum)
PCP: Deland Pretty, MD Nephrology: Dr. Moshe Cipro HF Cardiology: Dr. Aundra Dubin  71 y.o. with history of HTN, type 2 diabetes, paroxysmal atrial fibrillation, hyperthyroidism, CKD stage 3, and diastolic CHF/pulmonary hypertension was referred by Dr. Moshe Cipro for evaluation of pulmonary hypertension. Patient has been on apixaban for PAF, currently in NSR and denies palpitations. CKD suspected to be due to HTN and type 2 diabetes.  Several months ago, she noted the development of peripheral edema.  She was started on Lasix and this was increased to 40 mg bid.  She says that this has significantly improved her peripheral edema.  Echoes in 11/21 and 12/22 showed mildly elevated PA pressure with preserved LV systolic function.  She is an active smoker with evidence for COPD on CT chest.  She also was noted to have coronary artery calcification on CT chest.   Initially saw Dr. Aundra Dubin 8/23. RHC and echo arranged to evaluate degree of pulmonary hypertension.   Echo 8/23 showed EF 55-60%, grade I DD, RV normal  RHC 8/23 showed low filling pressures, no pulmonary hypertension and preserved CO.   Today she returns for HF follow up with her sister. Overall feeling fine. She lives with her sister and she helps with her medications. She is not short of breath walking or with ADLs. Denies palpitations, abnormal  bleeding, CP, dizziness, edema, or PND/Orthopnea. Appetite ok. No fever or chills. Weight at home 147 pounds. Taking all medications. BP at home today 130/66. Smokes anywhere from 3 cigs to 1/2 ppd.  ECG (personally reviewed): none ordered today.  Labs (6/23): K 4.4, creatinine 2.84, hgb 10 Labs (9/23): K 4.3, creatinine 2.4  PMH: 1. HTN 2. Type 2 diabetes 3. Hyperthyroidism: On methimazole.  4. Atrial fibrillation: Paroxysmal.  5. CKD stage 3: Likely from DM2 and HTN.  6. CAD: Coronary calcification noted on CT chest.  7. Chronic diastolic CHF: Echo in 53/61 with normal EF, PASP 44 mmHg. Echo  (12/22) with EF 61%, severe LAE, mild-moderate MR, mild TR, PASP 37 mmHg.  - Echo (8/23) showed EF 55-60%, grade I DD, RV normal, normal PASP of 30.8 mmHg. - RHC (8/23): RA 1, PA 20/6 (mean 3), PCWP mean 4, CI (Fick) 4.08 8. Fe deficiency anemia 9. 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   SH: Lives with sister in [place], [place] (near [place]).  Smokes 4-5 cigarettes/day.  Rare ETOH.  Retired, used to work in [employer].   Family History  Problem Relation Age of Onset   Brain cancer Father    Lung cancer Father    Liver cancer Father    Alcoholism Father    Breast cancer Neg Hx    ROS: All systems reviewed and negative except as per HPI.   Current Outpatient Medications  Medication Sig Dispense Refill   apixaban (ELIQUIS) 5 MG TABS tablet Take 5 mg by mouth 2 (two) times daily.     Ascorbic Acid (VITA-C PO) Take 1 tablet by mouth 3 (three) times a week.     atorvastatin (LIPITOR) 40 MG tablet TAKE 1 TABLET EVERY DAY 90 tablet 2   buPROPion (WELLBUTRIN XL) 150 MG 24 hr tablet Take 150 mg by mouth at bedtime.     Calcium Carbonate-Vitamin D 600-5 MG-MCG CAPS Take 1 tablet by mouth daily.     donepezil (ARICEPT) 5 MG tablet Patient takes 5 mg daily by mouth for 4 weeks then increase to 10 mg once daily.     ferrous sulfate (FERROUSUL)  325 (65 FE) MG tablet Take 325 mg by mouth 3 (three) times a week.     furosemide (LASIX) 40 MG tablet Take 0.5 tablets (20 mg total) by mouth daily. 90 tablet 1   hydrALAZINE (APRESOLINE) 100 MG tablet Take 1 tablet (100 mg total) by mouth 3 (three) times daily. 270 tablet 3   insulin aspart (NOVOLOG) 100 UNIT/ML FlexPen Inject 1-4 Units into the skin 3 (three) times daily with meals. Sliding scale Below 150) 151-200 -1 units 201-250=2 units 251-300=3 units 301-400=4 units     insulin degludec (TRESIBA FLEXTOUCH) 100 UNIT/ML FlexTouch Pen Inject 14 Units into the skin every morning.     isosorbide  dinitrate (ISORDIL) 30 MG tablet TAKE 1 TABLET BY MOUTH THREE TIMES A DAY 270 tablet 1   methimazole (TAPAZOLE) 5 MG tablet Take 5 mg by mouth daily.     VITAMIN D PO Take 2,000 Units by mouth daily in the afternoon.     No current facility-administered medications for this encounter.   Wt Readings from Last 3 Encounters:  06/06/22 67 kg (147 lb 9.6 oz)  05/02/22 65.3 kg (144 lb)  04/24/22 65.3 kg (144 lb)   BP (!) 166/70   Pulse 67   Wt 67 kg (147 lb 9.6 oz)   LMP  (LMP Unknown)   SpO2 97%   BMI 23.82 kg/m  Physical Exam: General:  NAD. No resp difficulty HEENT: Normal Neck: Supple. No JVD. Carotids 2+ bilat; no bruits. No lymphadenopathy or thryomegaly appreciated. Cor: PMI nondisplaced. Regular rate & rhythm. No rubs, gallops, 2/6 TR Lungs: Clear Abdomen: Soft, nontender, nondistended. No hepatosplenomegaly. No bruits or masses. Good bowel sounds. Extremities: No cyanosis, clubbing, rash, edema, compression hose on. Neuro: Alert & oriented x 3, cranial nerves grossly intact. Moves all 4 extremities w/o difficulty. Affect pleasant.  Assessment/Plan: 1. Chronic diastolic CHF/pulmonary hypertension: Most recent echo in 12/22 showed EF 61%, severe LAE, mild-moderate MR, mild TR, PASP 37 mmHg.  Prior echo in 11/21 showed PASP 44 mmHg.  She has evidence for COPD on chest CT which can contribute to PA pressure elevation.  Echo (8/23) showed EF EF 55-60%, grade I DD, RV normal, normal PASP of 30.8 mmHg. RHC (8/23) showed low filling pressures, no pulmonary hypertension and preserved cardiac output. NYHA I-II symptoms today, she is not volume overloaded on exam. - Continue Lasix 20 mg daily. OK to take additional tablet occasionally for SOB/edema. - Continue compression hose. 2. Atrial fibrillation: Paroxysmal. She is regular on exam today and denies palpitations.  - Continue Eliquis. No bleeding issues. 3. HTN: BP elevated in office today but SBP runs 130s on home log, she checks daily.   - Continue hydralazine 10 mg tid + Isordil 30 mg daily.  - Consider addition of amlodipine if BP not at goal. 4. CKD: Stage 4, followed by Dr. Moshe Cipro. Lisinopril stopped last visit with SCr up to 3.20 Recent BMET with nephrology reviewed and stable. SCr 2.4, gFR 21. - Consider SGLT2 inhibitor if GFR remains stable.   - Avoid NSAIDs.  5. Hyperthyroidism: She is on methimazole and followed by endocrinology.  6. COPD/smoking: CT chest with emphysema.   - I strongly recommended that she quit smoking.  She is on bupropion without much effect, she failed Chantix in the past.  7. CAD: Coronary calcification noted on CT chest.  No chest pain.  - Continue atorvastatin.   Follow up in 4 months with Dr. Aundra Dubin.   Maricela Bo [facility] FNP-BC 06/06/2022

## 2022-06-06 NOTE — Telephone Encounter (Signed)
Will increase hydralazine to 100 mg tid.  Thanks MJP

## 2022-06-07 ENCOUNTER — Other Ambulatory Visit: Payer: Self-pay

## 2022-06-07 DIAGNOSIS — I10 Essential (primary) hypertension: Secondary | ICD-10-CM

## 2022-06-07 MED ORDER — ISOSORBIDE DINITRATE 30 MG PO TABS: ORAL_TABLET | ORAL | 3 refills | Status: DC

## 2022-06-07 NOTE — Telephone Encounter (Signed)
Agree 

## 2022-06-16 DIAGNOSIS — I10 Essential (primary) hypertension: Secondary | ICD-10-CM | POA: Diagnosis not present

## 2022-06-16 DIAGNOSIS — E118 Type 2 diabetes mellitus with unspecified complications: Secondary | ICD-10-CM | POA: Diagnosis not present

## 2022-06-16 DIAGNOSIS — E785 Hyperlipidemia, unspecified: Secondary | ICD-10-CM | POA: Diagnosis not present

## 2022-06-16 DIAGNOSIS — F339 Major depressive disorder, recurrent, unspecified: Secondary | ICD-10-CM | POA: Diagnosis not present

## 2022-06-21 DIAGNOSIS — Z23 Encounter for immunization: Secondary | ICD-10-CM | POA: Diagnosis not present

## 2022-06-22 DIAGNOSIS — I10 Essential (primary) hypertension: Secondary | ICD-10-CM | POA: Diagnosis not present

## 2022-06-26 ENCOUNTER — Emergency Department (HOSPITAL_COMMUNITY)
Admission: EM | Admit: 2022-06-26 | Discharge: 2022-06-27 | Disposition: A | Discharge status: Home / Self Care | Payer: Medicare Other | Attending: Emergency Medicine | Admitting: Emergency Medicine

## 2022-06-26 ENCOUNTER — Other Ambulatory Visit: Payer: Self-pay

## 2022-06-26 ENCOUNTER — Emergency Department (HOSPITAL_COMMUNITY): Payer: Medicare Other

## 2022-06-26 ENCOUNTER — Encounter (HOSPITAL_COMMUNITY): Payer: Self-pay | Admitting: *Deleted

## 2022-06-26 DIAGNOSIS — R058 Other specified cough: Secondary | ICD-10-CM

## 2022-06-26 DIAGNOSIS — Z79899 Other long term (current) drug therapy: Secondary | ICD-10-CM | POA: Diagnosis not present

## 2022-06-26 DIAGNOSIS — I10 Essential (primary) hypertension: Secondary | ICD-10-CM | POA: Diagnosis not present

## 2022-06-26 DIAGNOSIS — D649 Anemia, unspecified: Secondary | ICD-10-CM | POA: Insufficient documentation

## 2022-06-26 DIAGNOSIS — N189 Chronic kidney disease, unspecified: Secondary | ICD-10-CM | POA: Diagnosis not present

## 2022-06-26 DIAGNOSIS — N184 Chronic kidney disease, stage 4 (severe): Secondary | ICD-10-CM | POA: Insufficient documentation

## 2022-06-26 DIAGNOSIS — E871 Hypo-osmolality and hyponatremia: Secondary | ICD-10-CM | POA: Insufficient documentation

## 2022-06-26 DIAGNOSIS — Z7901 Long term (current) use of anticoagulants: Secondary | ICD-10-CM | POA: Diagnosis not present

## 2022-06-26 DIAGNOSIS — I129 Hypertensive chronic kidney disease with stage 1 through stage 4 chronic kidney disease, or unspecified chronic kidney disease: Secondary | ICD-10-CM | POA: Diagnosis not present

## 2022-06-26 DIAGNOSIS — R059 Cough, unspecified: Secondary | ICD-10-CM | POA: Diagnosis not present

## 2022-06-26 DIAGNOSIS — Z794 Long term (current) use of insulin: Secondary | ICD-10-CM | POA: Insufficient documentation

## 2022-06-26 DIAGNOSIS — E1122 Type 2 diabetes mellitus with diabetic chronic kidney disease: Secondary | ICD-10-CM | POA: Insufficient documentation

## 2022-06-26 DIAGNOSIS — R531 Weakness: Secondary | ICD-10-CM | POA: Diagnosis not present

## 2022-06-26 DIAGNOSIS — N289 Disorder of kidney and ureter, unspecified: Secondary | ICD-10-CM | POA: Diagnosis not present

## 2022-06-26 HISTORY — DX: Chronic kidney disease, stage 4 (severe): N18.4

## 2022-06-26 HISTORY — DX: Unspecified dementia, unspecified severity, without behavioral disturbance, psychotic disturbance, mood disturbance, and anxiety: F03.90

## 2022-06-26 LAB — BASIC METABOLIC PANEL
Anion gap: 7 (ref 5–15)
BUN: 50 mg/dL — ABNORMAL HIGH (ref 8–23)
CO2: 23 mmol/L (ref 22–32)
Calcium: 8.4 mg/dL — ABNORMAL LOW (ref 8.9–10.3)
Chloride: 103 mmol/L (ref 98–111)
Creatinine, Ser: 2.3 mg/dL — ABNORMAL HIGH (ref 0.44–1.00)
GFR, Estimated: 22 mL/min — ABNORMAL LOW (ref >60)
Glucose, Bld: 157 mg/dL — ABNORMAL HIGH (ref 70–99)
Potassium: 3.9 mmol/L (ref 3.5–5.1)
Sodium: 133 mmol/L — ABNORMAL LOW (ref 135–145)

## 2022-06-26 LAB — URINALYSIS, ROUTINE W REFLEX MICROSCOPIC
Bilirubin Urine: NEGATIVE
Glucose, UA: 50 mg/dL — AB
Hgb urine dipstick: NEGATIVE
Ketones, ur: NEGATIVE mg/dL
Leukocytes,Ua: NEGATIVE
Nitrite: NEGATIVE
Protein, ur: >=300 mg/dL — AB
Specific Gravity, Urine: 1.017 (ref 1.005–1.030)
pH: 5 (ref 5.0–8.0)

## 2022-06-26 LAB — CBC WITH DIFFERENTIAL/PLATELET
Abs Immature Granulocytes: 0.04 10*3/uL (ref 0.00–0.07)
Basophils Absolute: 0 10*3/uL (ref 0.0–0.1)
Basophils Relative: 0 %
Eosinophils Absolute: 0 10*3/uL (ref 0.0–0.5)
Eosinophils Relative: 0 %
HCT: 29.6 % — ABNORMAL LOW (ref 36.0–46.0)
Hemoglobin: 9.9 g/dL — ABNORMAL LOW (ref 12.0–15.0)
Immature Granulocytes: 0 %
Lymphocytes Relative: 6 %
Lymphs Abs: 0.7 10*3/uL (ref 0.7–4.0)
MCH: 31.3 pg (ref 26.0–34.0)
MCHC: 33.4 g/dL (ref 30.0–36.0)
MCV: 93.7 fL (ref 80.0–100.0)
Monocytes Absolute: 0.7 10*3/uL (ref 0.1–1.0)
Monocytes Relative: 6 %
Neutro Abs: 10.2 10*3/uL — ABNORMAL HIGH (ref 1.7–7.7)
Neutrophils Relative %: 88 %
Platelets: 230 10*3/uL (ref 150–400)
RBC: 3.16 MIL/uL — ABNORMAL LOW (ref 3.87–5.11)
RDW: 13.3 % (ref 11.5–15.5)
WBC: 11.7 10*3/uL — ABNORMAL HIGH (ref 4.0–10.5)
nRBC: 0 % (ref 0.0–0.2)

## 2022-06-26 NOTE — ED Triage Notes (Signed)
Pt with generalized weakness. + emesis x 1 today.  + nausea.  Denies diarrhea, LBM today and normal per pt. +cough NP per pt. Pt denies any pain.  + fatigue.  Family states pt has laying in the bed more than usual today.

## 2022-06-27 MED ORDER — ONDANSETRON 8 MG PO TBDP: 8.0000 mg | ORAL_TABLET | Freq: Three times a day (TID) | ORAL | 0 refills | Status: AC | PRN

## 2022-06-27 NOTE — ED Provider Notes (Incomplete)
[facility] EMERGENCY DEPARTMENT Provider Note   CSN: [id number] Arrival date & time: 06/26/22  2012     History {Add pertinent medical, surgical, social history, OB history to HPI:1} Chief Complaint  Patient presents with  . Weakness    Carol Valenzuela is a 71 y.o. female.  The history is provided by the patient.  Weakness She has history of hypertension, diabetes, hyperlipidemia, chronic kidney disease stage IV, diastolic heart failure, paroxysmal atrial fibrillation anticoagulated on apixaban   Home Medications Prior to Admission medications   Medication Sig Start Date End Date Taking? Authorizing Provider  apixaban (ELIQUIS) 5 MG TABS tablet Take 5 mg by mouth 2 (two) times daily.    [provider]  Ascorbic Acid (VITA-C PO) Take 1 tablet by mouth 3 (three) times a week.    [provider]  atorvastatin (LIPITOR) 40 MG tablet TAKE 1 TABLET EVERY DAY 02/23/22   Patwardhan, Manish J, MD  buPROPion (WELLBUTRIN XL) 150 MG 24 hr tablet Take 150 mg by mouth at bedtime. 06/20/20   [provider]  Calcium Carbonate-Vitamin D 600-5 MG-MCG CAPS Take 1 tablet by mouth daily.    [provider]  donepezil (ARICEPT) 5 MG tablet Patient takes 5 mg daily by mouth for 4 weeks then increase to 10 mg once daily.    [provider]  ferrous sulfate (FERROUSUL) 325 (65 FE) MG tablet Take 325 mg by mouth 3 (three) times a week.    [provider]  furosemide (LASIX) 40 MG tablet Take 0.5 tablets (20 mg total) by mouth daily. 05/02/22   Larey Dresser, MD  hydrALAZINE (APRESOLINE) 100 MG tablet Take 1 tablet (100 mg total) by mouth 3 (three) times daily. 04/25/22   Larey Dresser, MD  insulin aspart (NOVOLOG) 100 UNIT/ML FlexPen Inject 1-4 Units into the skin 3 (three) times daily with meals. Sliding scale Below 150) 151-200 -1 units 201-250=2 units 251-300=3 units 301-400=4 units    [provider]  insulin degludec (TRESIBA  FLEXTOUCH) 100 UNIT/ML FlexTouch Pen Inject 14 Units into the skin every morning.    [provider]  isosorbide dinitrate (ISORDIL) 30 MG tablet Take 2 tablets (60 mg total) by mouth three times daily. 06/07/22   Patwardhan, Reynold Bowen, MD  methimazole (TAPAZOLE) 5 MG tablet Take 5 mg by mouth daily.    [provider]  VITAMIN D PO Take 2,000 Units by mouth daily in the afternoon.    [provider]      Allergies    Patient has no known allergies.    Review of Systems   Review of Systems  Neurological:  Positive for weakness.  All other systems reviewed and are negative.   Physical Exam Updated Vital Signs BP (!) 164/69   Pulse 68   Temp 97.9 F (36.6 C) (Oral)   Resp 18   Ht 5' 6.5" (1.689 m)   Wt 66.2 kg   LMP  (LMP Unknown)   SpO2 92%   BMI 23.21 kg/m  Physical Exam Vitals and nursing note reviewed.   71 year old female, resting comfortably and in no acute distress. Vital signs are ***. Oxygen saturation is ***%, which is normal. Head is normocephalic and atraumatic. PERRLA, EOMI. Oropharynx is clear. Neck is nontender and supple without adenopathy or JVD. Back is nontender and there is no CVA tenderness. Lungs are clear without rales, wheezes, or rhonchi. Chest is nontender. Heart has regular rate and rhythm without murmur.  Abdomen is soft, flat, nontender without masses or hepatosplenomegaly and peristalsis is normoactive. Extremities have no cyanosis or edema, full range of motion is present. Skin is warm and dry without rash. Neurologic: Mental status is normal, cranial nerves are intact, there are no motor or sensory deficits.  ED Results / Procedures / Treatments   Labs (all labs ordered are listed, but only abnormal results are displayed) Labs Reviewed  CBC WITH DIFFERENTIAL/PLATELET - Abnormal; Notable for the following components:      Result Value   WBC 11.7 (*)    RBC 3.16 (*)    Hemoglobin 9.9 (*)    HCT 29.6 (*)    Neutro  Abs 10.2 (*)    All other components within normal limits  BASIC METABOLIC PANEL - Abnormal; Notable for the following components:   Sodium 133 (*)    Glucose, Bld 157 (*)    BUN 50 (*)    Creatinine, Ser 2.30 (*)    Calcium 8.4 (*)    GFR, Estimated 22 (*)    All other components within normal limits  URINALYSIS, ROUTINE W REFLEX MICROSCOPIC - Abnormal; Notable for the following components:   APPearance HAZY (*)    Glucose, UA 50 (*)    Protein, ur >=300 (*)    Bacteria, UA RARE (*)    All other components within normal limits    EKG None  Radiology DG Chest 2 View  Result Date: 06/26/2022 CLINICAL DATA:  Weakness EXAM: CHEST - 2 VIEW COMPARISON:  CT chest 08/04/2021 FINDINGS: Cardiac enlargement without heart failure. Atherosclerotic calcification aortic arch. Lungs clear without infiltrate effusion or mass. No acute skeletal abnormality. IMPRESSION: No active cardiopulmonary disease. Electronically Signed   By: Franchot Gallo M.D.   On: 06/26/2022 20:55    Procedures Procedures  {Document cardiac monitor, telemetry assessment procedure when appropriate:1}  Medications Ordered in ED Medications - No data to display  ED Course/ Medical Decision Making/ A&P                           Medical Decision Making Amount and/or Complexity of Data Reviewed Labs: ordered. Radiology: ordered.   ***  {Document critical care time when appropriate:1} {Document review of labs and clinical decision tools ie heart score, Chads2Vasc2 etc:1}  {Document your independent review of radiology images, and any outside records:1} {Document your discussion with family members, caretakers, and with consultants:1} {Document social determinants of health affecting pt's care:1} {Document your decision making why or why not admission, treatments were needed:1} Final Clinical Impression(s) / ED Diagnoses Final diagnoses:  None    Rx / DC Orders ED Discharge Orders     None

## 2022-06-27 NOTE — Discharge Instructions (Signed)
Your evaluation today did not show any sign of pneumonia or urinary tract infection, and your kidney function is stable.  However, please return if you have any new or concerning symptoms.

## 2022-06-27 NOTE — ED Provider Notes (Signed)
[facility] EMERGENCY DEPARTMENT Provider Note   CSN: [id number] Arrival date & time: 06/26/22  2012     History  Chief Complaint  Patient presents with   Weakness    HANG AMMON is a 71 y.o. female.  The history is provided by the patient and a relative. The history is limited by the condition of the patient (Dementia).  Weakness She has history of hypertension, diabetes, hyperlipidemia, chronic kidney disease stage IV, diastolic heart failure, paroxysmal atrial fibrillation anticoagulated on apixaban and is brought in by family because she looked like she was gagging and about to throw up and has seemed generally weak.  She had no appetite at dinner although she ate a normal lunch.  She does complain of some chills but there has been no fever.  Highest temperature at home was 99.4.  She does have a chronic cough which has been worse over the last few days.  Cough is nonproductive.  Family is concerned about possible pneumonia, possible urinary tract infection.   Home Medications Prior to Admission medications   Medication Sig Start Date End Date Taking? Authorizing Provider  apixaban (ELIQUIS) 5 MG TABS tablet Take 5 mg by mouth 2 (two) times daily.    [provider]  Ascorbic Acid (VITA-C PO) Take 1 tablet by mouth 3 (three) times a week.    [provider]  atorvastatin (LIPITOR) 40 MG tablet TAKE 1 TABLET EVERY DAY 02/23/22   Patwardhan, Manish J, MD  buPROPion (WELLBUTRIN XL) 150 MG 24 hr tablet Take 150 mg by mouth at bedtime. 06/20/20   [provider]  Calcium Carbonate-Vitamin D 600-5 MG-MCG CAPS Take 1 tablet by mouth daily.    [provider]  donepezil (ARICEPT) 5 MG tablet Patient takes 5 mg daily by mouth for 4 weeks then increase to 10 mg once daily.    [provider]  ferrous sulfate (FERROUSUL) 325 (65 FE) MG tablet Take 325 mg by mouth 3 (three) times a week.    [provider]  furosemide (LASIX) 40 MG tablet  Take 0.5 tablets (20 mg total) by mouth daily. 05/02/22   Larey Dresser, MD  hydrALAZINE (APRESOLINE) 100 MG tablet Take 1 tablet (100 mg total) by mouth 3 (three) times daily. 04/25/22   Larey Dresser, MD  insulin aspart (NOVOLOG) 100 UNIT/ML FlexPen Inject 1-4 Units into the skin 3 (three) times daily with meals. Sliding scale Below 150) 151-200 -1 units 201-250=2 units 251-300=3 units 301-400=4 units    [provider]  insulin degludec (TRESIBA FLEXTOUCH) 100 UNIT/ML FlexTouch Pen Inject 14 Units into the skin every morning.    [provider]  isosorbide dinitrate (ISORDIL) 30 MG tablet Take 2 tablets (60 mg total) by mouth three times daily. 06/07/22   Patwardhan, Reynold Bowen, MD  methimazole (TAPAZOLE) 5 MG tablet Take 5 mg by mouth daily.    [provider]  VITAMIN D PO Take 2,000 Units by mouth daily in the afternoon.    [provider]      Allergies    Patient has no known allergies.    Review of Systems   Review of Systems  Unable to perform ROS: Dementia  Neurological:  Positive for weakness.    Physical Exam Updated Vital Signs BP (!) 164/69   Pulse 68   Temp 97.9 F (36.6 C) (Oral)   Resp 18   Ht 5' 6.5" (1.689 m)   Wt 66.2 kg   LMP  (  LMP Unknown)   SpO2 92%   BMI 23.21 kg/m  Physical Exam Vitals and nursing note reviewed.   71 year old female, resting comfortably and in no acute distress. Vital signs are significant for elevated systolic blood pressure. Oxygen saturation is 92%, which is normal. Head is normocephalic and atraumatic. PERRLA, EOMI. Oropharynx is clear. Neck is nontender and supple without adenopathy or JVD. Back is nontender and there is no CVA tenderness. Lungs are clear without rales, wheezes, or rhonchi. Chest is nontender. Heart has regular rate and rhythm without murmur. Abdomen is soft, flat, nontender. Extremities have no cyanosis or edema, full range of motion is present. Skin is warm and dry  without rash. Neurologic: Awake and alert, cranial nerves are intact, moves all extremities equally.  ED Results / Procedures / Treatments   Labs (all labs ordered are listed, but only abnormal results are displayed) Labs Reviewed  CBC WITH DIFFERENTIAL/PLATELET - Abnormal; Notable for the following components:      Result Value   WBC 11.7 (*)    RBC 3.16 (*)    Hemoglobin 9.9 (*)    HCT 29.6 (*)    Neutro Abs 10.2 (*)    All other components within normal limits  BASIC METABOLIC PANEL - Abnormal; Notable for the following components:   Sodium 133 (*)    Glucose, Bld 157 (*)    BUN 50 (*)    Creatinine, Ser 2.30 (*)    Calcium 8.4 (*)    GFR, Estimated 22 (*)    All other components within normal limits  URINALYSIS, ROUTINE W REFLEX MICROSCOPIC - Abnormal; Notable for the following components:   APPearance HAZY (*)    Glucose, UA 50 (*)    Protein, ur >=300 (*)    Bacteria, UA RARE (*)    All other components within normal limits   Radiology DG Chest 2 View  Result Date: 06/26/2022 CLINICAL DATA:  Weakness EXAM: CHEST - 2 VIEW COMPARISON:  CT chest 08/04/2021 FINDINGS: Cardiac enlargement without heart failure. Atherosclerotic calcification aortic arch. Lungs clear without infiltrate effusion or mass. No acute skeletal abnormality. IMPRESSION: No active cardiopulmonary disease. Electronically Signed   By: Franchot Gallo M.D.   On: 06/26/2022 20:55    Procedures Procedures    Medications Ordered in ED Medications - No data to display  ED Course/ Medical Decision Making/ A&P                           Medical Decision Making Amount and/or Complexity of Data Reviewed Labs: ordered. Radiology: ordered.   Weakness and cough.  Possible pneumonia, possible electrolyte disturbance, possible occult urinary tract infection, possible viral illness.  Chest x-ray shows no evidence of pneumonia or other acute process.  I have independently viewed the images, and agree with the  radiologist's interpretation.  I reviewed and interpreted her laboratory test, and my interpretation is stable anemia, mild leukocytosis which is nonspecific, borderline hyponatremia which is not felt to be clinically significant, stable renal insufficiency.  Urinalysis does show presence of glucose and protein but no evidence of UTI.  I have discussed with family possibility of doing additional testing such as influenza, COVID-19, hepatic function panel.  They feel comfortable knowing that she does not have pneumonia or UTI and would prefer to take her home to follow-up with PCP.  I feel this is a reasonable approach, but strict return precautions have been given.  Since she did seem  to have some nausea at home, I have given a prescription for ondansetron oral dissolving tablet.  Final Clinical Impression(s) / ED Diagnoses Final diagnoses:  Weakness  Nonproductive cough  Renal insufficiency  Normochromic normocytic anemia  Hyponatremia  Chronic anticoagulation  Elevated blood pressure reading with diagnosis of hypertension    Rx / DC Orders ED Discharge Orders          Ordered    ondansetron (ZOFRAN-ODT) 8 MG disintegrating tablet  Every 8 hours PRN        06/27/22 [id number]              Delora Fuel, MD 79/02/40 0021

## 2022-07-06 ENCOUNTER — Other Ambulatory Visit: Payer: Self-pay

## 2022-07-06 ENCOUNTER — Telehealth: Payer: Self-pay | Admitting: Cardiology

## 2022-07-06 DIAGNOSIS — I10 Essential (primary) hypertension: Secondary | ICD-10-CM

## 2022-07-06 MED ORDER — ISOSORBIDE DINITRATE 30 MG PO TABS: 30.0000 mg | ORAL_TABLET | Freq: Every day | ORAL | 0 refills | Status: DC

## 2022-07-06 NOTE — Telephone Encounter (Signed)
Patient's sister says patient takes isosorbide 30 MG 6x per day. Prescription is running out, patient's sister says pharmacy will not refill it until December, unless the doctor approves a sooner refill. She is wondering can someone please refill this, and can someone make it 60 MG, 3x per day instead if possible. [facility] [place] [facility]. Thank you.

## 2022-07-09 DIAGNOSIS — Z23 Encounter for immunization: Secondary | ICD-10-CM | POA: Diagnosis not present

## 2022-07-09 NOTE — Telephone Encounter (Signed)
Spoke with patient. Will FU to make sure patient pharmacy received Rx.

## 2022-07-10 NOTE — Telephone Encounter (Signed)
Blood pressure are mildly elevated but I would avoid making changes given recent fall. Will discuss further during upcoming office visit in 07/2022. Please keep me posted if there are any high blood pressure alerts before then.  Thanks MJP

## 2022-07-12 ENCOUNTER — Other Ambulatory Visit: Payer: Self-pay

## 2022-07-12 DIAGNOSIS — I10 Essential (primary) hypertension: Secondary | ICD-10-CM

## 2022-07-12 MED ORDER — ISOSORBIDE DINITRATE 30 MG PO TABS: ORAL_TABLET | ORAL | 0 refills | Status: DC

## 2022-07-12 NOTE — Telephone Encounter (Signed)
Updating RX sig and resending at [facility]'s request.

## 2022-07-13 DIAGNOSIS — G309 Alzheimer's disease, unspecified: Secondary | ICD-10-CM | POA: Diagnosis not present

## 2022-07-13 DIAGNOSIS — F0280 Dementia in other diseases classified elsewhere without behavioral disturbance: Secondary | ICD-10-CM | POA: Diagnosis not present

## 2022-07-17 DIAGNOSIS — E785 Hyperlipidemia, unspecified: Secondary | ICD-10-CM | POA: Diagnosis not present

## 2022-07-17 DIAGNOSIS — I10 Essential (primary) hypertension: Secondary | ICD-10-CM | POA: Diagnosis not present

## 2022-07-17 DIAGNOSIS — F339 Major depressive disorder, recurrent, unspecified: Secondary | ICD-10-CM | POA: Diagnosis not present

## 2022-07-17 DIAGNOSIS — E118 Type 2 diabetes mellitus with unspecified complications: Secondary | ICD-10-CM | POA: Diagnosis not present

## 2022-07-23 DIAGNOSIS — I10 Essential (primary) hypertension: Secondary | ICD-10-CM | POA: Diagnosis not present

## 2022-07-27 ENCOUNTER — Telehealth: Payer: Self-pay | Admitting: Cardiology

## 2022-07-27 ENCOUNTER — Encounter: Payer: Self-pay | Admitting: Cardiology

## 2022-07-27 ENCOUNTER — Ambulatory Visit: Payer: Medicare Other | Admitting: Cardiology

## 2022-07-27 ENCOUNTER — Other Ambulatory Visit: Payer: Self-pay

## 2022-07-27 ENCOUNTER — Ambulatory Visit: Payer: 59 | Admitting: Cardiology

## 2022-07-27 VITALS — BP 180/80 | HR 66 | Temp 97.4°F | Resp 16 | Ht 66.0 in | Wt 149.6 lb

## 2022-07-27 DIAGNOSIS — I10 Essential (primary) hypertension: Secondary | ICD-10-CM

## 2022-07-27 DIAGNOSIS — I480 Paroxysmal atrial fibrillation: Secondary | ICD-10-CM

## 2022-07-27 DIAGNOSIS — N184 Chronic kidney disease, stage 4 (severe): Secondary | ICD-10-CM | POA: Diagnosis not present

## 2022-07-27 DIAGNOSIS — I129 Hypertensive chronic kidney disease with stage 1 through stage 4 chronic kidney disease, or unspecified chronic kidney disease: Secondary | ICD-10-CM | POA: Diagnosis not present

## 2022-07-27 DIAGNOSIS — I2510 Atherosclerotic heart disease of native coronary artery without angina pectoris: Secondary | ICD-10-CM | POA: Diagnosis not present

## 2022-07-27 MED ORDER — ISOSORBIDE DINITRATE 30 MG PO TABS: ORAL_TABLET | ORAL | 3 refills | Status: DC

## 2022-07-27 NOTE — Progress Notes (Signed)
Follow up visit  Subjective:   Carol Valenzuela, female    DOB: 1951/07/06, 71 y.o.   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 Blood pressure has been elevated , see home monitoring data below.  Average Systolic BP Level 195.09 mmHg Lowest Systolic BP Level 326 mmHg Highest Systolic BP Level 712 mmHg  07/27/2022 Friday at 07:47 AM 154 / 84  07/26/2022 Thursday at 07:08 PM 136 / 77  07/26/2022 Thursday at 08:10 AM 164 / 89  07/25/2022 Wednesday at 08:05 PM 147 / 74  07/25/2022 Wednesday at 07:56 AM 149 / 76  07/24/2022 Tuesday at 08:08 AM 151 / 85  07/23/2022 Monday at 08:03 AM 170 / 94  07/23/2022 Monday at 08:02 AM 174 / 91  07/23/2022 Monday at 08:00 AM 178 / 94  07/22/2022 Sunday at 07:48 AM 166 / 90  07/21/2022 Saturday at 07:35 PM 116 / 63      Current Outpatient Medications:    apixaban (ELIQUIS) 5 MG TABS tablet, Take 5 mg by mouth 2 (two) times daily., Disp: , Rfl:    Ascorbic Acid (VITA-C PO), Take 1 tablet by mouth 3 (three) times a week., Disp: , Rfl:    atorvastatin (LIPITOR) 40 MG tablet, TAKE 1 TABLET EVERY DAY, Disp: 90 tablet, Rfl: 2   buPROPion (WELLBUTRIN XL) 150 MG 24 hr tablet, Take 150 mg by mouth at bedtime., Disp: , Rfl:    Calcium Carbonate-Vitamin D 600-5 MG-MCG CAPS, Take 1 tablet by mouth daily., Disp: , Rfl:    donepezil (ARICEPT) 5 MG tablet, Patient takes 5 mg daily by mouth for 4 weeks then increase to 10 mg once daily., Disp: , Rfl:    ferrous sulfate (FERROUSUL) 325 (65 FE) MG tablet, Take 325 mg by mouth 3 (three) times a week., Disp: , Rfl:    furosemide (LASIX) 40 MG tablet, Take 0.5 tablets (20 mg total) by mouth daily., Disp: 90 tablet, Rfl: 1   hydrALAZINE (APRESOLINE) 100 MG  tablet, Take 1 tablet (100 mg total) by mouth 3 (three) times daily., Disp: 270 tablet, Rfl: 3   insulin aspart (NOVOLOG) 100 UNIT/ML FlexPen, Inject 1-4 Units into the skin 3 (three) times daily with meals. Sliding scale Below 150) 151-200 -1 units 201-250=2 units 251-300=3 units 301-400=4 units, Disp: , Rfl:    insulin degludec (TRESIBA FLEXTOUCH) 100 UNIT/ML FlexTouch Pen, Inject 14 Units into the skin every morning., Disp: , Rfl:    isosorbide dinitrate (ISORDIL) 30 MG tablet, Take 2 tablets (60 mg total) by mouth three times daily., Disp: 180 tablet, Rfl: 0   methimazole (TAPAZOLE) 5 MG tablet, Take 5 mg by mouth daily., Disp: , Rfl:    ondansetron (ZOFRAN-ODT) 8 MG disintegrating tablet, Take 1 tablet (8 mg total) by mouth every 8 (eight) hours as needed for nausea or vomiting., Disp: 20 tablet, Rfl: 0   VITAMIN D PO, Take 2,000 Units by mouth daily in the afternoon., Disp: , Rfl:     Cardiovascular & other pertient studies:  EKG 07/27/2022: Sinus rhythm 65 bpm  Right bundle branch block  Echocardiogram 08/17/2021: Left ventricle cavity is normal in size. Mild concentric hypertrophy of the left ventricle. Normal global wall motion. Normal  LV systolic function with EF 61%. Doppler evidence of grade I (impaired) diastolic dysfunction, normal LAP.  Left atrial cavity is severely dilated. Structurally normal trileaflet aortic valve.  Mild (Grade I) aortic regurgitation. Structurally normal mitral valve.  Mild to moderate mitral regurgitation. Structurally normal tricuspid valve.  Mild tricuspid regurgitation. Estimated pulmonary artery systolic pressure 37 mmHg. Previous study on 08/04/2020 noted grade II diastolic dysfunction, mild RA enlargement, estimated PASP 44 mmHg.  CT chest 08/05/2019: 1. Lung-RADS 2S, benign appearance or behavior. Continue annual screening with low-dose chest CT without contrast in 12 months. 2. The "S" modifier above refers to potentially  clinically significant non lung cancer related findings. Specifically, there is aortic atherosclerosis, in addition to left anterior descending coronary artery disease. Please note that although the presence of coronary artery calcium documents the presence of coronary artery disease, the severity of this disease and any potential stenosis cannot be assessed on this non-gated CT examination. Assessment for potential risk factor modification, dietary therapy or pharmacologic therapy may be warranted, if clinically indicated. 3. Mild diffuse bronchial wall thickening with mild centrilobular and paraseptal emphysema; imaging findings suggestive of underlying COPD.   Aortic Atherosclerosis (ICD10-I70.0) and Emphysema (ICD10-J43.9).  US thyroid 05/2020: 1. Thyromegaly with bilateral nodules. Recommend FNA biopsy of moderately suspicious 1.9 cm inferior left nodule. 2. Recommend annual/biennial ultrasound follow-up of additional nodules as above, until stability x5 years confirmed.  Recent labs: 03/01/2022: Glucose 238, BUN/Cr 37/2.8. EGFR 20.  HbA1C 7.3% Chol 213, TG 167, HDL 104, LDL 71 TSH 3.7 normal  02/05/2022: Glucose 90, BUN/Cr 26/2.15. EGFR 24. Na/K 137/3.8.  HbA1C 8.1% Tsat 10% low  Review of Systems  Constitutional: Positive for malaise/fatigue.  Cardiovascular:  Negative for chest pain, dyspnea on exertion, leg swelling, palpitations and syncope.         Vitals:   07/27/22 1100 07/27/22 1104  BP: (!) 186/82 (!) 180/80  Pulse: 65 66  Resp: 16   Temp: (!) 97.4 F (36.3 C)   SpO2: 96%      Body mass index is 24.15 kg/m. Filed Weights   07/27/22 1100  Weight: 149 lb 9.6 oz (67.9 kg)     Objective:   Physical Exam Vitals and nursing note reviewed.  Constitutional:      General: She is not in acute distress. Neck:     Vascular: No JVD.  Cardiovascular:     Rate and Rhythm: Normal rate and regular rhythm.     Pulses: Intact distal pulses.     Heart  sounds: Murmur heard.     High-pitched blowing holosystolic murmur is present with a grade of 3/6 at the apex.  Pulmonary:     Effort: Pulmonary effort is normal.     Breath sounds: Normal breath sounds. No wheezing or rales.  Musculoskeletal:     Right lower leg: No edema.     Left lower leg: No edema.         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 No angina symptoms. Not on aspirin due to ongoing use of eliquis. Continue metoprolol. Lipids well controlled. Strongly encourage smoking cessation.  Paroxysmal atrial fibrillation: In sinus rhythm today. Continue anticoagulation with Eliquis 5 mg twice daily for CHADSVASc Score of 5.  See below regarding beta-blocker.  Hypertension: Uncontrolled. Labetalol was previously stopped previously by Dr. Moshe Cipro as BP was running lower. However, she has been quite hypertensive now. Therefore, resume labetalol 100 mg bid.   Hyperthyroidism: Continue f/u w/Endorcinology.    MR, TR, pulmonary hypertension: Likely driven by hypertnesion, as well as paroxysmal Afib. 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 Clinically stable.   F/u in 6 months  Burl Tauzin Esther Hardy, MD [facility] [place] Pager: [phone] Office: [phone]

## 2022-07-27 NOTE — Telephone Encounter (Signed)
Patient was on time and arrived to her scheduled appointment on 07/27/22. The no show on her initial appointment was done by mistake, please advise.

## 2022-07-30 DIAGNOSIS — Z720 Tobacco use: Secondary | ICD-10-CM | POA: Diagnosis not present

## 2022-07-30 DIAGNOSIS — E1122 Type 2 diabetes mellitus with diabetic chronic kidney disease: Secondary | ICD-10-CM | POA: Diagnosis not present

## 2022-07-30 DIAGNOSIS — I272 Pulmonary hypertension, unspecified: Secondary | ICD-10-CM | POA: Diagnosis not present

## 2022-07-30 DIAGNOSIS — I129 Hypertensive chronic kidney disease with stage 1 through stage 4 chronic kidney disease, or unspecified chronic kidney disease: Secondary | ICD-10-CM | POA: Diagnosis not present

## 2022-07-30 DIAGNOSIS — D631 Anemia in chronic kidney disease: Secondary | ICD-10-CM | POA: Diagnosis not present

## 2022-07-30 DIAGNOSIS — N184 Chronic kidney disease, stage 4 (severe): Secondary | ICD-10-CM | POA: Diagnosis not present

## 2022-08-02 DIAGNOSIS — H35033 Hypertensive retinopathy, bilateral: Secondary | ICD-10-CM | POA: Diagnosis not present

## 2022-08-02 DIAGNOSIS — H25013 Cortical age-related cataract, bilateral: Secondary | ICD-10-CM | POA: Diagnosis not present

## 2022-08-02 DIAGNOSIS — H2513 Age-related nuclear cataract, bilateral: Secondary | ICD-10-CM | POA: Diagnosis not present

## 2022-08-02 DIAGNOSIS — E113293 Type 2 diabetes mellitus with mild nonproliferative diabetic retinopathy without macular edema, bilateral: Secondary | ICD-10-CM | POA: Diagnosis not present

## 2022-08-06 ENCOUNTER — Ambulatory Visit
Admission: RE | Admit: 2022-08-06 | Discharge: 2022-08-06 | Disposition: A | Discharge status: Home / Self Care | Payer: Medicare Other | Source: Ambulatory Visit | Attending: Internal Medicine | Admitting: Internal Medicine

## 2022-08-06 DIAGNOSIS — F17210 Nicotine dependence, cigarettes, uncomplicated: Secondary | ICD-10-CM | POA: Diagnosis not present

## 2022-08-06 DIAGNOSIS — Z87891 Personal history of nicotine dependence: Secondary | ICD-10-CM

## 2022-08-07 ENCOUNTER — Telehealth: Payer: Self-pay | Admitting: Acute Care

## 2022-08-07 ENCOUNTER — Other Ambulatory Visit: Payer: Self-pay | Admitting: Internal Medicine

## 2022-08-07 DIAGNOSIS — Z1231 Encounter for screening mammogram for malignant neoplasm of breast: Secondary | ICD-10-CM

## 2022-08-07 NOTE — Telephone Encounter (Signed)
Received call report for LCS results.  Routed to Eric Form, NP  IMPRESSION: 1. Lung-RADS 4B, suspicious. Additional imaging evaluation or consultation with Pulmonology or Thoracic Surgery recommended. 2. Cardiac enlargement, new small bilateral pleural effusions, and diffuse subcutaneous edema concerning for anasarca. Correlate for any signs/symptoms of CHF. 3. Coronary artery calcifications. 4. Aortic Atherosclerosis (ICD10-I70.0) and Emphysema (ICD10-J43.9).   Electronically Signed   By: Kerby Moors M.D.   On: 08/07/2022 12:36

## 2022-08-08 ENCOUNTER — Telehealth: Payer: Self-pay | Admitting: Acute Care

## 2022-08-08 DIAGNOSIS — R911 Solitary pulmonary nodule: Secondary | ICD-10-CM

## 2022-08-08 NOTE — Telephone Encounter (Signed)
I have attempted to call the patient with the results of their  Low Dose CT Chest Lung cancer screening scan. There was no answer. I have left a HIPPA compliant VM requesting the patient call the office for the scan results. I included the office contact information in the message. We will await their  return call. If no return call we will continue to call until patient is contacted.    Carol Valenzuela, this is a 4 B. I will order a PET once we have contacted her.

## 2022-08-08 NOTE — Telephone Encounter (Signed)
See other telephone note from 08/08/22

## 2022-08-08 NOTE — Telephone Encounter (Signed)
I have called both the home number in the cell number and left a message with the office contact information on both.Carol Valenzuela

## 2022-08-08 NOTE — Telephone Encounter (Signed)
I have called the results of the low dose CT Chest.  There was an existing  nodule ( noted on a 2019 scan) that has grown to 1.4 cm. I have called the patient's sister Carol Valenzuela , who is her care giver, and we have discussed PET scan as the next step. The family is going to be reaching out to her PCP Dr. Shelia Media and her renal specialist , Dr. Raymondo Band,  to get your advice. I am going to order the PET scan ,which we recommend as next step,  to get Carol Valenzuela  scheduled as there can sometimes be a wait. If the family decides against the PET , we can cancel, and consider a 3 month follow up. I have ordered the PET. She will need a follow up appointment after the PET with Icard, Byrum or me.Thanks so much

## 2022-08-13 DIAGNOSIS — L84 Corns and callosities: Secondary | ICD-10-CM | POA: Diagnosis not present

## 2022-08-13 DIAGNOSIS — M79676 Pain in unspecified toe(s): Secondary | ICD-10-CM | POA: Diagnosis not present

## 2022-08-13 DIAGNOSIS — B351 Tinea unguium: Secondary | ICD-10-CM | POA: Diagnosis not present

## 2022-08-13 DIAGNOSIS — E1142 Type 2 diabetes mellitus with diabetic polyneuropathy: Secondary | ICD-10-CM | POA: Diagnosis not present

## 2022-08-14 DIAGNOSIS — N184 Chronic kidney disease, stage 4 (severe): Secondary | ICD-10-CM | POA: Diagnosis not present

## 2022-08-14 DIAGNOSIS — F17210 Nicotine dependence, cigarettes, uncomplicated: Secondary | ICD-10-CM | POA: Diagnosis not present

## 2022-08-14 DIAGNOSIS — M810 Age-related osteoporosis without current pathological fracture: Secondary | ICD-10-CM | POA: Diagnosis not present

## 2022-08-16 DIAGNOSIS — F339 Major depressive disorder, recurrent, unspecified: Secondary | ICD-10-CM | POA: Diagnosis not present

## 2022-08-16 DIAGNOSIS — E118 Type 2 diabetes mellitus with unspecified complications: Secondary | ICD-10-CM | POA: Diagnosis not present

## 2022-08-16 DIAGNOSIS — I10 Essential (primary) hypertension: Secondary | ICD-10-CM | POA: Diagnosis not present

## 2022-08-16 DIAGNOSIS — E785 Hyperlipidemia, unspecified: Secondary | ICD-10-CM | POA: Diagnosis not present

## 2022-08-17 DIAGNOSIS — E139 Other specified diabetes mellitus without complications: Secondary | ICD-10-CM | POA: Diagnosis not present

## 2022-08-17 DIAGNOSIS — E0590 Thyrotoxicosis, unspecified without thyrotoxic crisis or storm: Secondary | ICD-10-CM | POA: Diagnosis not present

## 2022-08-22 DIAGNOSIS — I10 Essential (primary) hypertension: Secondary | ICD-10-CM | POA: Diagnosis not present

## 2022-08-23 DIAGNOSIS — E042 Nontoxic multinodular goiter: Secondary | ICD-10-CM | POA: Diagnosis not present

## 2022-08-23 DIAGNOSIS — E0590 Thyrotoxicosis, unspecified without thyrotoxic crisis or storm: Secondary | ICD-10-CM | POA: Diagnosis not present

## 2022-08-23 DIAGNOSIS — E108 Type 1 diabetes mellitus with unspecified complications: Secondary | ICD-10-CM | POA: Diagnosis not present

## 2022-08-23 DIAGNOSIS — E139 Other specified diabetes mellitus without complications: Secondary | ICD-10-CM | POA: Diagnosis not present

## 2022-08-23 DIAGNOSIS — I10 Essential (primary) hypertension: Secondary | ICD-10-CM | POA: Diagnosis not present

## 2022-08-23 DIAGNOSIS — N184 Chronic kidney disease, stage 4 (severe): Secondary | ICD-10-CM | POA: Diagnosis not present

## 2022-08-23 DIAGNOSIS — I480 Paroxysmal atrial fibrillation: Secondary | ICD-10-CM | POA: Diagnosis not present

## 2022-08-31 ENCOUNTER — Ambulatory Visit (HOSPITAL_COMMUNITY)
Admission: RE | Admit: 2022-08-31 | Discharge: 2022-08-31 | Disposition: A | Discharge status: Home / Self Care | Payer: Medicare Other | Source: Ambulatory Visit | Attending: Acute Care | Admitting: Acute Care

## 2022-08-31 DIAGNOSIS — R911 Solitary pulmonary nodule: Secondary | ICD-10-CM

## 2022-08-31 LAB — GLUCOSE, CAPILLARY: Glucose-Capillary: 218 mg/dL — ABNORMAL HIGH (ref 70–99)

## 2022-08-31 MED ORDER — FLUDEOXYGLUCOSE F - 18 (FDG) INJECTION
7.4000 | Freq: Once | INTRAVENOUS | Status: AC
  Administered 2022-08-31: 7.4 via INTRAVENOUS

## 2022-09-03 DIAGNOSIS — N184 Chronic kidney disease, stage 4 (severe): Secondary | ICD-10-CM | POA: Diagnosis not present

## 2022-09-04 ENCOUNTER — Ambulatory Visit: Payer: Medicare Other | Admitting: Acute Care

## 2022-09-06 ENCOUNTER — Encounter: Payer: Self-pay | Admitting: Emergency Medicine

## 2022-09-06 ENCOUNTER — Ambulatory Visit (INDEPENDENT_AMBULATORY_CARE_PROVIDER_SITE_OTHER): Payer: Medicare Other | Admitting: Emergency Medicine

## 2022-09-06 VITALS — BP 142/78 | HR 57 | Temp 98.7°F | Ht 66.0 in | Wt 171.0 lb

## 2022-09-06 DIAGNOSIS — R911 Solitary pulmonary nodule: Secondary | ICD-10-CM

## 2022-09-06 DIAGNOSIS — M810 Age-related osteoporosis without current pathological fracture: Secondary | ICD-10-CM | POA: Diagnosis not present

## 2022-09-06 DIAGNOSIS — I2510 Atherosclerotic heart disease of native coronary artery without angina pectoris: Secondary | ICD-10-CM | POA: Diagnosis not present

## 2022-09-06 NOTE — Progress Notes (Signed)
Subjective:    Patient ID: Carol Valenzuela, female    DOB: 1951-03-27, 71 y.o.   MRN: [id number]  HPI 71 year old active smoker (45 pack years) with a history of hypertension, hyperlipidemia, diabetes, stage IV chronic kidney disease, dementia.  She participates in the lung cancer screening program and is referred today for evaluation of an abnormal CT chest and subsequent PET scan as below. She denies any SOB, is not on any BD. She has some occasional cough.    Lung cancer screening CT chest 08/06/2022 reviewed by me, compared to multiple priors.  This shows new small bilateral pleural effusions, changes consistent with respiratory bronchiolitis, some centrilobular emphysema, stable biapical pleural-parenchymal scarring and some scattered calcified and noncalcified pulmonary nodules.  In particular there is a left lower lobe pulmonary nodule adjacent to the left heart border approximately 13-14 mm.  This is slightly larger than previously noted on screening CT chest 10/16/2017 when it was 10 mm.  Also noted was some generalized edema consistent with anasarca.   PET scan 08/31/2022 reviewed by me shows 14 mm left lower lobe pulmonary nodule without significant FDG uptake or hypermetabolism.  There is no mediastinal or hilar adenopathy.  No distant metastatic disease noted   Review of Systems As per HPI  Past Medical History:  Diagnosis Date   Dementia ([place])    Diabetes ([place])    Hyperlipidemia    Hypertension    Hyperthyroidism    Stage 4 chronic kidney disease ([place])      Family History  Problem Relation Age of Onset   Brain cancer Father    Lung cancer Father    Liver cancer Father    Alcoholism Father    Breast cancer Neg Hx     Father lung CA  Social History   Socioeconomic History   Marital status: Single    Spouse name: Not on file   Number of children: 0   Years of education: Not on file   Highest education level: Not on file  Occupational History   Not on file   Tobacco Use   Smoking status: Every Day    Packs/day: 0.25    Years: 44.00    Total pack years: 11.00    Types: Cigarettes   Smokeless tobacco: Never   Tobacco comments:    0.5 packs of cigarettes smoked daily. 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     Ascorbic Acid (VITA-C PO) Take 1 tablet by mouth 3 (three) times a week.     atorvastatin (LIPITOR) 40 MG tablet TAKE 1 TABLET EVERY DAY 90 tablet 2   buPROPion (WELLBUTRIN XL) 150 MG 24 hr tablet Take 150  mg by mouth at bedtime.     Calcium Carbonate-Vitamin D 600-5 MG-MCG CAPS Take 1 tablet by mouth daily.     donepezil (ARICEPT) 5 MG tablet 10 mg. Patient takes 5 mg daily by mouth for 4 weeks then increase to 10 mg once daily.     ferrous sulfate (FERROUSUL) 325 (65 FE) MG tablet Take 325 mg by mouth 3 (three) times a week.     furosemide (LASIX) 40 MG tablet Take 0.5 tablets (20 mg total) by mouth daily. 90 tablet 1   hydrALAZINE (APRESOLINE) 100 MG tablet Take 1 tablet (100 mg total) by mouth 3 (three) times daily. 270 tablet 3   insulin aspart (NOVOLOG) 100 UNIT/ML FlexPen Inject 1-4 Units into the skin 3 (three) times daily with meals. Sliding scale Below 150) 151-200 -1 units 201-250=2 units 251-300=3 units 301-400=4 units     insulin  degludec (TRESIBA FLEXTOUCH) 100 UNIT/ML FlexTouch Pen Inject 14 Units into the skin every morning.     isosorbide dinitrate (ISORDIL) 30 MG tablet Take 2 tablets (60 mg total) by mouth three times daily. 180 tablet 3   labetalol (NORMODYNE) 100 MG tablet Take 100 mg by mouth 2 (two) times daily.     methimazole (TAPAZOLE) 5 MG tablet Take 5 mg by mouth daily.     ondansetron (ZOFRAN-ODT) 8 MG disintegrating tablet Take 1 tablet (8 mg total) by mouth every 8 (eight) hours as needed for nausea or vomiting. (Patient not taking: Reported on 09/06/2022) 20 tablet 0   VITAMIN D PO Take 2,000 Units by mouth daily in the afternoon. (Patient not taking: Reported on 09/06/2022)     No facility-administered medications prior to visit.        Objective:   Physical Exam Vitals:   09/06/22 1518  BP: (!) 142/78  Pulse: (!) 57  Temp: 98.7 F (37.1 C)  TempSrc: Oral  SpO2: 97%  Weight: 171 lb (77.6 kg)  Height: 5\' 6"  (1.676 m)   Gen: Pleasant, well-nourished, in no distress,  normal affect  ENT: No lesions,  mouth clear,  oropharynx clear, no postnasal drip  Neck: No JVD, no stridor  Lungs: No use of accessory muscles, no crackles or wheezing on normal respiration, no wheeze on forced expiration  Cardiovascular: RRR, heart sounds normal, no murmur or gallops, no peripheral edema  Musculoskeletal: No deformities, no cyanosis or clubbing  Neuro: alert, awake, forgetful but redirectable.  Moves all extremities.  Nonfocal  Skin: Warm, no lesions or rash      Assessment & Plan:  Pulmonary nodule 1 cm or greater in diameter Left lower lobe pulmonary nodule that may be slowly enlarging when reviewing serial scans going back as far as 2019.  PET scan does not show any hypermetabolism which is reassuring.  I did explain to them that a slow-growing adenocarcinoma may not be hypermetabolic.  At this time suspicion for an adenocarcinoma is at least moderate given her tobacco history and her family  history of malignancy.  We talked about options for follow-up, possible bronchoscopy with biopsies.  At this time we will plan to perform a super D CT in 6 months to continue to follow for interval change.  If suspicion merits then we will talk about possibly setting up navigational bronchoscopy for biopsies.   Baltazar Apo, MD, PhD 09/06/2022, 4:51 PM [facility] [phone] or if no answer before 7:00PM call [phone] For any issues after 7:00PM please call eLink [phone]

## 2022-09-06 NOTE — Assessment & Plan Note (Signed)
Left lower lobe pulmonary nodule that may be slowly enlarging when reviewing serial scans going back as far as 2019.  PET scan does not show any hypermetabolism which is reassuring.  I did explain to them that a slow-growing adenocarcinoma may not be hypermetabolic.  At this time suspicion for an adenocarcinoma is at least moderate given her tobacco history and her family history of malignancy.  We talked about options for follow-up, possible bronchoscopy with biopsies.  At this time we will plan to perform a super D CT in 6 months to continue to follow for interval change.  If suspicion merits then we will talk about possibly setting up navigational bronchoscopy for biopsies.

## 2022-09-06 NOTE — Patient Instructions (Signed)
We reviewed your CT scans of the chest and your PET scan today. We will repeat a CT scan of your chest in June 2024 to follow a small left lower lobe pulmonary nodule. Follow Dr. Lamonte Sakai in June after that scan so we can review.  Depending on those results we will decide whether any other workup would be helpful including possible bronchoscopy and biopsies. Will hold off on starting any inhaler medication at this time.

## 2022-09-12 ENCOUNTER — Telehealth: Payer: Self-pay

## 2022-09-12 NOTE — Telephone Encounter (Signed)
I spoke with her sister concerning the patient legs swelling. She has spoken with her kidney specialist and she is handling the issue.

## 2022-09-22 ENCOUNTER — Encounter (HOSPITAL_COMMUNITY): Payer: Self-pay

## 2022-09-22 ENCOUNTER — Emergency Department (HOSPITAL_COMMUNITY): Payer: Medicare Other

## 2022-09-22 ENCOUNTER — Other Ambulatory Visit: Payer: Self-pay

## 2022-09-22 ENCOUNTER — Inpatient Hospital Stay (HOSPITAL_COMMUNITY)
Admission: EM | Admit: 2022-09-22 | Discharge: 2022-09-26 | DRG: 291 | Disposition: A | Discharge status: Home Health | Payer: Medicare Other | Attending: Internal Medicine | Admitting: Internal Medicine

## 2022-09-22 DIAGNOSIS — I509 Heart failure, unspecified: Principal | ICD-10-CM

## 2022-09-22 DIAGNOSIS — Z6372 Alcoholism and drug addiction in family: Secondary | ICD-10-CM

## 2022-09-22 DIAGNOSIS — J1282 Pneumonia due to coronavirus disease 2019: Secondary | ICD-10-CM | POA: Diagnosis not present

## 2022-09-22 DIAGNOSIS — F0390 Unspecified dementia without behavioral disturbance: Secondary | ICD-10-CM | POA: Diagnosis present

## 2022-09-22 DIAGNOSIS — N184 Chronic kidney disease, stage 4 (severe): Secondary | ICD-10-CM | POA: Diagnosis not present

## 2022-09-22 DIAGNOSIS — I272 Pulmonary hypertension, unspecified: Secondary | ICD-10-CM | POA: Diagnosis present

## 2022-09-22 DIAGNOSIS — Z7901 Long term (current) use of anticoagulants: Secondary | ICD-10-CM | POA: Diagnosis not present

## 2022-09-22 DIAGNOSIS — Z800 Family history of malignant neoplasm of digestive organs: Secondary | ICD-10-CM

## 2022-09-22 DIAGNOSIS — Z79899 Other long term (current) drug therapy: Secondary | ICD-10-CM | POA: Diagnosis not present

## 2022-09-22 DIAGNOSIS — U071 COVID-19: Secondary | ICD-10-CM | POA: Diagnosis not present

## 2022-09-22 DIAGNOSIS — F17210 Nicotine dependence, cigarettes, uncomplicated: Secondary | ICD-10-CM | POA: Diagnosis present

## 2022-09-22 DIAGNOSIS — I5032 Chronic diastolic (congestive) heart failure: Secondary | ICD-10-CM | POA: Diagnosis present

## 2022-09-22 DIAGNOSIS — Z801 Family history of malignant neoplasm of trachea, bronchus and lung: Secondary | ICD-10-CM | POA: Diagnosis not present

## 2022-09-22 DIAGNOSIS — Z808 Family history of malignant neoplasm of other organs or systems: Secondary | ICD-10-CM | POA: Diagnosis not present

## 2022-09-22 DIAGNOSIS — I5033 Acute on chronic diastolic (congestive) heart failure: Secondary | ICD-10-CM | POA: Diagnosis present

## 2022-09-22 DIAGNOSIS — I10 Essential (primary) hypertension: Secondary | ICD-10-CM | POA: Diagnosis present

## 2022-09-22 DIAGNOSIS — Z794 Long term (current) use of insulin: Secondary | ICD-10-CM

## 2022-09-22 DIAGNOSIS — E785 Hyperlipidemia, unspecified: Secondary | ICD-10-CM | POA: Diagnosis present

## 2022-09-22 DIAGNOSIS — I110 Hypertensive heart disease with heart failure: Secondary | ICD-10-CM | POA: Diagnosis not present

## 2022-09-22 DIAGNOSIS — I480 Paroxysmal atrial fibrillation: Secondary | ICD-10-CM | POA: Diagnosis present

## 2022-09-22 DIAGNOSIS — I130 Hypertensive heart and chronic kidney disease with heart failure and stage 1 through stage 4 chronic kidney disease, or unspecified chronic kidney disease: Secondary | ICD-10-CM | POA: Diagnosis not present

## 2022-09-22 DIAGNOSIS — J90 Pleural effusion, not elsewhere classified: Secondary | ICD-10-CM | POA: Diagnosis not present

## 2022-09-22 DIAGNOSIS — E1322 Other specified diabetes mellitus with diabetic chronic kidney disease: Secondary | ICD-10-CM

## 2022-09-22 DIAGNOSIS — R062 Wheezing: Secondary | ICD-10-CM | POA: Diagnosis not present

## 2022-09-22 DIAGNOSIS — E119 Type 2 diabetes mellitus without complications: Secondary | ICD-10-CM

## 2022-09-22 DIAGNOSIS — E1122 Type 2 diabetes mellitus with diabetic chronic kidney disease: Secondary | ICD-10-CM | POA: Diagnosis not present

## 2022-09-22 DIAGNOSIS — E1165 Type 2 diabetes mellitus with hyperglycemia: Secondary | ICD-10-CM | POA: Diagnosis present

## 2022-09-22 LAB — CBC
HCT: 26.3 % — ABNORMAL LOW (ref 36.0–46.0)
Hemoglobin: 8.6 g/dL — ABNORMAL LOW (ref 12.0–15.0)
MCH: 31.3 pg (ref 26.0–34.0)
MCHC: 32.7 g/dL (ref 30.0–36.0)
MCV: 95.6 fL (ref 80.0–100.0)
Platelets: 170 10*3/uL (ref 150–400)
RBC: 2.75 MIL/uL — ABNORMAL LOW (ref 3.87–5.11)
RDW: 13.2 % (ref 11.5–15.5)
WBC: 3.9 10*3/uL — ABNORMAL LOW (ref 4.0–10.5)
nRBC: 0 % (ref 0.0–0.2)

## 2022-09-22 LAB — BASIC METABOLIC PANEL
Anion gap: 8 (ref 5–15)
BUN: 46 mg/dL — ABNORMAL HIGH (ref 8–23)
CO2: 21 mmol/L — ABNORMAL LOW (ref 22–32)
Calcium: 7.6 mg/dL — ABNORMAL LOW (ref 8.9–10.3)
Chloride: 103 mmol/L (ref 98–111)
Creatinine, Ser: 2.26 mg/dL — ABNORMAL HIGH (ref 0.44–1.00)
GFR, Estimated: 22 mL/min — ABNORMAL LOW (ref >60)
Glucose, Bld: 166 mg/dL — ABNORMAL HIGH (ref 70–99)
Potassium: 4 mmol/L (ref 3.5–5.1)
Sodium: 132 mmol/L — ABNORMAL LOW (ref 135–145)

## 2022-09-22 LAB — RESP PANEL BY RT-PCR (RSV, FLU A&B, COVID)  RVPGX2
Influenza A by PCR: NEGATIVE
Influenza B by PCR: NEGATIVE
Resp Syncytial Virus by PCR: NEGATIVE
SARS Coronavirus 2 by RT PCR: POSITIVE — AB

## 2022-09-22 LAB — LACTATE DEHYDROGENASE: LDH: 203 U/L — ABNORMAL HIGH (ref 98–192)

## 2022-09-22 LAB — FERRITIN: Ferritin: 98 ng/mL (ref 11–307)

## 2022-09-22 LAB — PROCALCITONIN: Procalcitonin: <0.1 ng/mL

## 2022-09-22 LAB — MAGNESIUM: Magnesium: 2 mg/dL (ref 1.7–2.4)

## 2022-09-22 LAB — BRAIN NATRIURETIC PEPTIDE: B Natriuretic Peptide: 611 pg/mL — ABNORMAL HIGH (ref 0.0–100.0)

## 2022-09-22 LAB — D-DIMER, QUANTITATIVE: D-Dimer, Quant: 0.65 ug/mL-FEU — ABNORMAL HIGH (ref 0.00–0.50)

## 2022-09-22 LAB — GLUCOSE, CAPILLARY: Glucose-Capillary: 173 mg/dL — ABNORMAL HIGH (ref 70–99)

## 2022-09-22 MED ORDER — INSULIN ASPART 100 UNIT/ML IJ SOLN
0.0000 [IU] | Freq: Three times a day (TID) | INTRAMUSCULAR | Status: DC
  Administered 2022-09-23: 5 [IU] via SUBCUTANEOUS
  Administered 2022-09-23: 7 [IU] via SUBCUTANEOUS
  Administered 2022-09-23: 3 [IU] via SUBCUTANEOUS
  Administered 2022-09-24: 9 [IU] via SUBCUTANEOUS
  Administered 2022-09-24: 7 [IU] via SUBCUTANEOUS

## 2022-09-22 MED ORDER — HYDRALAZINE HCL 20 MG/ML IJ SOLN
10.0000 mg | INTRAMUSCULAR | Status: DC | PRN
  Administered 2022-09-22: 10 mg via INTRAVENOUS
  Filled 2022-09-22: qty 1

## 2022-09-22 MED ORDER — ISOSORBIDE DINITRATE 20 MG PO TABS
60.0000 mg | ORAL_TABLET | Freq: Three times a day (TID) | ORAL | Status: DC
  Administered 2022-09-22 – 2022-09-25 (×10): 60 mg via ORAL
  Filled 2022-09-22 (×10): qty 3

## 2022-09-22 MED ORDER — METHYLPREDNISOLONE SODIUM SUCC 40 MG IJ SOLR
40.0000 mg | Freq: Two times a day (BID) | INTRAMUSCULAR | Status: DC
  Administered 2022-09-22 – 2022-09-24 (×4): 40 mg via INTRAVENOUS
  Filled 2022-09-22 (×4): qty 1

## 2022-09-22 MED ORDER — POTASSIUM CHLORIDE CRYS ER 20 MEQ PO TBCR
40.0000 meq | EXTENDED_RELEASE_TABLET | Freq: Once | ORAL | Status: AC
  Administered 2022-09-22: 40 meq via ORAL
  Filled 2022-09-22: qty 2

## 2022-09-22 MED ORDER — FUROSEMIDE 10 MG/ML IJ SOLN
40.0000 mg | Freq: Once | INTRAMUSCULAR | Status: AC
  Administered 2022-09-22: 40 mg via INTRAVENOUS
  Filled 2022-09-22: qty 4

## 2022-09-22 MED ORDER — POLYETHYLENE GLYCOL 3350 17 G PO PACK: 17.0000 g | PACK | Freq: Every day | ORAL | Status: DC | PRN

## 2022-09-22 MED ORDER — AZITHROMYCIN 250 MG PO TABS
500.0000 mg | ORAL_TABLET | Freq: Once | ORAL | Status: AC
  Administered 2022-09-22: 500 mg via ORAL
  Filled 2022-09-22: qty 2

## 2022-09-22 MED ORDER — ENOXAPARIN SODIUM 30 MG/0.3ML IJ SOSY: 30.0000 mg | PREFILLED_SYRINGE | INTRAMUSCULAR | Status: DC

## 2022-09-22 MED ORDER — ONDANSETRON HCL 4 MG PO TABS: 4.0000 mg | ORAL_TABLET | Freq: Four times a day (QID) | ORAL | Status: DC | PRN

## 2022-09-22 MED ORDER — ACETAMINOPHEN 325 MG PO TABS: 650.0000 mg | ORAL_TABLET | Freq: Four times a day (QID) | ORAL | Status: DC | PRN

## 2022-09-22 MED ORDER — PREDNISONE 20 MG PO TABS: 50.0000 mg | ORAL_TABLET | Freq: Every day | ORAL | Status: DC

## 2022-09-22 MED ORDER — ONDANSETRON HCL 4 MG/2ML IJ SOLN: 4.0000 mg | Freq: Four times a day (QID) | INTRAMUSCULAR | Status: DC | PRN

## 2022-09-22 MED ORDER — INSULIN GLARGINE-YFGN 100 UNIT/ML ~~LOC~~ SOLN
10.0000 [IU] | SUBCUTANEOUS | Status: DC
  Administered 2022-09-23: 10 [IU] via SUBCUTANEOUS
  Filled 2022-09-22 (×2): qty 0.1

## 2022-09-22 MED ORDER — SODIUM CHLORIDE 0.9 % IV SOLN
1.0000 g | Freq: Once | INTRAVENOUS | Status: AC
  Administered 2022-09-22: 1 g via INTRAVENOUS
  Filled 2022-09-22: qty 10

## 2022-09-22 MED ORDER — ATORVASTATIN CALCIUM 40 MG PO TABS
40.0000 mg | ORAL_TABLET | Freq: Every day | ORAL | Status: DC
  Filled 2022-09-22: qty 1

## 2022-09-22 MED ORDER — ACETAMINOPHEN 650 MG RE SUPP: 650.0000 mg | Freq: Four times a day (QID) | RECTAL | Status: DC | PRN

## 2022-09-22 MED ORDER — DONEPEZIL HCL 5 MG PO TABS
10.0000 mg | ORAL_TABLET | Freq: Every day | ORAL | Status: DC
  Administered 2022-09-22 – 2022-09-25 (×4): 10 mg via ORAL
  Filled 2022-09-22 (×4): qty 2

## 2022-09-22 MED ORDER — APIXABAN 5 MG PO TABS
5.0000 mg | ORAL_TABLET | Freq: Two times a day (BID) | ORAL | Status: DC
  Administered 2022-09-22 – 2022-09-26 (×8): 5 mg via ORAL
  Filled 2022-09-22 (×8): qty 1

## 2022-09-22 MED ORDER — FUROSEMIDE 10 MG/ML IJ SOLN
40.0000 mg | Freq: Two times a day (BID) | INTRAMUSCULAR | Status: DC
  Administered 2022-09-23 – 2022-09-25 (×5): 40 mg via INTRAVENOUS
  Filled 2022-09-22 (×5): qty 4

## 2022-09-22 MED ORDER — HYDRALAZINE HCL 25 MG PO TABS
100.0000 mg | ORAL_TABLET | Freq: Three times a day (TID) | ORAL | Status: DC
  Administered 2022-09-22 – 2022-09-26 (×11): 100 mg via ORAL
  Filled 2022-09-22: qty 4
  Filled 2022-09-22: qty 2
  Filled 2022-09-22 (×3): qty 4
  Filled 2022-09-22: qty 2
  Filled 2022-09-22: qty 4
  Filled 2022-09-22: qty 2
  Filled 2022-09-22: qty 4
  Filled 2022-09-22: qty 2
  Filled 2022-09-22 (×4): qty 4
  Filled 2022-09-22: qty 2
  Filled 2022-09-22: qty 4

## 2022-09-22 MED ORDER — INSULIN ASPART 100 UNIT/ML IJ SOLN
0.0000 [IU] | Freq: Every day | INTRAMUSCULAR | Status: DC
  Administered 2022-09-23: 5 [IU] via SUBCUTANEOUS

## 2022-09-22 NOTE — ED Provider Notes (Signed)
[facility] EMERGENCY DEPARTMENT Provider Note   CSN: [id number] Arrival date & time: 09/22/22  1200     History  Chief Complaint  Patient presents with   Leg Swelling    Carol Valenzuela is a 72 y.o. female.  HPI      Carol Valenzuela is a 72 y.o. female with past medical history of hypertension, type 2 diabetes, paroxysmal atrial fibrillation, pulmonary hypertension, dementia and stage IV CKD who presents to the Emergency Department complaining of bilateral leg swelling since 09/06/2022.  Has chronically been on 20 mg Lasix.  Her Lasix dose has been intermittently increased recently with limited improvement of her edema.  Patient sister states yesterday she seemed "tired" not as active as usual.  This morning, when her sister tried to get her up she was "weak all over" unable to stand without assistance and required wheelchair to get from room to room.  She is typically self-sufficient and does not require assistance with ambulation at baseline.  Sister denied any facial weakness, unilateral weakness or changes in speech or level of cognition.   Patient sister at bedside provides most of the history    Home Medications Prior to Admission medications   Medication Sig Start Date End Date Taking? Authorizing Provider  apixaban (ELIQUIS) 5 MG TABS tablet Take 5 mg by mouth 2 (two) times daily.    [provider]  Ascorbic Acid (VITA-C PO) Take 1 tablet by mouth 3 (three) times a week.    [provider]  atorvastatin (LIPITOR) 40 MG tablet TAKE 1 TABLET EVERY DAY 02/23/22   Patwardhan, Manish J, MD  buPROPion (WELLBUTRIN XL) 150 MG 24 hr tablet Take 150 mg by mouth at bedtime. 06/20/20   [provider]  Calcium Carbonate-Vitamin D 600-5 MG-MCG CAPS Take 1 tablet by mouth daily.    [provider]  donepezil (ARICEPT) 5 MG tablet 10 mg. Patient takes 5 mg daily by mouth for 4 weeks then increase to 10 mg once daily.    [provider]  ferrous  sulfate (FERROUSUL) 325 (65 FE) MG tablet Take 325 mg by mouth 3 (three) times a week.    [provider]  furosemide (LASIX) 40 MG tablet Take 0.5 tablets (20 mg total) by mouth daily. 05/02/22   Larey Dresser, MD  hydrALAZINE (APRESOLINE) 100 MG tablet Take 1 tablet (100 mg total) by mouth 3 (three) times daily. 04/25/22   Larey Dresser, MD  insulin aspart (NOVOLOG) 100 UNIT/ML FlexPen Inject 1-4 Units into the skin 3 (three) times daily with meals. Sliding scale Below 150) 151-200 -1 units 201-250=2 units 251-300=3 units 301-400=4 units    [provider]  insulin degludec (TRESIBA FLEXTOUCH) 100 UNIT/ML FlexTouch Pen Inject 14 Units into the skin every morning.    [provider]  isosorbide dinitrate (ISORDIL) 30 MG tablet Take 2 tablets (60 mg total) by mouth three times daily. 07/27/22   Patwardhan, Reynold Bowen, MD  labetalol (NORMODYNE) 100 MG tablet Take 100 mg by mouth 2 (two) times daily.    [provider]  methimazole (TAPAZOLE) 5 MG tablet Take 5 mg by mouth daily.    [provider]  ondansetron (ZOFRAN-ODT) 8 MG disintegrating tablet Take 1 tablet (8 mg total) by mouth every 8 (eight) hours as needed for nausea or vomiting. Patient not taking: Reported on 78/29/5621 30/86/57   Delora Fuel, MD  VITAMIN D PO Take 2,000 Units by mouth daily in the afternoon. Patient  not taking: Reported on 09/06/2022    [provider]      Allergies    Patient has no known allergies.    Review of Systems   Review of Systems  Constitutional:  Negative for chills and fever.  Respiratory:  Positive for cough. Negative for chest tightness and shortness of breath.   Cardiovascular:  Negative for chest pain.  Gastrointestinal:  Negative for abdominal pain, diarrhea and vomiting.  Neurological:  Negative for dizziness, syncope, weakness and numbness.    Physical Exam Updated Vital Signs BP (!) 170/79   Pulse 70   Temp 98.6 F (37 C)  (Oral)   Resp 19   Ht 5\' 6"  (1.676 m)   Wt 77.6 kg   LMP  (LMP Unknown)   SpO2 94%   BMI 27.60 kg/m  Physical Exam Vitals and nursing note reviewed.  Constitutional:      General: She is not in acute distress.    Appearance: Normal appearance. She is not toxic-appearing.  HENT:     Mouth/Throat:     Mouth: Mucous membranes are moist.  Eyes:     Extraocular Movements: Extraocular movements intact.     Conjunctiva/sclera: Conjunctivae normal.     Pupils: Pupils are equal, round, and reactive to light.  Cardiovascular:     Rate and Rhythm: Normal rate and regular rhythm.     Pulses: Normal pulses.  Pulmonary:     Effort: Pulmonary effort is normal.     Breath sounds: Rhonchi present.     Comments: Coarse lung sounds throughout, no increased work of breathing Abdominal:     Palpations: Abdomen is soft.     Tenderness: There is no abdominal tenderness.  Musculoskeletal:     Cervical back: Normal range of motion.     Right lower leg: Edema present.     Left lower leg: Edema present.  Lymphadenopathy:     Cervical: No cervical adenopathy.  Skin:    General: Skin is warm.     Capillary Refill: Capillary refill takes less than 2 seconds.     Findings: No erythema or rash.  Neurological:     General: No focal deficit present.     Mental Status: She is alert.     GCS: GCS eye subscore is 4. GCS verbal subscore is 5. GCS motor subscore is 6.     Sensory: Sensation is intact. No sensory deficit.     Motor: Motor function is intact. No weakness.     Comments: CN II-XII intact.  Speech clear, no facial droop, no pronator drift oriented to person and place     ED Results / Procedures / Treatments   Labs (all labs ordered are listed, but only abnormal results are displayed) Labs Reviewed  RESP PANEL BY RT-PCR (RSV, FLU A&B, COVID)  RVPGX2 - Abnormal; Notable for the following components:      Result Value   SARS Coronavirus 2 by RT PCR POSITIVE (*)    All other components  within normal limits  BASIC METABOLIC PANEL - Abnormal; Notable for the following components:   Sodium 132 (*)    CO2 21 (*)    Glucose, Bld 166 (*)    BUN 46 (*)    Creatinine, Ser 2.26 (*)    Calcium 7.6 (*)    GFR, Estimated 22 (*)    All other components within normal limits  CBC - Abnormal; Notable for the following components:   WBC 3.9 (*)  RBC 2.75 (*)    Hemoglobin 8.6 (*)    HCT 26.3 (*)    All other components within normal limits  BRAIN NATRIURETIC PEPTIDE - Abnormal; Notable for the following components:   B Natriuretic Peptide 611.0 (*)    All other components within normal limits  MAGNESIUM    EKG None  Radiology DG Chest Port 1 View  Result Date: 09/22/2022 CLINICAL DATA:  Wheezing. EXAM: PORTABLE CHEST 1 VIEW COMPARISON:  CT 08/06/2022. Radiographs 06/26/2022. PET-CT 08/31/2022. FINDINGS: 1333 hours. The heart is enlarged, but stable. Aortic atherosclerosis noted. There is increased vascular congestion with new patchy left lower lobe airspace disease and a small left pleural effusion. No evidence of pneumothorax. No acute osseous findings are evident. Telemetry leads overlie the chest. IMPRESSION: Cardiomegaly with new vascular congestion and patchy left lower lobe airspace disease suspicious for pneumonia. Small left pleural effusion. Electronically Signed   By: Richardean Sale M.D.   On: 09/22/2022 13:47    Procedures Procedures    Medications Ordered in ED Medications  cefTRIAXone (ROCEPHIN) 1 g in sodium chloride 0.9 % 100 mL IVPB (1 g Intravenous New Bag/Given 09/22/22 1727)  azithromycin (ZITHROMAX) tablet 500 mg (has no administration in time range)  furosemide (LASIX) injection 40 mg (40 mg Intravenous Given 09/22/22 1513)  potassium chloride SA (KLOR-CON M) CR tablet 40 mEq (40 mEq Oral Given 09/22/22 1512)    ED Course/ Medical Decision Making/ A&P                           Medical Decision Making Patient here from home accompanied by her sister who  provides most of the history as patient has some dementia.  Patient reports cough but denies other symptoms.  Sister states she is gradually increasing swelling to both legs and cough.  Has recently had increased dose of her Lasix without significant improvement.  This morning, sister notes that she was very weak and unable to stand without assistance.  This is abnormal for her sister had to use wheelchair to get her from room to room this morning as she was unable to stand or walk on her own.  Sister did not appreciate any confusion, facial weakness or unilateral extremity weakness.  On my exam, patient is oriented to person and place.  I do not appreciate any focal neurodeficits.  Lung sounds are coarse with scattered rhonchi.  Does not have any increased work of breathing and she is not hypoxic here.  Afebrile.  Differential would include but not limited to CHF exacerbation, pneumonia, PE, viral process  Amount and/or Complexity of Data Reviewed Labs: ordered.    Details: Labs interpreted by me, no significant leukocytosis, hemoglobin 8.6, near baseline.  Chemistries show chronic kidney disease with creatinine 2.26 also near baseline.  Hypocalcemia with calcium of 7.6.  8.4 two months ago, magnesium unremarkable.  BNP 611.  And respiratory panel positive for COVID. Radiology: ordered.    Details: Chest x-ray shows cardiomegaly with new vascular congestion and patchy left lower lobe airspace disease suspicious for pneumonia small pleural effusion Discussion of management or test interpretation with external provider(s): On recheck, patient reports breathing improved.  Diuresed with approximately 900 cc output.  Given pt's generalized weakness, Covid+ and possible PNA vs fluid collection I feel that pt would benefit from hospital admission.  She and sister are agreeable.  Given azithromycin and rocephin here.  Likely out of window for Paxlovid treatment.   Risk  Prescription drug  management.           Final Clinical Impression(s) / ED Diagnoses Final diagnoses:  Acute on chronic congestive heart failure, unspecified heart failure type [facility])  COVID    Rx / DC Orders ED Discharge Orders     None         Kem Parkinson, PA-C 09/22/22 1749    Godfrey Pick, MD 09/26/22 1143

## 2022-09-22 NOTE — ED Triage Notes (Signed)
Pt sister reports increased leg swelling since 12/21.  Reports cardiologist increased lasix for a week and it improved but since decreasing lasix swelling has returned and pt is having trouble walking.

## 2022-09-22 NOTE — Assessment & Plan Note (Signed)
-   HgbA1c -Resume Tresiba at reduced dose 10 units daily(14u home dose). - SSI- S

## 2022-09-22 NOTE — Assessment & Plan Note (Addendum)
Rate controlled and on anticoagulation with Eliquis.   -Resume Eliquis, labetalol

## 2022-09-22 NOTE — Assessment & Plan Note (Addendum)
Hypertensive -Increasing labetalol 200 mg p.o. twice daily  -Resume hydralazine, labetalol, Imdur -As needed hydralazine for systolic greater than 768

## 2022-09-22 NOTE — ED Notes (Signed)
Pt ambulated in the hallway with less than 25% of assistance. Pt was abe to walk freely in the hallway to the nursing desk. Pt stated she felt a little shaking from sitting all day and would grab on objects at times. Pt was able to walk without holding anything at times to. Pt O2 stats was 93% during the ambulation.

## 2022-09-22 NOTE — Assessment & Plan Note (Addendum)
Creatinine 2.26.  At baseline. -Monitor with diuresis Lab Results  Component Value Date   CREATININE 2.83 (H) 09/25/2022   CREATININE 2.53 (H) 09/24/2022   CREATININE 2.30 (H) 09/23/2022

## 2022-09-22 NOTE — ED Notes (Signed)
Pt attempted a urine sample in triage, but missed the hat to catch the specimen.  Pt was able to walk some and pivot but was slightly unsteady and unbalanced.

## 2022-09-22 NOTE — Assessment & Plan Note (Addendum)
-  No respiratory distress, satting 96% on room air Chest x-ray showing ?  left lower lobe pneumonia.   - COVID test positive.  Received all COVID vaccines.   -Patient's Sister Teryl Lucy is HCPOA, she has declined Paxlovid  -ProCal negative, but holding antibiotics  -Was on IV Solu-Medrol, will discontinue steroids-due to persistent hyperglycemia, Aside from cough no further acute respiratory distress

## 2022-09-22 NOTE — Assessment & Plan Note (Addendum)
-  Still having shortness of breath with exertion -Currently satting 96% on room air -Extensive lower extremity edema -mild improvement -Poorly, not diuresing well  baseline weight is around 140s, POA: she is weighing 170 today.    Intake/Output Summary (Last 24 hours) at 09/24/2022 1328 Last data filed at 09/24/2022 0645 Gross per 24 hour  Intake 840 ml  Output 1100 ml  Net -260 ml   Filed Weights   09/22/22 1823 09/23/22 0500 09/24/22 0500  Weight: 79.3 kg 78.8 kg 77.9 kg      Chest x-ray - new vascular congestion and patchy left lower lobe airspace disease suspicious for pneumonia. Small left pleural effusion.  -  BNP Elevated 611. -Continue IV Lasix 40 twice daily >> increasing to 60 mg  -Monitor renal function closely -Strict input-Output, daily weights, daily BMP

## 2022-09-22 NOTE — Assessment & Plan Note (Addendum)
Mental status at baseline.  Pleasantly confused -She is able to communicate, but cannot recall chronic medical issues -Difficulty with ambulation and ADLs now  - She ambulates at baseline without assistance or assistive devices.   -Consulted PT OT --recommending home health

## 2022-09-22 NOTE — H&P (Signed)
History and Physical    Carol Valenzuela JQZ:[id number] DOB: January 09, 1951 DOA: 09/22/2022  PCP: Carol Pretty, MD   Patient coming from: Home  I have personally briefly reviewed patient's old medical records in [place]  Chief Complaint: Leg swelling  HPI: Carol Valenzuela is a 72 y.o. female with medical history significant for atrial fibrillation, hypertension, diabetes mellitus, CKD 3, Dementia. Patient has dementia, she is able to answer simple questions appropriately, but details are provided by her Carol Valenzuela who is at bedside. Patient has had ongoing leg swelling since 12/21, Lasix dose was doubled for 3 days on the 21st, and then for 1 week on the 26th.  Subsequently she has been on 20 mg of Lasix, and subsequently leg swelling worsened.  She has had a cough over the past several days.  No difficulty breathing.  She has also had steady weight gain since November. Baseline she is able to walk without assistance or assistive devices, but due to leg swelling she has not been able to ambulate well. She is updated all her vaccines this year- including COVID, RSV, influenza.  ED Course: Sats >94% on room air.  Tmax 99.  Heart rate 60s to 70s.  Respiratory rate 17-27.  Chest x-ray shows cardiomegaly, vascular congestion, left lower lobe airspace disease suspicious for pneumonia. COVID test positive. BNP elevated 611. IV Lasix 40 mg x 1 given.  Hospitalist to admit for decompensated CHF.  Review of Systems: As per HPI all other systems reviewed and negative.  Past Medical History:  Diagnosis Date   Dementia ([place])    Diabetes ([place])    Hyperlipidemia    Hypertension    Hyperthyroidism    Stage 4 chronic kidney disease ([place])     Past Surgical History:  Procedure Laterality Date   ankle sx Left    broken ankle repair   RIGHT HEART CATH N/A 05/02/2022   Procedure: RIGHT HEART CATH;  Surgeon: Carol Dresser, MD;  Location: [facility];  Service: Cardiovascular;   Laterality: N/A;     reports that she has been smoking cigarettes. She has a 11.00 pack-year smoking history. She has never used smokeless tobacco. She reports that she does not currently use alcohol. She reports that she does not currently use drugs.  No Known Allergies  Family History  Problem Relation Age of Onset   Brain cancer Father    Lung cancer Father    Liver cancer Father    Alcoholism Father    Breast cancer Neg Hx     Prior to Admission medications   Medication Sig Start Date End Date Taking? Authorizing Provider  apixaban (ELIQUIS) 5 MG TABS tablet Take 5 mg by mouth 2 (two) times daily.    [provider]  Ascorbic Acid (VITA-C PO) Take 1 tablet by mouth 3 (three) times a week.    [provider]  atorvastatin (LIPITOR) 40 MG tablet TAKE 1 TABLET EVERY DAY 02/23/22   Carol Valenzuela, Carol J, MD  buPROPion (WELLBUTRIN XL) 150 MG 24 hr tablet Take 150 mg by mouth at bedtime. 06/20/20   [provider]  Calcium Carbonate-Vitamin D 600-5 MG-MCG CAPS Take 1 tablet by mouth daily.    [provider]  donepezil (ARICEPT) 5 MG tablet 10 mg. Patient takes 5 mg daily by mouth for 4 weeks then increase to 10 mg once daily.    [provider]  ferrous sulfate (FERROUSUL) 325 (65 FE) MG tablet Take 325 mg  by mouth 3 (three) times a week.    [provider]  furosemide (LASIX) 40 MG tablet Take 0.5 tablets (20 mg total) by mouth daily. 05/02/22   Carol Dresser, MD  hydrALAZINE (APRESOLINE) 100 MG tablet Take 1 tablet (100 mg total) by mouth 3 (three) times daily. 04/25/22   Carol Dresser, MD  insulin aspart (NOVOLOG) 100 UNIT/ML FlexPen Inject 1-4 Units into the skin 3 (three) times daily with meals. Sliding scale Below 150) 151-200 -1 units 201-250=2 units 251-300=3 units 301-400=4 units    [provider]  insulin degludec (TRESIBA FLEXTOUCH) 100 UNIT/ML FlexTouch Pen Inject 14 Units into the skin every morning.     [provider]  isosorbide dinitrate (ISORDIL) 30 MG tablet Take 2 tablets (60 mg total) by mouth three times daily. 07/27/22   Carol Valenzuela, Reynold Bowen, MD  labetalol (NORMODYNE) 100 MG tablet Take 100 mg by mouth 2 (two) times daily.    [provider]  methimazole (TAPAZOLE) 5 MG tablet Take 5 mg by mouth daily.    [provider]  ondansetron (ZOFRAN-ODT) 8 MG disintegrating tablet Take 1 tablet (8 mg total) by mouth every 8 (eight) hours as needed for nausea or vomiting. Patient not taking: Reported on 93/79/0240 97/35/32   Carol Fuel, MD  VITAMIN D PO Take 2,000 Units by mouth daily in the afternoon. Patient not taking: Reported on 09/06/2022    [provider]    Physical Exam: Vitals:   09/22/22 1600 09/22/22 1630 09/22/22 1700 09/22/22 1733  BP: (!) 170/79 (!) 188/79 (!) 186/80   Pulse: 70 70 71   Resp: 19 (!) 27 (!) 23   Temp:    98.8 F (37.1 C)  TempSrc:    Oral  SpO2: 94% 94% 96%   Weight:      Height:        Constitutional: NAD, calm, comfortable Vitals:   09/22/22 1600 09/22/22 1630 09/22/22 1700 09/22/22 1733  BP: (!) 170/79 (!) 188/79 (!) 186/80   Pulse: 70 70 71   Resp: 19 (!) 27 (!) 23   Temp:    98.8 F (37.1 C)  TempSrc:    Oral  SpO2: 94% 94% 96%   Weight:      Height:       Eyes: PERRL, lids and conjunctivae normal ENMT: Mucous membranes are moist.   Neck: normal, supple, no masses, no thyromegaly Respiratory: clear to auscultation bilaterally, no wheezing, no crackles. Normal respiratory effort. No accessory muscle use.  Cardiovascular: Regular rate and rhythm, no murmurs / rubs / gallops.  2 + pitting bilateral lower extremity edema to knees.  Extremities warm. Abdomen: no tenderness, no masses palpated. No hepatosplenomegaly. Bowel sounds positive.  Musculoskeletal: no clubbing / cyanosis. No joint deformity upper and lower extremities.  Skin: no rashes, lesions, ulcers. No induration Neurologic: No apparent  cranial nerve abnormality moving EXTR spontaneously.[name]  Psychiatric: Normal judgment and insight. Alert and oriented x 3. Normal mood.   Labs on Admission: I have personally reviewed following labs and imaging studies  CBC: Recent Labs  Lab 09/22/22 1305  WBC 3.9*  HGB 8.6*  HCT 26.3*  MCV 95.6  PLT 992   Basic Metabolic Panel: Recent Labs  Lab 09/22/22 1305  NA 132*  K 4.0  CL 103  CO2 21*  GLUCOSE 166*  BUN 46*  CREATININE 2.26*  CALCIUM 7.6*  MG 2.0    Radiological Exams on Admission: DG Chest [facility] 1 [address]  Result Date: 09/22/2022 CLINICAL DATA:  Wheezing. EXAM: PORTABLE CHEST 1 VIEW COMPARISON:  CT 08/06/2022. Radiographs 06/26/2022. PET-CT 08/31/2022. FINDINGS: 1333 hours. The heart is enlarged, but stable. Aortic atherosclerosis noted. There is increased vascular congestion with new patchy left lower lobe airspace disease and a small left pleural effusion. No evidence of pneumothorax. No acute osseous findings are evident. Telemetry leads overlie the chest. IMPRESSION: Cardiomegaly with new vascular congestion and patchy left lower lobe airspace disease suspicious for pneumonia. Small left pleural effusion. Electronically Signed   By: Richardean Sale M.D.   On: 09/22/2022 13:47    EKG: Independently reviewed. Pending.  Assessment/Plan Principal Problem:   Acute on chronic diastolic (congestive) heart failure (HCC) Active Problems:   COVID-19 virus infection   Essential hypertension   Paroxysmal atrial fibrillation (HCC)   CKD (chronic kidney disease) stage 4, GFR 15-29 ml/min (HCC)   Diabetes mellitus (HCC)   Dementia (HCC)  Assessment and Plan: * Acute on chronic diastolic (congestive) heart failure (HCC) Some bilateral lower extremity swelling, per chart- baseline weight is around 140s, she is weighing 170 today.  Chest x-ray - new vascular congestion and patchy left lower lobe airspace disease suspicious for pneumonia. Small left pleural effusion.  BNP Elevated  611. -IV Lasix 40 twice daily -Monitor renal function closely -Strict input-Output, daily weights, daily BMP  COVID-19 virus infection Chest x-ray showing left lower lobe pneumonia.  Reports cough.  COVID test positive.  Received all COVID vaccines.  Denies known prior COVID infection. -Patient's Sister Carol Valenzuela is HCPOA, she has declined Paxlovid -Obtain and trend inflammatory panel -Hold off on further antibiotics for now -Check procalcitonin -IV Solu-Medrol weight-based for possible pneumonia  CKD (chronic kidney disease) stage 4, GFR 15-29 ml/min (HCC) Creatinine 2.26.  At baseline. -Monitor with diuresis  Paroxysmal atrial fibrillation (HCC) Rate controlled and on anticoagulation with Eliquis.  Sister reports compliance. -Resume Eliquis, labetalol  Essential hypertension Elevated. -Resume hydralazine, labetalol, Imdur -As needed hydralazine for systolic greater than 952  Dementia (HCC) Mental status at baseline.  She is able to answer simple questions appropriately, but sister reports she has to repeat instructions or answers to questions several times to patient. She ambulates at baseline without assistance or assistive devices.    Diabetes mellitus ([place]) - HgbA1c -Resume Tresiba at reduced dose 10 units daily(14u home dose). - SSI- S   DVT prophylaxis: Lovenox Code Status: Full code Family Communication: Sister Carol Valenzuela at bedside is HCPOA Disposition Plan: > 2 days Consults called: None Admission status: inpt tele I certify that at the point of admission it is my clinical judgment that the patient will require inpatient hospital care spanning beyond 2 midnights from the point of admission due to high intensity of service, high risk for further deterioration and high frequency of surveillance required.   Author: Bethena Roys, MD 09/22/2022 6:58 PM  For on call review [email or web address].

## 2022-09-23 DIAGNOSIS — I5033 Acute on chronic diastolic (congestive) heart failure: Secondary | ICD-10-CM | POA: Diagnosis not present

## 2022-09-23 LAB — COMPREHENSIVE METABOLIC PANEL
ALT: 16 U/L (ref 0–44)
AST: 17 U/L (ref 15–41)
Albumin: 2.5 g/dL — ABNORMAL LOW (ref 3.5–5.0)
Alkaline Phosphatase: 36 U/L — ABNORMAL LOW (ref 38–126)
Anion gap: 7 (ref 5–15)
BUN: 47 mg/dL — ABNORMAL HIGH (ref 8–23)
CO2: 20 mmol/L — ABNORMAL LOW (ref 22–32)
Calcium: 7.2 mg/dL — ABNORMAL LOW (ref 8.9–10.3)
Chloride: 106 mmol/L (ref 98–111)
Creatinine, Ser: 2.3 mg/dL — ABNORMAL HIGH (ref 0.44–1.00)
GFR, Estimated: 22 mL/min — ABNORMAL LOW (ref >60)
Glucose, Bld: 198 mg/dL — ABNORMAL HIGH (ref 70–99)
Potassium: 4.5 mmol/L (ref 3.5–5.1)
Sodium: 133 mmol/L — ABNORMAL LOW (ref 135–145)
Total Bilirubin: 0.5 mg/dL (ref 0.3–1.2)
Total Protein: 5.1 g/dL — ABNORMAL LOW (ref 6.5–8.1)

## 2022-09-23 LAB — D-DIMER, QUANTITATIVE: D-Dimer, Quant: 0.69 ug/mL-FEU — ABNORMAL HIGH (ref 0.00–0.50)

## 2022-09-23 LAB — GLUCOSE, CAPILLARY
Glucose-Capillary: 219 mg/dL — ABNORMAL HIGH (ref 70–99)
Glucose-Capillary: 297 mg/dL — ABNORMAL HIGH (ref 70–99)
Glucose-Capillary: 325 mg/dL — ABNORMAL HIGH (ref 70–99)
Glucose-Capillary: 374 mg/dL — ABNORMAL HIGH (ref 70–99)

## 2022-09-23 LAB — FERRITIN: Ferritin: 101 ng/mL (ref 11–307)

## 2022-09-23 LAB — PHOSPHORUS: Phosphorus: 3.5 mg/dL (ref 2.5–4.6)

## 2022-09-23 LAB — MAGNESIUM: Magnesium: 1.8 mg/dL (ref 1.7–2.4)

## 2022-09-23 LAB — C-REACTIVE PROTEIN: CRP: 0.6 mg/dL (ref <1.0)

## 2022-09-23 MED ORDER — RISAQUAD PO CAPS
2.0000 | ORAL_CAPSULE | Freq: Three times a day (TID) | ORAL | Status: DC
  Administered 2022-09-24 – 2022-09-25 (×3): 2 via ORAL
  Filled 2022-09-23 (×6): qty 2

## 2022-09-23 MED ORDER — ATORVASTATIN CALCIUM 40 MG PO TABS
40.0000 mg | ORAL_TABLET | Freq: Every day | ORAL | Status: DC
  Administered 2022-09-24: 40 mg via ORAL
  Filled 2022-09-23: qty 1

## 2022-09-23 MED ORDER — VITAMIN C 500 MG PO TABS
500.0000 mg | ORAL_TABLET | Freq: Every day | ORAL | Status: DC
  Administered 2022-09-23 – 2022-09-26 (×4): 500 mg via ORAL
  Filled 2022-09-23 (×4): qty 1

## 2022-09-23 MED ORDER — ZINC SULFATE 220 (50 ZN) MG PO CAPS
220.0000 mg | ORAL_CAPSULE | Freq: Every day | ORAL | Status: DC
  Administered 2022-09-23 – 2022-09-26 (×4): 220 mg via ORAL
  Filled 2022-09-23 (×4): qty 1

## 2022-09-23 NOTE — TOC Progression Note (Signed)
  Transition of Care [facility]) Screening Note   Patient Details  Name: Carol Valenzuela Date of Birth: 1951-02-21   Transition of Care [facility] CM/SW Contact:    Boneta Lucks, RN Phone Number: 09/23/2022, 2:17 PM    Transition of Care Department [facility]) has reviewed patient and no TOC needs have been identified at this time. We will continue to monitor patient advancement through interdisciplinary progression rounds. If new patient transition needs arise, please place a TOC consult.       Barriers to Discharge: Continued Medical Work up

## 2022-09-23 NOTE — Progress Notes (Signed)
PROGRESS NOTE    Patient: Carol Valenzuela                            PCP: Deland Pretty, MD                    DOB: 1951-04-06            DOA: 09/22/2022 UXN:[id number]             DOS: 09/23/2022, 11:45 AM   LOS: 1 day   Date of Service: The patient was seen and examined on 09/23/2022  Subjective:   The patient was seen and examined this morning. Hemodynamically stable.  Mildly hypertensive otherwise awake alert following commands no acute distress Satting 94% on room air, complaining of cough nonproductive  Brief Narrative:   Carol Valenzuela is a 72 y.o. female with medical history significant for atrial fibrillation, hypertension, diabetes mellitus, CKD 3, Dementia. Patient has dementia, she is able to answer simple questions appropriately, but details are provided by her [name] who is at bedside. Patient has had ongoing leg swelling since 12/21, Lasix dose was doubled for 3 days on the 21st, and then for 1 week on the 26th.  Subsequently she has been on 20 mg of Lasix, and subsequently leg swelling worsened.  She has had a cough over the past several days.  No difficulty breathing.  She has also had steady weight gain since November. Baseline she is able to walk without assistance or assistive devices, but due to leg swelling she has not been able to ambulate well. She is updated all her vaccines this year- including COVID, RSV, influenza.   ED Course: Sats >94% on room air.  Tmax 99.  Heart rate 60s to 70s.  Respiratory rate 17-27.  Chest x-ray shows cardiomegaly, vascular congestion, left lower lobe airspace disease suspicious for pneumonia. COVID test positive. BNP elevated 611. IV Lasix 40 mg x 1 given.  Hospitalist to admit for decompensated CHF.    Assessment & Plan:   Principal Problem:   Acute on chronic diastolic (congestive) heart failure (HCC) Active Problems:   COVID-19 virus infection   Essential hypertension   Paroxysmal atrial fibrillation (HCC)   CKD  (chronic kidney disease) stage 4, GFR 15-29 ml/min (HCC)   Diabetes mellitus (HCC)   Dementia (HCC)     Assessment and Plan: * Acute on chronic diastolic (congestive) heart failure (HCC) -Shortness of breath with exertion -Currently satting 94% on room air -Extensive lower extremity edema -Per chart and daughter has gained significant amount of weight in past few weeks  baseline weight is around 140s, she is weighing 170 today.   Chest x-ray - new vascular congestion and patchy left lower lobe airspace disease suspicious for pneumonia. Small left pleural effusion.  -  BNP Elevated 611. -Continue IV Lasix 40 twice daily -Monitor renal function closely -Strict input-Output, daily weights, daily BMP  COVID-19 virus infection -In no respiratory distress, satting 94% on room air, persistent cough Chest x-ray showing left lower lobe pneumonia.   - COVID test positive.  Received all COVID vaccines.   -Patient's Sister Orbie Hurst is HCPOA, she has declined Paxlovid  -ProCal negative, but holding antibiotics  -Continue IV Solu-Medrol weight-based for possible pneumonia  CKD (chronic kidney disease) stage 4, GFR 15-29 ml/min (HCC) Creatinine 2.26.  At baseline. -Monitor with diuresis Lab Results  Component Value Date   CREATININE 2.30 (H) 09/23/2022  CREATININE 2.26 (H) 09/22/2022   CREATININE 2.30 (H) 06/26/2022     Paroxysmal atrial fibrillation (HCC) Rate controlled and on anticoagulation with Eliquis.   -Resume Eliquis, labetalol  Essential hypertension BP (!) 168/85, pulse 78, temperature 98.5 F (36.9 C), RR18,  SpO2 94 % on RA  -Resume hydralazine, labetalol, Imdur -As needed hydralazine for systolic greater than 856  Dementia (HCC) Mental status at baseline.   -She is able to communicate, but cannot recall chronic medical issues -Difficulty with ambulation and ADLs now  - She ambulates at baseline without assistance or assistive devices.   -Obtaining PT OT for  evaluation and recommendation  Diabetes mellitus ([place]) - HgbA1c -Resume Tresiba at reduced dose 10 units daily(14u home dose). - SSI- S      --------------------------------------------------------------------------------------------------------------------------------------------- Nutritional status:  The patient's BMI is: Body mass index is 28.04 kg/m. I agree with the assessment and plan as outlined b  Skin Assessment: I have examined the patient's skin and I agree with the wound assessment as performed by wound care team ---------------------------------------------------------------------------------------------------------------------------------------------  DVT prophylaxis:   apixaban (ELIQUIS) tablet 5 mg   Code Status:   Code Status: Full Code  Family Communication: Daughter present at bedside The above findings and plan of care has been discussed with patient (and family)  in detail,  they expressed understanding and agreement of above. -Advance care planning has been discussed.   Admission status:   Status is: Inpatient Remains inpatient appropriate because: Needing aggressive diuresing, PT OT evaluation   Disposition: From  - home             Planning for discharge in 1-2 days: to   Procedures:   No admission procedures for hospital encounter.   Antimicrobials:  Anti-infectives (From admission, onward)    Start     Dose/Rate Route Frequency Ordered Stop   09/22/22 1730  cefTRIAXone (ROCEPHIN) 1 g in sodium chloride 0.9 % 100 mL IVPB        1 g 200 mL/hr over 30 Minutes Intravenous  Once 09/22/22 1715 09/22/22 1803   09/22/22 1730  azithromycin (ZITHROMAX) tablet 500 mg        500 mg Oral  Once 09/22/22 1715 09/22/22 1747        Medication:   acidophilus  2 capsule Oral TID   apixaban  5 mg Oral BID   ascorbic acid  500 mg Oral Daily   [START ON 09/24/2022] atorvastatin  40 mg Oral Daily   donepezil  10 mg Oral QHS   furosemide  40 mg  Intravenous Q12H   hydrALAZINE  100 mg Oral TID   insulin aspart  0-5 Units Subcutaneous QHS   insulin aspart  0-9 Units Subcutaneous TID WC   insulin glargine-yfgn  10 Units Subcutaneous Q24H   isosorbide dinitrate  60 mg Oral TID   methylPREDNISolone (SOLU-MEDROL) injection  40 mg Intravenous Q12H   Followed by   Derrill Memo ON 09/25/2022] predniSONE  50 mg Oral Daily   zinc sulfate  220 mg Oral Daily    acetaminophen **OR** acetaminophen, hydrALAZINE, ondansetron **OR** ondansetron (ZOFRAN) IV, polyethylene glycol   Objective:   Vitals:   09/22/22 1955 09/22/22 2038 09/23/22 0425 09/23/22 0500  BP: (!) 178/81 (!) 178/81 (!) 168/85   Pulse: 71 71 78   Resp: 20  18   Temp: (!) 97.3 F (36.3 C)  98.5 F (36.9 C)   TempSrc: Oral  Oral   SpO2: 93% 92% 94%   Weight:  78.8 kg  Height:        Intake/Output Summary (Last 24 hours) at 09/23/2022 1145 Last data filed at 09/23/2022 0427 Gross per 24 hour  Intake --  Output 1300 ml  Net -1300 ml   Filed Weights   09/22/22 1210 09/22/22 1823 09/23/22 0500  Weight: 77.6 kg 79.3 kg 78.8 kg     Physical examination:   Constitution:  Alert, cooperative, no distress,  Appears calm and comfortable  Psychiatric:   Normal and stable mood and affect, cognition intact,   HEENT:        Normocephalic, PERRL, otherwise with in Normal limits  Chest:         Chest symmetric Cardio vascular:  S1/S2, RRR, No murmure, No Rubs or Gallops  pulmonary: Clear to auscultation bilaterally, respirations unlabored, negative wheezes / crackles Abdomen: Soft, non-tender, non-distended, bowel sounds,no masses, no organomegaly Muscular skeletal: Limited exam - in bed, able to move all 4 extremities,   Neuro: CNII-XII intact. , normal motor and sensation, reflexes intact  Extremities: ++3 pitting edema lower extremities, +2 pulses  Skin: Dry, warm to touch, negative for any Rashes, No open wounds Wounds: per nursing  documentation   ------------------------------------------------------------------------------------------------------------------------------------------    LABs:     Latest Ref Rng & Units 09/22/2022    1:05 PM 06/26/2022    9:11 PM 05/02/2022    1:45 PM  CBC  WBC 4.0 - 10.5 K/uL 3.9  11.7    Hemoglobin 12.0 - 15.0 g/dL 8.6  9.9  9.9   Hematocrit 36.0 - 46.0 % 26.3  29.6  29.0   Platelets 150 - 400 K/uL 170  230        Latest Ref Rng & Units 09/23/2022    5:18 AM 09/22/2022    1:05 PM 06/26/2022    9:11 PM  CMP  Glucose 70 - 99 mg/dL 198  166  157   BUN 8 - 23 mg/dL 47  46  50   Creatinine 0.44 - 1.00 mg/dL 2.30  2.26  2.30   Sodium 135 - 145 mmol/L 133  132  133   Potassium 3.5 - 5.1 mmol/L 4.5  4.0  3.9   Chloride 98 - 111 mmol/L 106  103  103   CO2 22 - 32 mmol/L 20  21  23    Calcium 8.9 - 10.3 mg/dL 7.2  7.6  8.4   Total Protein 6.5 - 8.1 g/dL 5.1     Total Bilirubin 0.3 - 1.2 mg/dL 0.5     Alkaline Phos 38 - 126 U/L 36     AST 15 - 41 U/L 17     ALT 0 - 44 U/L 16          Micro Results Recent Results (from the past 240 hour(s))  Resp panel by RT-PCR (RSV, Flu A&B, Covid) Anterior Nasal Swab     Status: Abnormal   Collection Time: 09/22/22  2:12 PM   Specimen: Anterior Nasal Swab  Result Value Ref Range Status   SARS Coronavirus 2 by RT PCR POSITIVE (A) NEGATIVE Final    Comment: (NOTE) SARS-CoV-2 target nucleic acids are DETECTED.  The SARS-CoV-2 RNA is generally detectable in upper respiratory specimens during the acute phase of infection. Positive results are indicative of the presence of the identified virus, but do not rule out bacterial infection or co-infection with other pathogens not detected by the test. Clinical correlation with patient history and other diagnostic information is necessary to determine patient  infection status. The expected result is Negative.  Fact Sheet for Patients: EntrepreneurPulse.com.au  Fact Sheet for  Healthcare Providers: IncredibleEmployment.be  This test is not yet approved or cleared by the [place] FDA and  has been authorized for detection and/or diagnosis of SARS-CoV-2 by FDA under an Emergency Use Authorization (EUA).  This EUA will remain in effect (meaning this test can be used) for the duration of  the COVID-19 declaration under Section 564(b)(1) of the A ct, 21 U.S.C. section 360bbb-3(b)(1), unless the authorization is terminated or revoked sooner.     Influenza A by PCR NEGATIVE NEGATIVE Final   Influenza B by PCR NEGATIVE NEGATIVE Final    Comment: (NOTE) The Xpert Xpress SARS-CoV-2/FLU/RSV plus assay is intended as an aid in the diagnosis of influenza from Nasopharyngeal swab specimens and should not be used as a sole basis for treatment. Nasal washings and aspirates are unacceptable for Xpert Xpress SARS-CoV-2/FLU/RSV testing.  Fact Sheet for Patients: EntrepreneurPulse.com.au  Fact Sheet for Healthcare Providers: IncredibleEmployment.be  This test is not yet approved or cleared by the [place] FDA and has been authorized for detection and/or diagnosis of SARS-CoV-2 by FDA under an Emergency Use Authorization (EUA). This EUA will remain in effect (meaning this test can be used) for the duration of the COVID-19 declaration under Section 564(b)(1) of the Act, 21 U.S.C. section 360bbb-3(b)(1), unless the authorization is terminated or revoked.     Resp Syncytial Virus by PCR NEGATIVE NEGATIVE Final    Comment: (NOTE) Fact Sheet for Patients: EntrepreneurPulse.com.au  Fact Sheet for Healthcare Providers: IncredibleEmployment.be  This test is not yet approved or cleared by the [place] FDA and has been authorized for detection and/or diagnosis of SARS-CoV-2 by FDA under an Emergency Use Authorization (EUA). This EUA will remain in effect (meaning this  test can be used) for the duration of the COVID-19 declaration under Section 564(b)(1) of the Act, 21 U.S.C. section 360bbb-3(b)(1), unless the authorization is terminated or revoked.  Performed at [facility], [address] [place], [place] [address]     Radiology Reports DG Chest [place] 1 View  Result Date: 09/22/2022 CLINICAL DATA:  Wheezing. EXAM: PORTABLE CHEST 1 VIEW COMPARISON:  CT 08/06/2022. Radiographs 06/26/2022. PET-CT 08/31/2022. FINDINGS: 1333 hours. The heart is enlarged, but stable. Aortic atherosclerosis noted. There is increased vascular congestion with new patchy left lower lobe airspace disease and a small left pleural effusion. No evidence of pneumothorax. No acute osseous findings are evident. Telemetry leads overlie the chest. IMPRESSION: Cardiomegaly with new vascular congestion and patchy left lower lobe airspace disease suspicious for pneumonia. Small left pleural effusion. Electronically Signed   By: Richardean Sale M.D.   On: 09/22/2022 13:47    SIGNED: Deatra James, MD, FHM. FAAFP. [facility] - [facility] Time spent > 35 min.  In seeing, evaluating and examining the patient. Reviewing medical records, labs, drawn plan of care. [facility],  Pager (please use [email or web address] to page/ text) Please use Epic Secure Chat for non-urgent communication (7AM-7PM)  If 7PM-7AM, please contact night-coverage [email or web address], 09/23/2022, 11:45 AM

## 2022-09-23 NOTE — Progress Notes (Signed)
Pt had 2 incontinent stools at start of shift, type 6.  Denies any ABD pain, cramping, nausea.  Pt denies SHOB.  Productive cough, pt swallowing sputum.  4+ LE edema.  Hydralazine given for elevated BP with improved results.  NSR on monitor.  Denies pain.  SCDs in place, bed alarm on.

## 2022-09-23 NOTE — Hospital Course (Signed)
Carol Valenzuela is a 72 y.o. female with medical history significant for atrial fibrillation, hypertension, diabetes mellitus, CKD 3, Dementia. Patient has dementia, she is able to answer simple questions appropriately, but details are provided by her [name] who is at bedside. Patient has had ongoing leg swelling since 12/21, Lasix dose was doubled for 3 days on the 21st, and then for 1 week on the 26th.  Subsequently she has been on 20 mg of Lasix, and subsequently leg swelling worsened.  She has had a cough over the past several days.  No difficulty breathing.  She has also had steady weight gain since November. Baseline she is able to walk without assistance or assistive devices, but due to leg swelling she has not been able to ambulate well. She is updated all her vaccines this year- including COVID, RSV, influenza.   ED Course: Sats >94% on room air.  Tmax 99.  Heart rate 60s to 70s.  Respiratory rate 17-27.  Chest x-ray shows cardiomegaly, vascular congestion, left lower lobe airspace disease suspicious for pneumonia. COVID test positive. BNP elevated 611. IV Lasix 40 mg x 1 given.  Hospitalist to admit for decompensated CHF.

## 2022-09-24 DIAGNOSIS — I5033 Acute on chronic diastolic (congestive) heart failure: Secondary | ICD-10-CM | POA: Diagnosis not present

## 2022-09-24 LAB — COMPREHENSIVE METABOLIC PANEL
ALT: 16 U/L (ref 0–44)
AST: 17 U/L (ref 15–41)
Albumin: 2.7 g/dL — ABNORMAL LOW (ref 3.5–5.0)
Alkaline Phosphatase: 38 U/L (ref 38–126)
Anion gap: 10 (ref 5–15)
BUN: 52 mg/dL — ABNORMAL HIGH (ref 8–23)
CO2: 19 mmol/L — ABNORMAL LOW (ref 22–32)
Calcium: 7.1 mg/dL — ABNORMAL LOW (ref 8.9–10.3)
Chloride: 101 mmol/L (ref 98–111)
Creatinine, Ser: 2.53 mg/dL — ABNORMAL HIGH (ref 0.44–1.00)
GFR, Estimated: 20 mL/min — ABNORMAL LOW (ref >60)
Glucose, Bld: 350 mg/dL — ABNORMAL HIGH (ref 70–99)
Potassium: 4.3 mmol/L (ref 3.5–5.1)
Sodium: 130 mmol/L — ABNORMAL LOW (ref 135–145)
Total Bilirubin: 0.7 mg/dL (ref 0.3–1.2)
Total Protein: 5.4 g/dL — ABNORMAL LOW (ref 6.5–8.1)

## 2022-09-24 LAB — GLUCOSE, CAPILLARY
Glucose-Capillary: 335 mg/dL — ABNORMAL HIGH (ref 70–99)
Glucose-Capillary: 372 mg/dL — ABNORMAL HIGH (ref 70–99)
Glucose-Capillary: 431 mg/dL — ABNORMAL HIGH (ref 70–99)
Glucose-Capillary: 455 mg/dL — ABNORMAL HIGH (ref 70–99)
Glucose-Capillary: 416 mg/dL — ABNORMAL HIGH (ref 70–99)

## 2022-09-24 LAB — BRAIN NATRIURETIC PEPTIDE: B Natriuretic Peptide: 365 pg/mL — ABNORMAL HIGH (ref 0.0–100.0)

## 2022-09-24 LAB — HEMOGLOBIN A1C
Hgb A1c MFr Bld: 8.4 % — ABNORMAL HIGH (ref 4.8–5.6)
Mean Plasma Glucose: 194 mg/dL

## 2022-09-24 LAB — MAGNESIUM: Magnesium: 1.9 mg/dL (ref 1.7–2.4)

## 2022-09-24 MED ORDER — INSULIN ASPART 100 UNIT/ML IJ SOLN
0.0000 [IU] | Freq: Three times a day (TID) | INTRAMUSCULAR | Status: DC
  Administered 2022-09-25: 4 [IU] via SUBCUTANEOUS
  Administered 2022-09-25: 11 [IU] via SUBCUTANEOUS

## 2022-09-24 MED ORDER — INSULIN GLARGINE-YFGN 100 UNIT/ML ~~LOC~~ SOLN
10.0000 [IU] | Freq: Two times a day (BID) | SUBCUTANEOUS | Status: DC
  Administered 2022-09-24 (×2): 10 [IU] via SUBCUTANEOUS
  Filled 2022-09-24 (×6): qty 0.1

## 2022-09-24 MED ORDER — LIVING BETTER WITH HEART FAILURE BOOK: Freq: Once | Status: AC

## 2022-09-24 MED ORDER — INSULIN ASPART 100 UNIT/ML IJ SOLN: 20.0000 [IU] | Freq: Once | INTRAMUSCULAR | Status: DC

## 2022-09-24 MED ORDER — ALBUMIN HUMAN 25 % IV SOLN
25.0000 g | Freq: Once | INTRAVENOUS | Status: AC
  Administered 2022-09-24: 25 g via INTRAVENOUS
  Filled 2022-09-24: qty 100

## 2022-09-24 MED ORDER — INSULIN ASPART 100 UNIT/ML IJ SOLN: 0.0000 [IU] | Freq: Three times a day (TID) | INTRAMUSCULAR | Status: DC

## 2022-09-24 MED ORDER — INSULIN ASPART 100 UNIT/ML IJ SOLN
20.0000 [IU] | Freq: Once | INTRAMUSCULAR | Status: AC
  Administered 2022-09-24: 20 [IU] via SUBCUTANEOUS

## 2022-09-24 MED ORDER — LABETALOL HCL 200 MG PO TABS
100.0000 mg | ORAL_TABLET | Freq: Two times a day (BID) | ORAL | Status: DC
  Administered 2022-09-24 – 2022-09-25 (×3): 100 mg via ORAL
  Filled 2022-09-24 (×3): qty 1

## 2022-09-24 MED ORDER — PREDNISONE 20 MG PO TABS
40.0000 mg | ORAL_TABLET | Freq: Every day | ORAL | Status: DC
  Filled 2022-09-24: qty 2

## 2022-09-24 MED ORDER — INSULIN ASPART 100 UNIT/ML IJ SOLN
0.0000 [IU] | Freq: Every day | INTRAMUSCULAR | Status: DC
  Administered 2022-09-24: 5 [IU] via SUBCUTANEOUS

## 2022-09-24 NOTE — Progress Notes (Signed)
PROGRESS NOTE    Patient: Carol Valenzuela                            PCP: Deland Pretty, MD                    DOB: 1950/11/29            DOA: 09/22/2022 [id number]             DOS: 09/24/2022, 1:32 PM   LOS: 2 days   Date of Service: The patient was seen and examined on 09/24/2022  Subjective:   The patient was seen and examined this morning, stable no acute distress Pleasantly confused Still complaining of lower extremity edema  Blood sugars running high -on steroids Denies of having any chest pain or shortness of breath Satting 96% on room air No issues overnight Brief Narrative:   Carol Valenzuela is a 72 y.o. female with medical history significant for atrial fibrillation, hypertension, diabetes mellitus, CKD 3, Dementia. Patient has dementia, she is able to answer simple questions appropriately, but details are provided by her [name] who is at bedside. Patient has had ongoing leg swelling since 12/21, Lasix dose was doubled for 3 days on the 21st, and then for 1 week on the 26th.  Subsequently she has been on 20 mg of Lasix, and subsequently leg swelling worsened.  She has had a cough over the past several days.  No difficulty breathing.  She has also had steady weight gain since November. Baseline she is able to walk without assistance or assistive devices, but due to leg swelling she has not been able to ambulate well. She is updated all her vaccines this year- including COVID, RSV, influenza.   ED Course: Sats >94% on room air.  Tmax 99.  Heart rate 60s to 70s.  Respiratory rate 17-27.  Chest x-ray shows cardiomegaly, vascular congestion, left lower lobe airspace disease suspicious for pneumonia. COVID test positive. BNP elevated 611. IV Lasix 40 mg x 1 given.  Hospitalist to admit for decompensated CHF.    Assessment & Plan:   Principal Problem:   Acute on chronic diastolic (congestive) heart failure (HCC) Active Problems:   COVID-19 virus infection   Essential  hypertension   Paroxysmal atrial fibrillation (HCC)   CKD (chronic kidney disease) stage 4, GFR 15-29 ml/min (HCC)   Diabetes mellitus (HCC)   Dementia (HCC)     Assessment and Plan: * Acute on chronic diastolic (congestive) heart failure (HCC) -Still having shortness of breath with exertion -Currently satting 96% on room air -Extensive lower extremity edema -mild improvement -Poorly, not diuresing well  baseline weight is around 140s, POA: she is weighing 170 today.    Intake/Output Summary (Last 24 hours) at 09/24/2022 1328 Last data filed at 09/24/2022 0645 Gross per 24 hour  Intake 840 ml  Output 1100 ml  Net -260 ml   Filed Weights   09/22/22 1823 09/23/22 0500 09/24/22 0500  Weight: 79.3 kg 78.8 kg 77.9 kg      Chest x-ray - new vascular congestion and patchy left lower lobe airspace disease suspicious for pneumonia. Small left pleural effusion.  -  BNP Elevated 611. -Continue IV Lasix 40 twice daily >> increasing to 60 mg  -Monitor renal function closely -Strict input-Output, daily weights, daily BMP  COVID-19 virus infection -No respiratory distress, satting 96% on room air Chest x-ray showing ?  left lower  lobe pneumonia.   - COVID test positive.  Received all COVID vaccines.   -Patient's Sister Orbie Hurst is HCPOA, she has declined Paxlovid  -ProCal negative, but holding antibiotics  -Continue IV Solu-Medrol weight-based for possible pneumonia  CKD (chronic kidney disease) stage 4, GFR 15-29 ml/min (HCC) Creatinine 2.26.  At baseline. -Monitor with diuresis Lab Results  Component Value Date   CREATININE 2.53 (H) 09/24/2022   CREATININE 2.30 (H) 09/23/2022   CREATININE 2.26 (H) 09/22/2022     Paroxysmal atrial fibrillation (HCC) Rate controlled and on anticoagulation with Eliquis.   -Resume Eliquis, labetalol  Essential hypertension Hypertensive -Increasing labetalol 200 mg p.o. twice daily  -Resume hydralazine, labetalol, Imdur -As needed  hydralazine for systolic greater than 081  Dementia (HCC) Mental status at baseline.   -She is able to communicate, but cannot recall chronic medical issues -Difficulty with ambulation and ADLs now  - She ambulates at baseline without assistance or assistive devices.   -Obtaining PT OT for evaluation and recommendation  Diabetes mellitus ([place]) - HgbA1c -Resume Tresiba at reduced dose 10 units daily(14u home dose). - SSI- S      --------------------------------------------------------------------------------------------------------------------------------------------- Nutritional status:  The patient's BMI is: Body mass index is 27.72 kg/m. I agree with the assessment and plan as outlined b  Skin Assessment: I have examined the patient's skin and I agree with the wound assessment as performed by wound care team ---------------------------------------------------------------------------------------------------------------------------------------------  DVT prophylaxis:   apixaban (ELIQUIS) tablet 5 mg   Code Status:   Code Status: Full Code  Family Communication: Daughter present at bedside The above findings and plan of care has been discussed with patient (and family)  in detail,  they expressed understanding and agreement of above. -Advance care planning has been discussed.   Admission status:   Status is: Inpatient Remains inpatient appropriate because: Needing aggressive diuresing, PT OT evaluation   Disposition: From  - home             Planning for discharge in 1-2 days: to   Procedures:   No admission procedures for hospital encounter.   Antimicrobials:  Anti-infectives (From admission, onward)    Start     Dose/Rate Route Frequency Ordered Stop   09/22/22 1730  cefTRIAXone (ROCEPHIN) 1 g in sodium chloride 0.9 % 100 mL IVPB        1 g 200 mL/hr over 30 Minutes Intravenous  Once 09/22/22 1715 09/22/22 1803   09/22/22 1730  azithromycin (ZITHROMAX)  tablet 500 mg        500 mg Oral  Once 09/22/22 1715 09/22/22 1747        Medication:   acidophilus  2 capsule Oral TID   apixaban  5 mg Oral BID   ascorbic acid  500 mg Oral Daily   atorvastatin  40 mg Oral Daily   donepezil  10 mg Oral QHS   furosemide  40 mg Intravenous Q12H   hydrALAZINE  100 mg Oral TID   insulin aspart  0-5 Units Subcutaneous QHS   insulin aspart  0-9 Units Subcutaneous TID WC   insulin glargine-yfgn  10 Units Subcutaneous BID   isosorbide dinitrate  60 mg Oral TID   labetalol  100 mg Oral BID   Living Better with Heart Failure Book   Does not apply Once   [START ON 09/25/2022] predniSONE  40 mg Oral Q breakfast   zinc sulfate  220 mg Oral Daily    acetaminophen **OR** acetaminophen, hydrALAZINE, ondansetron **OR** ondansetron (ZOFRAN) IV,  polyethylene glycol   Objective:   Vitals:   09/23/22 1353 09/23/22 2104 09/24/22 0500 09/24/22 1020  BP: (!) 155/74 (!) 181/90  (!) 185/5  Pulse: 78 75  76  Resp: 20 20  19   Temp: 98.3 F (36.8 C) (!) 97.5 F (36.4 C)  97.8 F (36.6 C)  TempSrc: Oral   Oral  SpO2: 93% 95%  96%  Weight:   77.9 kg   Height:        Intake/Output Summary (Last 24 hours) at 09/24/2022 1332 Last data filed at 09/24/2022 0645 Gross per 24 hour  Intake 840 ml  Output 1100 ml  Net -260 ml   Filed Weights   09/22/22 1823 09/23/22 0500 09/24/22 0500  Weight: 79.3 kg 78.8 kg 77.9 kg     Physical examination:      General:  AAO x 3,  cooperative, no distress;   HEENT:  Normocephalic, PERRL, otherwise with in Normal limits   Neuro:  CNII-XII intact. , normal motor and sensation, reflexes intact   Lungs:   Clear to auscultation BL, Respirations unlabored,  No wheezes / crackles  Cardio:    S1/S2, RRR, No murmure, No Rubs or Gallops   Abdomen:  Soft, non-tender, bowel sounds active all four quadrants, no guarding or peritoneal signs.  Muscular  skeletal:  Limited exam -global generalized weaknesses - in bed, able to move all  4 extremities,   2+ pulses,  symmetric, No pitting edema  Skin:  Dry, warm to touch, negative for any Rashes,  Wounds: Please see nursing documentation         ------------------------------------------------------------------------------------------------------------------------------------------    LABs:     Latest Ref Rng & Units 09/22/2022    1:05 PM 06/26/2022    9:11 PM 05/02/2022    1:45 PM  CBC  WBC 4.0 - 10.5 K/uL 3.9  11.7    Hemoglobin 12.0 - 15.0 g/dL 8.6  9.9  9.9   Hematocrit 36.0 - 46.0 % 26.3  29.6  29.0   Platelets 150 - 400 K/uL 170  230        Latest Ref Rng & Units 09/24/2022    7:08 AM 09/23/2022    5:18 AM 09/22/2022    1:05 PM  CMP  Glucose 70 - 99 mg/dL 350  198  166   BUN 8 - 23 mg/dL 52  47  46   Creatinine 0.44 - 1.00 mg/dL 2.53  2.30  2.26   Sodium 135 - 145 mmol/L 130  133  132   Potassium 3.5 - 5.1 mmol/L 4.3  4.5  4.0   Chloride 98 - 111 mmol/L 101  106  103   CO2 22 - 32 mmol/L 19  20  21    Calcium 8.9 - 10.3 mg/dL 7.1  7.2  7.6   Total Protein 6.5 - 8.1 g/dL 5.4  5.1    Total Bilirubin 0.3 - 1.2 mg/dL 0.7  0.5    Alkaline Phos 38 - 126 U/L 38  36    AST 15 - 41 U/L 17  17    ALT 0 - 44 U/L 16  16         Micro Results Recent Results (from the past 240 hour(s))  Resp panel by RT-PCR (RSV, Flu A&B, Covid) Anterior Nasal Swab     Status: Abnormal   Collection Time: 09/22/22  2:12 PM   Specimen: Anterior Nasal Swab  Result Value Ref Range Status   SARS Coronavirus 2 by  RT PCR POSITIVE (A) NEGATIVE Final    Comment: (NOTE) SARS-CoV-2 target nucleic acids are DETECTED.  The SARS-CoV-2 RNA is generally detectable in upper respiratory specimens during the acute phase of infection. Positive results are indicative of the presence of the identified virus, but do not rule out bacterial infection or co-infection with other pathogens not detected by the test. Clinical correlation with patient history and other diagnostic information is  necessary to determine patient infection status. The expected result is Negative.  Fact Sheet for Patients: EntrepreneurPulse.com.au  Fact Sheet for Healthcare Providers: IncredibleEmployment.be  This test is not yet approved or cleared by the [place] FDA and  has been authorized for detection and/or diagnosis of SARS-CoV-2 by FDA under an Emergency Use Authorization (EUA).  This EUA will remain in effect (meaning this test can be used) for the duration of  the COVID-19 declaration under Section 564(b)(1) of the A ct, 21 U.S.C. section 360bbb-3(b)(1), unless the authorization is terminated or revoked sooner.     Influenza A by PCR NEGATIVE NEGATIVE Final   Influenza B by PCR NEGATIVE NEGATIVE Final    Comment: (NOTE) The Xpert Xpress SARS-CoV-2/FLU/RSV plus assay is intended as an aid in the diagnosis of influenza from Nasopharyngeal swab specimens and should not be used as a sole basis for treatment. Nasal washings and aspirates are unacceptable for Xpert Xpress SARS-CoV-2/FLU/RSV testing.  Fact Sheet for Patients: EntrepreneurPulse.com.au  Fact Sheet for Healthcare Providers: IncredibleEmployment.be  This test is not yet approved or cleared by the [place] FDA and has been authorized for detection and/or diagnosis of SARS-CoV-2 by FDA under an Emergency Use Authorization (EUA). This EUA will remain in effect (meaning this test can be used) for the duration of the COVID-19 declaration under Section 564(b)(1) of the Act, 21 U.S.C. section 360bbb-3(b)(1), unless the authorization is terminated or revoked.     Resp Syncytial Virus by PCR NEGATIVE NEGATIVE Final    Comment: (NOTE) Fact Sheet for Patients: EntrepreneurPulse.com.au  Fact Sheet for Healthcare Providers: IncredibleEmployment.be  This test is not yet approved or cleared by the [place] FDA  and has been authorized for detection and/or diagnosis of SARS-CoV-2 by FDA under an Emergency Use Authorization (EUA). This EUA will remain in effect (meaning this test can be used) for the duration of the COVID-19 declaration under Section 564(b)(1) of the Act, 21 U.S.C. section 360bbb-3(b)(1), unless the authorization is terminated or revoked.  Performed at [facility], [address] [place], [place] [address]     Radiology Reports No results found.  SIGNED: Deatra James, MD, FHM. FAAFP. Zacarias Pontes - [facility] Time spent > 35 min.  In seeing, evaluating and examining the patient. Reviewing medical records, labs, drawn plan of care. [facility],  Pager (please use [email or web address] to page/ text) Please use Epic Secure Chat for non-urgent communication (7AM-7PM)  If 7PM-7AM, please contact night-coverage [email or web address], 09/24/2022, 1:32 PM

## 2022-09-24 NOTE — Evaluation (Signed)
Physical Therapy Evaluation Patient Details Name: Carol Valenzuela MRN: [id number] DOB: 18-Jun-1951 Today's Date: 09/24/2022  History of Present Illness  Carol Valenzuela is a 72 y.o. female with medical history significant for atrial fibrillation, hypertension, diabetes mellitus, CKD 3, Dementia.  Patient has dementia, she is able to answer simple questions appropriately, but details are provided by her [name] who is at bedside.  Patient has had ongoing leg swelling since 12/21, Lasix dose was doubled for 3 days on the 21st, and then for 1 week on the 26th.  Subsequently she has been on 20 mg of Lasix, and subsequently leg swelling worsened.  She has had a cough over the past several days.  No difficulty breathing.  She has also had steady weight gain since November.  Baseline she is able to walk without assistance or assistive devices, but due to leg swelling she has not been able to ambulate well.  She is updated all her vaccines this year- including COVID, RSV, influenza.   Clinical Impression  Patient demonstrates good return for bed mobility, transferring to/from commode in bathroom and ambulated in room without loss of balance without need for an AD.  Patient encouraged to ambulate ad lib in room as tolerated for length of stay.  Patient tolerated sitting up in chair after therapy with her sister present in room.  Patient will benefit from continued skilled physical therapy in hospital and recommended venue below to increase strength, balance, endurance for safe ADLs and gait.         Recommendations for follow up therapy are one component of a multi-disciplinary discharge planning process, led by the attending physician.  Recommendations may be updated based on patient status, additional functional criteria and insurance authorization.  Follow Up Recommendations Home health PT      Assistance Recommended at Discharge Set up Supervision/Assistance  Patient can return home with the  following  A little help with walking and/or transfers;A little help with bathing/dressing/bathroom;Help with stairs or ramp for entrance;Assistance with cooking/housework    Equipment Recommendations None recommended by PT  Recommendations for Other Services       Functional Status Assessment Patient has had a recent decline in their functional status and demonstrates the ability to make significant improvements in function in a reasonable and predictable amount of time.     Precautions / Restrictions Precautions Precautions: Fall Restrictions Weight Bearing Restrictions: No      Mobility  Bed Mobility Overal bed mobility: Modified Independent                  Transfers Overall transfer level: Modified independent                      Ambulation/Gait Ambulation/Gait assistance: Supervision, Min guard Gait Distance (Feet): 45 Feet Assistive device: None Gait Pattern/deviations: Decreased step length - right, Decreased step length - left, Decreased stride length Gait velocity: decreased     General Gait Details: slightly labored cadence without requiring use of AD, no loss of balance, limited mostly due to c/o fatigue  Stairs            Wheelchair Mobility    Modified Rankin (Stroke Patients Only)       Balance Overall balance assessment: Mild deficits observed, not formally tested  Pertinent Vitals/Pain Pain Assessment Pain Assessment: No/denies pain    Home Living Family/patient expects to be discharged to:: Private residence Living Arrangements: Other relatives Available Help at Discharge: Family;Available 24 hours/day Type of Home: House Home Access: Ramped entrance Entrance Stairs-Rails: None   Alternate Level Stairs-Number of Steps: Patient does not go upstairs Home Layout: Two level;Able to live on main level with bedroom/bathroom;Full bath on main level Home  Equipment: Rolling Walker (2 wheels);Cane - single point;BSC/3in1;Shower seat;Grab bars - tub/shower      Prior Function Prior Level of Function : Independent/Modified Independent             Mobility Comments: household and short distanced community ambulator ADLs Comments: Assisted by family     Hand Dominance        Extremity/Trunk Assessment   Upper Extremity Assessment Upper Extremity Assessment: Defer to OT evaluation    Lower Extremity Assessment Lower Extremity Assessment: Generalized weakness    Cervical / Trunk Assessment Cervical / Trunk Assessment: Normal  Communication   Communication: No difficulties  Cognition Arousal/Alertness: Awake/alert Behavior During Therapy: WFL for tasks assessed/performed Overall Cognitive Status: Within Functional Limits for tasks assessed                                          General Comments      Exercises     Assessment/Plan    PT Assessment Patient needs continued PT services  PT Problem List Decreased strength;Decreased activity tolerance;Decreased balance;Decreased mobility       PT Treatment Interventions DME instruction;Gait training;Stair training;Functional mobility training;Therapeutic activities;Therapeutic exercise;Patient/family education;Balance training    PT Goals (Current goals can be found in the Care Plan section)  Acute Rehab PT Goals Patient Stated Goal: return home with family to assist PT Goal Formulation: With patient/family Time For Goal Achievement: 09/28/22 Potential to Achieve Goals: Good    Frequency Min 2X/week     Co-evaluation               AM-PAC PT "6 Clicks" Mobility  Outcome Measure Help needed turning from your back to your side while in a flat bed without using bedrails?: None Help needed moving from lying on your back to sitting on the side of a flat bed without using bedrails?: None Help needed moving to and from a bed to a chair (including  a wheelchair)?: None Help needed standing up from a chair using your arms (e.g., wheelchair or bedside chair)?: None Help needed to walk in hospital room?: A Little Help needed climbing 3-5 steps with a railing? : A Little 6 Click Score: 22    End of Session   Activity Tolerance: Patient tolerated treatment well;Patient limited by fatigue Patient left: in chair;with call bell/phone within reach;with family/visitor present Nurse Communication: Mobility status PT Visit Diagnosis: Unsteadiness on feet (R26.81);Other abnormalities of gait and mobility (R26.89);Muscle weakness (generalized) (M62.81)    Time: 9528-4132 PT Time Calculation (min) (ACUTE ONLY): 31 min   Charges:   PT Evaluation $PT Eval Moderate Complexity: 1 Mod PT Treatments $Therapeutic Activity: 23-37 mins        3:49 PM, 09/24/22 Lonell Grandchild, MPT Physical Therapist with [facility] [phone] office [phone] mobile phone

## 2022-09-24 NOTE — TOC Initial Note (Signed)
Transition of Care [facility]) - Initial/Assessment Note    Patient Details  Name: Carol Valenzuela MRN: [id number] Date of Birth: Jun 28, 1951  Transition of Care [facility]) CM/SW Contact:    Salome Arnt, [place] Phone Number: 09/24/2022, 8:46 AM  Clinical Narrative: Pt admitted with acute on chronic CHF. Assessment completed due to high risk readmission score. Assessment completed with pt's sister, Carol Valenzuela due to dementia- pt oriented to self and place only. Maxine reports she or private duty CNA is with pt 24/7 at home. At baseline, pt is fairly independent with ADLs. PT evaluation pending. Maxine reports if pt is able to ambulate then she will take pt home with home health. They have been working on getting pt placed at assisted living in [place]. However, if pt is unable to ambulate she would be interested in SNF at [facility] or [facility] in [place]. She understands that since pt is COVID + some facilities may not consider pt. TOC will follow up after PT evaluation.                  TOC consulted for CHF screening. Maxine reports she has not been weighing pt regularly. Pt does take medications as prescribed and follows heart healthy diet. Will order Living Well with CHF book.   Expected Discharge Plan:  (To be determined) Barriers to Discharge: Continued Medical Work up   Patient Goals and CMS Choice Patient states their goals for this hospitalization and ongoing recovery are:: unsure at this time   Choice offered to / list presented to : Sibling      Expected Discharge Plan and Services In-house Referral: Clinical Social Work     Living arrangements for the past 2 months: Single Family Home                                      Prior Living Arrangements/Services Living arrangements for the past 2 months: Single Family Home Lives with:: Siblings Patient language and need for interpreter reviewed:: Yes Do you feel safe going back to the place where you  live?: Yes      Need for Family Participation in Patient Care: Yes (Comment) Care giver support system in place?: Yes (comment) Current home services: DME (cane, walker, wheelchair) Criminal Activity/Legal Involvement Pertinent to Current Situation/Hospitalization: No - Comment as needed  Activities of Daily Living      Permission Sought/Granted                  Emotional Assessment   Attitude/Demeanor/Rapport: Unable to Assess Affect (typically observed): Unable to Assess Orientation: : Oriented to Self, Oriented to Place Alcohol / Substance Use: Not Applicable Psych Involvement: No (comment)  Admission diagnosis:  Acute on chronic diastolic (congestive) heart failure (HCC) [I50.33] Acute on chronic congestive heart failure, unspecified heart failure type ([place]) [I50.9] COVID [U07.1] Patient Active Problem List   Diagnosis Date Noted   Acute on chronic diastolic (congestive) heart failure ([place]) 09/22/2022   COVID-19 virus infection 09/22/2022   Dementia ([place]) 09/22/2022   Pulmonary nodule 1 cm or greater in diameter 09/06/2022   CKD (chronic kidney disease) stage 4, GFR 15-29 ml/min ([place]) 04/16/2022   AKI (acute kidney injury) ([place]) 01/26/2022   Leg edema 12/29/2021   Pulmonary hypertension ([place]) 08/20/2019   Paroxysmal atrial fibrillation ([place]) 06/29/2019   Hyperlipidemia 04/24/2018   Musculoskeletal pain 04/24/2018   Obesity, unspecified 04/24/2018  Diabetes mellitus ([place]) 09/28/2016   Essential hypertension 09/28/2016   Anxiety associated with depression 02/16/2014   Diabetes mellitus type 2, uncomplicated ([place]) 84/66/5993   Wart 06/08/2011   PCP:  Deland Pretty, MD Pharmacy:   [facility] [place], [place] [address] Phone: [phone] Fax: [phone]  [id number] [place], [place] [address] [place] [address] Phone: [phone] Fax:  [phone]     Social Determinants of Health (SDOH) Social History: SDOH Screenings   Tobacco Use: High Risk (09/22/2022)   SDOH Interventions:     Readmission Risk Interventions    09/24/2022    8:42 AM  Readmission Risk Prevention Plan  Transportation Screening Complete  HRI or [place] Complete  Social Work Consult for [place] Planning/Counseling Complete  Palliative Care Screening Not Applicable  Medication Review Press photographer) Complete

## 2022-09-24 NOTE — Inpatient Diabetes Management (Signed)
Inpatient Diabetes Program Recommendations  AACE/ADA: New Consensus Statement on Inpatient Glycemic Control  Target Ranges:  Prepandial:   less than 140 mg/dL      Peak postprandial:   less than 180 mg/dL (1-2 hours)      Critically ill patients:  140 - 180 mg/dL    Latest Reference Range & Units 09/23/22 07:56 09/23/22 11:16 09/23/22 16:41 09/23/22 21:08 09/24/22 08:00 09/24/22 11:51  Glucose-Capillary 70 - 99 mg/dL 219 (H) 297 (H) 325 (H) 374 (H) 335 (H) 372 (H)   Review of Glycemic Control  Diabetes history: DM2 Outpatient Diabetes medications: Tresiba 14 units QAM, Novolog 1-4 units TID with meals Current orders for Inpatient glycemic control: Semglee 10 units BID, Novolog 0-9 units TID with meals, Novolog 0-5 units QHS; Prednisone 40 mg QAM  Inpatient Diabetes Program Recommendations:    Insulin: Noted Semglee increased from 10 units daily to 10 units BID today.  If steroids are continued, please consider ordering Novolog 4 units TID with meals for meal coverage if patient eats at least 50% of meals.  Thanks, Barnie Alderman, RN, MSN, [place] Diabetes Coordinator Inpatient Diabetes Program [phone] (Team Pager from 8am to [place])

## 2022-09-24 NOTE — TOC Progression Note (Signed)
Transition of Care [facility]) - Progression Note    Patient Details  Name: Carol Valenzuela MRN: [id number] Date of Birth: [birth year]  Transition of Care [facility]) CM/SW Contact  Salome Arnt, [place] Phone Number: 09/24/2022, 3:47 PM  Clinical Narrative:  PT recommending home health. Discussed with sister who requests [facility]. Santiago Glad with [facility] accepts for HHPT. Will need order. TOC will follow.      Expected Discharge Plan:  (To be determined) Barriers to Discharge: Continued Medical Work up  Expected Discharge Plan and Services In-house Referral: Clinical Social Work     Living arrangements for the past 2 months: [place]: PT [place] Agency: [place] Date [place]: 09/24/22 Time [place]: [place] Representative spoke with at [place]: [place] ([place]) Interventions SDOH Screenings   Tobacco Use: High Risk (09/22/2022)    Readmission Risk Interventions    09/24/2022    8:42 AM  Readmission Risk Prevention Plan  Transportation Screening Complete  HRI or [place] Complete  Social Work Consult for [place] Planning/Counseling Complete  Palliative Care Screening Not Applicable  Medication Review Press photographer) Complete

## 2022-09-24 NOTE — Plan of Care (Signed)
  Problem: Acute Rehab PT Goals(only PT should resolve) Goal: Pt Will Go Supine/Side To Sit Outcome: Progressing Flowsheets (Taken 09/24/2022 1550) Pt will go Supine/Side to Sit:  Independently  with modified independence Goal: Patient Will Transfer Sit To/From Stand Outcome: Progressing Flowsheets (Taken 09/24/2022 1550) Patient will transfer sit to/from stand:  Independently  with modified independence Goal: Pt Will Transfer Bed To Chair/Chair To Bed Outcome: Progressing Flowsheets (Taken 09/24/2022 1550) Pt will Transfer Bed to Chair/Chair to Bed:  with modified independence  Independently Goal: Pt Will Ambulate Outcome: Progressing Flowsheets (Taken 09/24/2022 1550) Pt will Ambulate:  75 feet  with modified independence  with least restrictive assistive device   3:51 PM, 09/24/22 Lonell Grandchild, MPT Physical Therapist with [facility] [phone] office [phone] mobile phone

## 2022-09-25 DIAGNOSIS — I5033 Acute on chronic diastolic (congestive) heart failure: Secondary | ICD-10-CM | POA: Diagnosis not present

## 2022-09-25 LAB — COMPREHENSIVE METABOLIC PANEL
ALT: 14 U/L (ref 0–44)
AST: 19 U/L (ref 15–41)
Albumin: 2.8 g/dL — ABNORMAL LOW (ref 3.5–5.0)
Alkaline Phosphatase: 54 U/L (ref 38–126)
Anion gap: 9 (ref 5–15)
BUN: 58 mg/dL — ABNORMAL HIGH (ref 8–23)
CO2: 21 mmol/L — ABNORMAL LOW (ref 22–32)
Calcium: 6.9 mg/dL — ABNORMAL LOW (ref 8.9–10.3)
Chloride: 101 mmol/L (ref 98–111)
Creatinine, Ser: 2.83 mg/dL — ABNORMAL HIGH (ref 0.44–1.00)
GFR, Estimated: 17 mL/min — ABNORMAL LOW (ref >60)
Glucose, Bld: 312 mg/dL — ABNORMAL HIGH (ref 70–99)
Potassium: 3.9 mmol/L (ref 3.5–5.1)
Sodium: 131 mmol/L — ABNORMAL LOW (ref 135–145)
Total Bilirubin: 0.4 mg/dL (ref 0.3–1.2)
Total Protein: 5.2 g/dL — ABNORMAL LOW (ref 6.5–8.1)

## 2022-09-25 LAB — MAGNESIUM: Magnesium: 1.9 mg/dL (ref 1.7–2.4)

## 2022-09-25 LAB — GLUCOSE, CAPILLARY
Glucose-Capillary: 323 mg/dL — ABNORMAL HIGH (ref 70–99)
Glucose-Capillary: 261 mg/dL — ABNORMAL HIGH (ref 70–99)
Glucose-Capillary: 115 mg/dL — ABNORMAL HIGH (ref 70–99)
Glucose-Capillary: 188 mg/dL — ABNORMAL HIGH (ref 70–99)
Glucose-Capillary: 172 mg/dL — ABNORMAL HIGH (ref 70–99)

## 2022-09-25 LAB — BRAIN NATRIURETIC PEPTIDE: B Natriuretic Peptide: 300 pg/mL — ABNORMAL HIGH (ref 0.0–100.0)

## 2022-09-25 MED ORDER — BUPROPION HCL ER (XL) 150 MG PO TB24
150.0000 mg | ORAL_TABLET | Freq: Every morning | ORAL | Status: DC
  Administered 2022-09-25 – 2022-09-26 (×2): 150 mg via ORAL
  Filled 2022-09-25 (×2): qty 1

## 2022-09-25 MED ORDER — CALCIUM CARB-CHOLECALCIFEROL 500-10 MG-MCG PO CHEW: CHEWABLE_TABLET | Freq: Every day | ORAL | Status: DC

## 2022-09-25 MED ORDER — LABETALOL HCL 200 MG PO TABS
200.0000 mg | ORAL_TABLET | Freq: Two times a day (BID) | ORAL | Status: DC
  Administered 2022-09-25 – 2022-09-26 (×2): 200 mg via ORAL
  Filled 2022-09-25 (×2): qty 1

## 2022-09-25 MED ORDER — ATORVASTATIN CALCIUM 40 MG PO TABS
40.0000 mg | ORAL_TABLET | Freq: Every day | ORAL | Status: DC
  Administered 2022-09-26: 40 mg via ORAL
  Filled 2022-09-25: qty 1

## 2022-09-25 MED ORDER — DONEPEZIL HCL 5 MG PO TABS: 10.0000 mg | ORAL_TABLET | Freq: Every day | ORAL | Status: DC

## 2022-09-25 MED ORDER — INSULIN GLARGINE-YFGN 100 UNIT/ML ~~LOC~~ SOLN
15.0000 [IU] | Freq: Two times a day (BID) | SUBCUTANEOUS | Status: DC
  Administered 2022-09-25 (×2): 15 [IU] via SUBCUTANEOUS
  Filled 2022-09-25 (×5): qty 0.15

## 2022-09-25 MED ORDER — OYSTER SHELL CALCIUM/D3 500-5 MG-MCG PO TABS
1.0000 | ORAL_TABLET | Freq: Every day | ORAL | Status: DC
  Administered 2022-09-25: 1 via ORAL
  Filled 2022-09-25: qty 1

## 2022-09-25 MED ORDER — FUROSEMIDE 40 MG PO TABS
40.0000 mg | ORAL_TABLET | Freq: Every day | ORAL | Status: DC
  Administered 2022-09-26: 40 mg via ORAL
  Filled 2022-09-25: qty 1

## 2022-09-25 MED ORDER — METHIMAZOLE 5 MG PO TABS
5.0000 mg | ORAL_TABLET | ORAL | Status: DC
  Administered 2022-09-25 – 2022-09-26 (×2): 5 mg via ORAL
  Filled 2022-09-25 (×2): qty 1

## 2022-09-25 MED ORDER — CALCIUM GLUCONATE-NACL 1-0.675 GM/50ML-% IV SOLN
1.0000 g | Freq: Once | INTRAVENOUS | Status: AC
  Administered 2022-09-25: 1000 mg via INTRAVENOUS
  Filled 2022-09-25 (×2): qty 50

## 2022-09-25 MED ORDER — ALBUMIN HUMAN 25 % IV SOLN
25.0000 g | Freq: Once | INTRAVENOUS | Status: AC
  Administered 2022-09-25: 25 g via INTRAVENOUS
  Filled 2022-09-25: qty 100

## 2022-09-25 NOTE — Progress Notes (Signed)
PROGRESS NOTE    Patient: Carol Valenzuela                            PCP: Deland Pretty, MD                    DOB: 09/26/1950            DOA: 09/22/2022 [id number]:[id number]             DOS: 09/25/2022, 12:32 PM   LOS: 3 days   Date of Service: The patient was seen and examined on 09/25/2022  Subjective:   The patient was seen and examined this morning. Pleasantly confused in no acute distress Hypertensive Satting 97% on room air  Discussed with the sister at bedside due to hyperglycemia will discontinue steroids, due to elevated BUN/creatinine, will discontinue IV Lasix Will monitor for today Planning to discharge home in a.m. with home health She is agreeable to above plan   Brief Narrative:   Carol Valenzuela is a 72 y.o. female with medical history significant for atrial fibrillation, hypertension, diabetes mellitus, CKD 3, Dementia. Patient has dementia, she is able to answer simple questions appropriately, but details are provided by her Carol Valenzuela who is at bedside. Patient has had ongoing leg swelling since 12/21, Lasix dose was doubled for 3 days on the 21st, and then for 1 week on the 26th.  Subsequently she has been on 20 mg of Lasix, and subsequently leg swelling worsened.  She has had a cough over the past several days.  No difficulty breathing.  She has also had steady weight gain since November. Baseline she is able to walk without assistance or assistive devices, but due to leg swelling she has not been able to ambulate well. She is updated all her vaccines this year- including COVID, RSV, influenza.   ED Course: Sats >94% on room air.  Tmax 99.  Heart rate 60s to 70s.  Respiratory rate 17-27.  Chest x-ray shows cardiomegaly, vascular congestion, left lower lobe airspace disease suspicious for pneumonia. COVID test positive. BNP elevated 611. IV Lasix 40 mg x 1 given.  Hospitalist to admit for decompensated CHF.    Assessment & Plan:   Principal Problem:   Acute on  chronic diastolic (congestive) heart failure (HCC) Active Problems:   COVID-19 virus infection   Essential hypertension   Paroxysmal atrial fibrillation (HCC)   CKD (chronic kidney disease) stage 4, GFR 15-29 ml/min (HCC)   Diabetes mellitus (HCC)   Dementia (HCC)     Assessment and Plan: * Acute on chronic diastolic (congestive) heart failure (HCC) -Improved shortness of breath with exertion, improved lower extremity edema -Continue to sat 97% on room air -Extensive lower extremity edema -mild improvement   baseline weight is around 140s, POA: she is weighing 170 today.    Intake/Output Summary (Last 24 hours) at 09/25/2022 1229 Last data filed at 09/25/2022 0800 Gross per 24 hour  Intake 580 ml  Output --  Net 580 ml   Filed Weights   09/23/22 0500 09/24/22 0500 09/25/22 0500  Weight: 78.8 kg 77.9 kg 76.7 kg      Chest x-ray - new vascular congestion and patchy left lower lobe airspace disease suspicious for pneumonia. Small left pleural effusion.  -  BNP Elevated 611. -Continue IV Lasix 40 twice daily >> switching back to p.o. Lasix (elevated BUN/creatinine)  -Monitor renal function closely -Strict input-Output, daily weights, daily  BMP  COVID-19 virus infection -No respiratory distress, satting 96% on room air Chest x-ray showing ?  left lower lobe pneumonia.   - COVID test positive.  Received all COVID vaccines.   -Patient's Sister Carol Valenzuela is HCPOA, she has declined Paxlovid  -ProCal negative, but holding antibiotics  -Was on IV Solu-Medrol, will discontinue steroids-due to persistent hyperglycemia, Aside from cough no further acute respiratory distress   CKD (chronic kidney disease) stage 4, GFR 15-29 ml/min (HCC) Creatinine 2.26.  At baseline. -Monitor with diuresis Lab Results  Component Value Date   CREATININE 2.83 (H) 09/25/2022   CREATININE 2.53 (H) 09/24/2022   CREATININE 2.30 (H) 09/23/2022     Paroxysmal atrial fibrillation (HCC) Rate  controlled and on anticoagulation with Eliquis.   -Resume Eliquis, labetalol  Essential hypertension Hypertensive -Increasing labetalol 200 mg p.o. twice daily  -Resume hydralazine, labetalol, Imdur -As needed hydralazine for systolic greater than 379  Dementia (HCC) Mental status at baseline.  Pleasantly confused -She is able to communicate, but cannot recall chronic medical issues -Difficulty with ambulation and ADLs now  - She ambulates at baseline without assistance or assistive devices.   -Consulted PT OT --recommending home health  Diabetes mellitus ([place]) - HgbA1c 8.4 -Resume Tresiba at reduced dose 10 units daily(14u home dose). - SSI- S     -------------------------------------------------------------------------------------------------------------------------- Nutritional status:  The patient's BMI is: Body mass index is 27.29 kg/m. I agree with the assessment and plan as outlined b  Skin Assessment: I have examined the patient's skin and I agree with the wound assessment as performed by wound care team ---------------------------------------------------------------------------------------------------------------------------------------------  DVT prophylaxis:   apixaban (ELIQUIS) tablet 5 mg   Code Status:   Code Status: Full Code  Family Communication: Sister updated at bedside The above findings and plan of care has been discussed with patient (and family)  in detail,  they expressed understanding and agreement of above. -Advance care planning has been discussed.   Admission status:   Status is: Inpatient Remains inpatient appropriate because: Needing aggressive diuresing, PT OT evaluation   Disposition: From  - home with home health            Planning for discharge in 1 days  Procedures:   No admission procedures for hospital encounter.   Antimicrobials:  Anti-infectives (From admission, onward)    Start     Dose/Rate Route Frequency  Ordered Stop   09/22/22 1730  cefTRIAXone (ROCEPHIN) 1 g in sodium chloride 0.9 % 100 mL IVPB        1 g 200 mL/hr over 30 Minutes Intravenous  Once 09/22/22 1715 09/22/22 1803   09/22/22 1730  azithromycin (ZITHROMAX) tablet 500 mg        500 mg Oral  Once 09/22/22 1715 09/22/22 1747        Medication:   acidophilus  2 capsule Oral TID   apixaban  5 mg Oral BID   ascorbic acid  500 mg Oral Daily   [START ON 09/26/2022] atorvastatin  40 mg Oral Daily   buPROPion  150 mg Oral q AM   calcium-vitamin D  1 tablet Oral Q lunch   donepezil  10 mg Oral QHS   furosemide  40 mg Intravenous Q12H   hydrALAZINE  100 mg Oral TID   insulin aspart  0-20 Units Subcutaneous TID WC   insulin aspart  0-5 Units Subcutaneous QHS   insulin glargine-yfgn  15 Units Subcutaneous BID   isosorbide dinitrate  60 mg Oral TID  labetalol  100 mg Oral BID   methimazole  5 mg Oral Once per day on Mon Tue Wed Thu   zinc sulfate  220 mg Oral Daily    acetaminophen **OR** acetaminophen, hydrALAZINE, ondansetron **OR** ondansetron (ZOFRAN) IV, polyethylene glycol   Objective:   Vitals:   09/24/22 2114 09/25/22 0334 09/25/22 0500 09/25/22 0924  BP: (!) 150/75 (!) 150/66  (!) 175/88  Pulse: 62 (!) 59  66  Resp: 18 18  18   Temp: 98.1 F (36.7 C) (!) 97.5 F (36.4 C)    TempSrc:      SpO2: 95% 96%  97%  Weight:   76.7 kg   Height:        Intake/Output Summary (Last 24 hours) at 09/25/2022 1232 Last data filed at 09/25/2022 0800 Gross per 24 hour  Intake 580 ml  Output --  Net 580 ml   Filed Weights   09/23/22 0500 09/24/22 0500 09/25/22 0500  Weight: 78.8 kg 77.9 kg 76.7 kg     Physical examination:    General:  Pleasantly confused AAO x 2  cooperative, no distress;   HEENT:  Normocephalic, PERRL, otherwise with in Normal limits   Neuro:  CNII-XII intact. , normal motor and sensation, reflexes intact   Lungs:   Clear to auscultation BL, Respirations unlabored,  No wheezes / crackles  Cardio:     S1/S2, RRR, No murmure, No Rubs or Gallops   Abdomen:  Soft, non-tender, bowel sounds active all four quadrants, no guarding or peritoneal signs.  Muscular  skeletal:  Limited exam -global generalized weaknesses - in bed, able to move all 4 extremities,   2+ pulses,  symmetric, +2  pitting edema  Skin:  Dry, warm to touch, negative for any Rashes,  Wounds: Please see nursing documentation         ------------------------------------------------------------------------------------------------------------------------------------------    LABs:     Latest Ref Rng & Units 09/22/2022    1:05 PM 06/26/2022    9:11 PM 05/02/2022    1:45 PM  CBC  WBC 4.0 - 10.5 K/uL 3.9  11.7    Hemoglobin 12.0 - 15.0 g/dL 8.6  9.9  9.9   Hematocrit 36.0 - 46.0 % 26.3  29.6  29.0   Platelets 150 - 400 K/uL 170  230        Latest Ref Rng & Units 09/25/2022    3:45 AM 09/24/2022    7:08 AM 09/23/2022    5:18 AM  CMP  Glucose 70 - 99 mg/dL 312  350  198   BUN 8 - 23 mg/dL 58  52  47   Creatinine 0.44 - 1.00 mg/dL 2.83  2.53  2.30   Sodium 135 - 145 mmol/L 131  130  133   Potassium 3.5 - 5.1 mmol/L 3.9  4.3  4.5   Chloride 98 - 111 mmol/L 101  101  106   CO2 22 - 32 mmol/L 21  19  20    Calcium 8.9 - 10.3 mg/dL 6.9  7.1  7.2   Total Protein 6.5 - 8.1 g/dL 5.2  5.4  5.1   Total Bilirubin 0.3 - 1.2 mg/dL 0.4  0.7  0.5   Alkaline Phos 38 - 126 U/L 54  38  36   AST 15 - 41 U/L 19  17  17    ALT 0 - 44 U/L 14  16  16         Micro Results Recent Results (from the past 240  hour(s))  Resp panel by RT-PCR (RSV, Flu A&B, Covid) Anterior Nasal Swab     Status: Abnormal   Collection Time: 09/22/22  2:12 PM   Specimen: Anterior Nasal Swab  Result Value Ref Range Status   SARS Coronavirus 2 by RT PCR POSITIVE (A) NEGATIVE Final    Comment: (NOTE) SARS-CoV-2 target nucleic acids are DETECTED.  The SARS-CoV-2 RNA is generally detectable in upper respiratory specimens during the acute phase of infection.  Positive results are indicative of the presence of the identified virus, but do not rule out bacterial infection or co-infection with other pathogens not detected by the test. Clinical correlation with patient history and other diagnostic information is necessary to determine patient infection status. The expected result is Negative.  Fact Sheet for Patients: EntrepreneurPulse.com.au  Fact Sheet for Healthcare Providers: IncredibleEmployment.be  This test is not yet approved or cleared by the [place] FDA and  has been authorized for detection and/or diagnosis of SARS-CoV-2 by FDA under an Emergency Use Authorization (EUA).  This EUA will remain in effect (meaning this test can be used) for the duration of  the COVID-19 declaration under Section 564(b)(1) of the A ct, 21 U.S.C. section 360bbb-3(b)(1), unless the authorization is terminated or revoked sooner.     Influenza A by PCR NEGATIVE NEGATIVE Final   Influenza B by PCR NEGATIVE NEGATIVE Final    Comment: (NOTE) The Xpert Xpress SARS-CoV-2/FLU/RSV plus assay is intended as an aid in the diagnosis of influenza from Nasopharyngeal swab specimens and should not be used as a sole basis for treatment. Nasal washings and aspirates are unacceptable for Xpert Xpress SARS-CoV-2/FLU/RSV testing.  Fact Sheet for Patients: EntrepreneurPulse.com.au  Fact Sheet for Healthcare Providers: IncredibleEmployment.be  This test is not yet approved or cleared by the [place] FDA and has been authorized for detection and/or diagnosis of SARS-CoV-2 by FDA under an Emergency Use Authorization (EUA). This EUA will remain in effect (meaning this test can be used) for the duration of the COVID-19 declaration under Section 564(b)(1) of the Act, 21 U.S.C. section 360bbb-3(b)(1), unless the authorization is terminated or revoked.     Resp Syncytial Virus by PCR  NEGATIVE NEGATIVE Final    Comment: (NOTE) Fact Sheet for Patients: EntrepreneurPulse.com.au  Fact Sheet for Healthcare Providers: IncredibleEmployment.be  This test is not yet approved or cleared by the [place] FDA and has been authorized for detection and/or diagnosis of SARS-CoV-2 by FDA under an Emergency Use Authorization (EUA). This EUA will remain in effect (meaning this test can be used) for the duration of the COVID-19 declaration under Section 564(b)(1) of the Act, 21 U.S.C. section 360bbb-3(b)(1), unless the authorization is terminated or revoked.  Performed at [facility], [address], [place], [place] [address]     Radiology Reports No results found.  SIGNED: Deatra James, MD, FHM. FAAFP. Zacarias Pontes - [facility] hospital[facility] Time spent > 35 min.  In seeing, evaluating and examining the patient. Reviewing medical records, labs, drawn plan of care. [facility],  Pager (please use [email or web address] to page/ text) Please use Epic Secure Chat for non-urgent communication (7AM-7PM)  If 7PM-7AM, please contact night-coverage [email or web address], 09/25/2022, 12:32 PM

## 2022-09-26 DIAGNOSIS — I5033 Acute on chronic diastolic (congestive) heart failure: Secondary | ICD-10-CM | POA: Diagnosis not present

## 2022-09-26 LAB — GLUCOSE, CAPILLARY
Glucose-Capillary: 55 mg/dL — ABNORMAL LOW (ref 70–99)
Glucose-Capillary: 57 mg/dL — ABNORMAL LOW (ref 70–99)
Glucose-Capillary: 81 mg/dL (ref 70–99)

## 2022-09-26 LAB — COMPREHENSIVE METABOLIC PANEL
ALT: 17 U/L (ref 0–44)
AST: 21 U/L (ref 15–41)
Albumin: 2.6 g/dL — ABNORMAL LOW (ref 3.5–5.0)
Alkaline Phosphatase: 31 U/L — ABNORMAL LOW (ref 38–126)
Anion gap: 9 (ref 5–15)
BUN: 55 mg/dL — ABNORMAL HIGH (ref 8–23)
CO2: 22 mmol/L (ref 22–32)
Calcium: 7 mg/dL — ABNORMAL LOW (ref 8.9–10.3)
Chloride: 104 mmol/L (ref 98–111)
Creatinine, Ser: 2.61 mg/dL — ABNORMAL HIGH (ref 0.44–1.00)
GFR, Estimated: 19 mL/min — ABNORMAL LOW (ref >60)
Glucose, Bld: 76 mg/dL (ref 70–99)
Potassium: 3.6 mmol/L (ref 3.5–5.1)
Sodium: 135 mmol/L (ref 135–145)
Total Bilirubin: 0.3 mg/dL (ref 0.3–1.2)
Total Protein: 4.8 g/dL — ABNORMAL LOW (ref 6.5–8.1)

## 2022-09-26 LAB — MAGNESIUM: Magnesium: 1.7 mg/dL (ref 1.7–2.4)

## 2022-09-26 LAB — BRAIN NATRIURETIC PEPTIDE: B Natriuretic Peptide: 220 pg/mL — ABNORMAL HIGH (ref 0.0–100.0)

## 2022-09-26 MED ORDER — INSULIN GLARGINE-YFGN 100 UNIT/ML ~~LOC~~ SOLN: 10.0000 [IU] | Freq: Every day | SUBCUTANEOUS | Status: DC

## 2022-09-26 MED ORDER — ZINC SULFATE 220 (50 ZN) MG PO CAPS: 220.0000 mg | ORAL_CAPSULE | Freq: Every day | ORAL | 0 refills | Status: AC

## 2022-09-26 MED ORDER — TRESIBA FLEXTOUCH 100 UNIT/ML ~~LOC~~ SOPN: 10.0000 [IU] | PEN_INJECTOR | Freq: Every day | SUBCUTANEOUS | 2 refills | Status: AC

## 2022-09-26 MED ORDER — TORSEMIDE 20 MG PO TABS: 20.0000 mg | ORAL_TABLET | Freq: Every day | ORAL | 0 refills | Status: AC

## 2022-09-26 MED ORDER — ASCORBIC ACID 500 MG PO TABS: 500.0000 mg | ORAL_TABLET | Freq: Every day | ORAL | 0 refills | Status: AC

## 2022-09-26 NOTE — Progress Notes (Signed)
Pt alert, able to answer questions appropriately initially during assessment.  After sitting with pt for awhile, pt started to talk about being in a hotel in [place].  Pt was not in any distress during conversation.  Pt denies pain, SHOB, nausea.  No loose stools this shift, pt has been continent of bladder.  Edema decreased since admission.  Pt on room air.  VSS.

## 2022-09-26 NOTE — Discharge Summary (Signed)
Physician Discharge Summary  Carol Valenzuela NIO:[id number] DOB: 03-Mar-1951 DOA: 09/22/2022  PCP: Deland Pretty, MD  Admit date: 09/22/2022  Discharge date: 09/26/2022  Admitted From:Home  Disposition:  Home  Recommendations for Outpatient Follow-up:  Follow up with PCP in 1-2 weeks Please obtain BMP/CBC in one week Continue now on torsemide 20 mg daily and discontinue further Lasix Tresiba dose adjusted to 10 units daily given some hypoglycemia while inpatient Continue other home occasions as prior Encouraged to weigh daily  Home Health: None  Equipment/Devices: None  Discharge Condition:Stable  CODE STATUS: Full  Diet recommendation: Heart Healthy/carb modified  Brief/Interim Summary: Per HPI: Carol Valenzuela is a 72 y.o. female with medical history significant for atrial fibrillation, hypertension, diabetes mellitus, CKD 4, Dementia. Patient has dementia, she is able to answer simple questions appropriately, but details are provided by her Carol Valenzuela who is at bedside. Patient has had ongoing leg swelling since 12/21, Lasix dose was doubled for 3 days on the 21st, and then for 1 week on the 26th.  Subsequently she has been on 20 mg of Lasix, and subsequently leg swelling worsened.  She has had a cough over the past several days.  No difficulty breathing.  She has also had steady weight gain since November. Baseline she is able to walk without assistance or assistive devices, but due to leg swelling she has not been able to ambulate well. She is updated all her vaccines this year- including COVID, RSV, influenza.  -Patient was admitted for acute on chronic diastolic CHF exacerbation and appears to have diuresed adequately with weight at 164 pounds today.  She is overall feeling well with significant improvement in lower extremity edema and no further cough or difficulty breathing.  Her baseline weight is uncertain, but I have encouraged her to weigh daily and monitor carefully  while on torsemide 20 mg daily now.  She was also noted to have COVID-19 viral infection, but was asymptomatic from this and therefore received some IV Solu-Medrol, but no other medications.  Her creatinine has returned to near baseline as she does have CKD stage IV, but will need close follow-up outpatient given adjustments to diuretics.  No other acute events noted at this time and she is stable for discharge.  Discharge Diagnoses:  Principal Problem:   Acute on chronic diastolic (congestive) heart failure (HCC) Active Problems:   COVID-19 virus infection   Essential hypertension   Paroxysmal atrial fibrillation (HCC)   CKD (chronic kidney disease) stage 4, GFR 15-29 ml/min (HCC)   Diabetes mellitus (Carol Valenzuela)   Dementia (HCC)  Principal discharge diagnosis: Acute on chronic diastolic CHF exacerbation.  Discharge Instructions  Discharge Instructions     Diet - low sodium heart healthy   Complete by: As directed    Increase activity slowly   Complete by: As directed       Allergies as of 09/26/2022   No Known Allergies      Medication List     STOP taking these medications    furosemide 40 MG tablet Commonly known as: LASIX       TAKE these medications    apixaban 5 MG Tabs tablet Commonly known as: ELIQUIS Take 5 mg by mouth 2 (two) times daily.   ASCORBIC ACID PO Take 1 tablet by mouth See admin instructions. Vitamin C, unknown strength. 1 tablet every Monday, Wednesday, Friday at bedtime. What changed: Another medication with the same name was added. Make sure you understand how and when to  take each.   ascorbic acid 500 MG tablet Commonly known as: VITAMIN C Take 1 tablet (500 mg total) by mouth daily. Start taking on: September 27, 2022 What changed: You were already taking a medication with the same name, and this prescription was added. Make sure you understand how and when to take each.   atorvastatin 40 MG tablet Commonly known as: LIPITOR TAKE 1 TABLET  EVERY DAY What changed: when to take this   buPROPion 150 MG 24 hr tablet Commonly known as: WELLBUTRIN XL Take 150 mg by mouth in the morning.   CALCIUM + VITAMIN D3 PO Take 1 tablet by mouth daily with lunch.   donepezil 10 MG tablet Commonly known as: ARICEPT Take 10 mg by mouth at bedtime.   FerrouSul 325 (65 FE) MG tablet Generic drug: ferrous sulfate Take 325 mg by mouth See admin instructions. 325 mg every Monday, Wednesday, Friday at bedtime.   hydrALAZINE 100 MG tablet Commonly known as: APRESOLINE Take 1 tablet (100 mg total) by mouth 3 (three) times daily.   insulin aspart 100 UNIT/ML FlexPen Commonly known as: NOVOLOG Inject 1-4 Units into the skin 3 (three) times daily with meals. Sliding scale < 151 : 0 units 151-200 : 1 units 201-250 : 2 units 251-300 : 3 units 301-400=4 units   isosorbide dinitrate 30 MG tablet Commonly known as: ISORDIL Take 2 tablets (60 mg total) by mouth three times daily.   Kerendia 10 MG Tabs Generic drug: Finerenone Take 10 mg by mouth in the morning.   labetalol 100 MG tablet Commonly known as: NORMODYNE Take 100 mg by mouth 2 (two) times daily.   methimazole 5 MG tablet Commonly known as: TAPAZOLE Take 5 mg by mouth See admin instructions. 5 mg in the morning Monday, Tuesday, Wednesday, Thursday   ondansetron 8 MG disintegrating tablet Commonly known as: ZOFRAN-ODT Take 1 tablet (8 mg total) by mouth every 8 (eight) hours as needed for nausea or vomiting.   torsemide 20 MG tablet Commonly known as: DEMADEX Take 1 tablet (20 mg total) by mouth daily.   Tyler Aas FlexTouch 100 UNIT/ML FlexTouch Pen Generic drug: insulin degludec Inject 10 Units into the skin daily. What changed:  how much to take when to take this   zinc sulfate 220 (50 Zn) MG capsule Take 1 capsule (220 mg total) by mouth daily. Start taking on: September 27, 2022        Follow-up Information     Deland Pretty, MD. Schedule an appointment as  soon as possible for a visit in 1 week(s).   Specialty: Internal Medicine Contact information: [address] [place] [address] [phone]                No Known Allergies  Consultations: None   Procedures/Studies: DG Chest Port 1 View  Result Date: 09/22/2022 CLINICAL DATA:  Wheezing. EXAM: PORTABLE CHEST 1 VIEW COMPARISON:  CT 08/06/2022. Radiographs 06/26/2022. PET-CT 08/31/2022. FINDINGS: 1333 hours. The heart is enlarged, but stable. Aortic atherosclerosis noted. There is increased vascular congestion with new patchy left lower lobe airspace disease and a small left pleural effusion. No evidence of pneumothorax. No acute osseous findings are evident. Telemetry leads overlie the chest. IMPRESSION: Cardiomegaly with new vascular congestion and patchy left lower lobe airspace disease suspicious for pneumonia. Small left pleural effusion. Electronically Signed   By: Richardean Sale M.D.   On: 09/22/2022 13:47   NM PET Image Initial (PI) Skull Base To Thigh  Result Date: 09/01/2022 CLINICAL DATA:  Initial treatment strategy for lung nodule. EXAM: NUCLEAR MEDICINE PET SKULL BASE TO THIGH TECHNIQUE: 7.4 mCi F-18 FDG was injected intravenously. Full-ring PET imaging was performed from the skull base to thigh after the radiotracer. CT data was obtained and used for attenuation correction and anatomic localization. Fasting blood glucose: 218 mg/dl COMPARISON:  Lung cancer screening CT from 08/06/22. FINDINGS: Mediastinal blood pool activity: SUV max 1.90 Liver activity: SUV max NA NECK: No hypermetabolic lymph nodes in the neck. Incidental CT findings: None. CHEST: No tracer avid axillary, supraclavicular, mediastinal, or hilar lymph nodes. Left lower lobe lung nodule adjacent to the left heart border is again seen measuring 1.4 cm with SUV max 1.32, image 51/7. Incidental CT findings: Mild centrilobular and paraseptal emphysema. Small bilateral pleural effusions are  noted, right greater than left. Heart size is enlarged. Aortic atherosclerosis and coronary artery calcifications. ABDOMEN/PELVIS: No abnormal hypermetabolic activity within the liver, pancreas, adrenal glands, or spleen. No hypermetabolic lymph nodes in the abdomen or pelvis. Incidental CT findings: Exam detail within the abdomen and pelvis is significantly diminished secondary to diffuse anasarca with hazy edema throughout the mesentery and peritoneal fat. There is also diffuse body wall edema. Aortic atherosclerosis. Multiple calcified uterine fibroids identified. SKELETON: No focal hypermetabolic activity to suggest skeletal metastasis. Incidental CT findings: Diffuse body wall edema is identified involving the thoracic, abdominal, pelvic and extremity regions. Imaging findings are compatible with diffuse anasarca. IMPRESSION: 1. Mild FDG uptake associated with the left lower lobe lung nodule. SUV max equals 1.32. This remains nonspecific, but is suggestive of either indolent benign process versus low-grade pulmonary neoplasm such as pulmonary adenocarcinoma. 2. No signs of tracer avid metastatic disease. 3. Small bilateral pleural effusions, and diffuse anasarca. Correlate for signs/symptoms of congestive heart failure. 4. Coronary artery calcifications. 5. Aortic Atherosclerosis (ICD10-I70.0) and Emphysema (ICD10-J43.9). Electronically Signed   By: Kerby Moors M.D.   On: 09/01/2022 15:13     Discharge Exam: Vitals:   09/25/22 2044 09/26/22 0449  BP: (!) 164/77 128/71  Pulse: 69 (!) 59  Resp: 20 18  Temp: 97.8 F (36.6 C) 98.1 F (36.7 C)  SpO2: 96% 98%   Vitals:   09/25/22 1428 09/25/22 2044 09/26/22 0449 09/26/22 0500  BP: 139/81 (!) 164/77 128/71   Pulse: 71 69 (!) 59   Resp: 18 20 18    Temp: 97.6 F (36.4 C) 97.8 F (36.6 C) 98.1 F (36.7 C)   TempSrc: Oral     SpO2: 96% 96% 98%   Weight:    74.8 kg  Height:        General: Pt is alert, awake, not in acute  distress Cardiovascular: RRR, S1/S2 +, no rubs, no gallops Respiratory: CTA bilaterally, no wheezing, no rhonchi Abdominal: Soft, NT, ND, bowel sounds + Extremities: no edema, no cyanosis    The results of significant diagnostics from this hospitalization (including imaging, microbiology, ancillary and laboratory) are listed below for reference.     Microbiology: Recent Results (from the past 240 hour(s))  Resp panel by RT-PCR (RSV, Flu A&B, Covid) Anterior Nasal Swab     Status: Abnormal   Collection Time: 09/22/22  2:12 PM   Specimen: Anterior Nasal Swab  Result Value Ref Range Status   SARS Coronavirus 2 by RT PCR POSITIVE (A) NEGATIVE Final    Comment: (NOTE) SARS-CoV-2 target nucleic acids are DETECTED.  The SARS-CoV-2 RNA is generally detectable in upper respiratory specimens during the acute phase of infection.  Positive results are indicative of the presence of the identified virus, but do not rule out bacterial infection or co-infection with other pathogens not detected by the test. Clinical correlation with patient history and other diagnostic information is necessary to determine patient infection status. The expected result is Negative.  Fact Sheet for Patients: EntrepreneurPulse.com.au  Fact Sheet for Healthcare Providers: IncredibleEmployment.be  This test is not yet approved or cleared by the [place] FDA and  has been authorized for detection and/or diagnosis of SARS-CoV-2 by FDA under an Emergency Use Authorization (EUA).  This EUA will remain in effect (meaning this test can be used) for the duration of  the COVID-19 declaration under Section 564(b)(1) of the A ct, 21 U.S.C. section 360bbb-3(b)(1), unless the authorization is terminated or revoked sooner.     Influenza A by PCR NEGATIVE NEGATIVE Final   Influenza B by PCR NEGATIVE NEGATIVE Final    Comment: (NOTE) The Xpert Xpress SARS-CoV-2/FLU/RSV plus assay is  intended as an aid in the diagnosis of influenza from Nasopharyngeal swab specimens and should not be used as a sole basis for treatment. Nasal washings and aspirates are unacceptable for Xpert Xpress SARS-CoV-2/FLU/RSV testing.  Fact Sheet for Patients: EntrepreneurPulse.com.au  Fact Sheet for Healthcare Providers: IncredibleEmployment.be  This test is not yet approved or cleared by the [place] FDA and has been authorized for detection and/or diagnosis of SARS-CoV-2 by FDA under an Emergency Use Authorization (EUA). This EUA will remain in effect (meaning this test can be used) for the duration of the COVID-19 declaration under Section 564(b)(1) of the Act, 21 U.S.C. section 360bbb-3(b)(1), unless the authorization is terminated or revoked.     Resp Syncytial Virus by PCR NEGATIVE NEGATIVE Final    Comment: (NOTE) Fact Sheet for Patients: EntrepreneurPulse.com.au  Fact Sheet for Healthcare Providers: IncredibleEmployment.be  This test is not yet approved or cleared by the [place] FDA and has been authorized for detection and/or diagnosis of SARS-CoV-2 by FDA under an Emergency Use Authorization (EUA). This EUA will remain in effect (meaning this test can be used) for the duration of the COVID-19 declaration under Section 564(b)(1) of the Act, 21 U.S.C. section 360bbb-3(b)(1), unless the authorization is terminated or revoked.  Performed at [facility], [address] [place], [place] [address]      Labs: BNP (last 3 results) Recent Labs    09/24/22 0708 09/25/22 0345 09/26/22 0341  BNP 365.0* 300.0* 735.3*   Basic Metabolic Panel: Recent Labs  Lab 09/22/22 1305 09/23/22 0518 09/24/22 0708 09/25/22 0345 09/26/22 0341  NA 132* 133* 130* 131* 135  K 4.0 4.5 4.3 3.9 3.6  CL 103 106 101 101 104  CO2 21* 20* 19* 21* 22  GLUCOSE 166* 198* 350* 312* 76  BUN 46* 47* 52* 58* 55*   CREATININE 2.26* 2.30* 2.53* 2.83* 2.61*  CALCIUM 7.6* 7.2* 7.1* 6.9* 7.0*  MG 2.0 1.8 1.9 1.9 1.7  PHOS  --  3.5  --   --   --    Liver Function Tests: Recent Labs  Lab 09/23/22 0518 09/24/22 0708 09/25/22 0345 09/26/22 0341  AST 17 17 19 21   ALT 16 16 14 17   ALKPHOS 36* 38 54 31*  BILITOT 0.5 0.7 0.4 0.3  PROT 5.1* 5.4* 5.2* 4.8*  ALBUMIN 2.5* 2.7* 2.8* 2.6*   No results for input(s): "LIPASE", "AMYLASE" in the last 168 hours. No results for input(s): "AMMONIA" in the last 168 hours. CBC: Recent Labs  Lab 09/22/22 1305  WBC 3.9*  HGB 8.6*  HCT 26.3*  MCV 95.6  PLT 170   Cardiac Enzymes: No results for input(s): "CKTOTAL", "CKMB", "CKMBINDEX", "TROPONINI" in the last 168 hours. BNP: Invalid input(s): "POCBNP" CBG: Recent Labs  Lab 09/25/22 1636 09/25/22 2044 09/26/22 0736 09/26/22 0841 09/26/22 0918  GLUCAP 188* 172* 55* 57* 81   D-Dimer No results for input(s): "DDIMER" in the last 72 hours. Hgb A1c No results for input(s): "HGBA1C" in the last 72 hours. Lipid Profile No results for input(s): "CHOL", "HDL", "LDLCALC", "TRIG", "CHOLHDL", "LDLDIRECT" in the last 72 hours. Thyroid function studies No results for input(s): "TSH", "T4TOTAL", "T3FREE", "THYROIDAB" in the last 72 hours.  Invalid input(s): "FREET3" Anemia work up No results for input(s): "VITAMINB12", "FOLATE", "FERRITIN", "TIBC", "IRON", "RETICCTPCT" in the last 72 hours. Urinalysis    Component Value Date/Time   COLORURINE YELLOW 06/26/2022 2041   APPEARANCEUR HAZY (A) 06/26/2022 2041   LABSPEC 1.017 06/26/2022 2041   PHURINE 5.0 06/26/2022 2041   GLUCOSEU 50 (A) 06/26/2022 2041   HGBUR NEGATIVE 06/26/2022 2041   [place] NEGATIVE 06/26/2022 2041   KETONESUR NEGATIVE 06/26/2022 2041   PROTEINUR >=300 (A) 06/26/2022 2041   NITRITE NEGATIVE 06/26/2022 2041   LEUKOCYTESUR NEGATIVE 06/26/2022 2041   Sepsis Labs Recent Labs  Lab 09/22/22 1305  WBC 3.9*   Microbiology Recent  Results (from the past 240 hour(s))  Resp panel by RT-PCR (RSV, Flu A&B, Covid) Anterior Nasal Swab     Status: Abnormal   Collection Time: 09/22/22  2:12 PM   Specimen: Anterior Nasal Swab  Result Value Ref Range Status   SARS Coronavirus 2 by RT PCR POSITIVE (A) NEGATIVE Final    Comment: (NOTE) SARS-CoV-2 target nucleic acids are DETECTED.  The SARS-CoV-2 RNA is generally detectable in upper respiratory specimens during the acute phase of infection. Positive results are indicative of the presence of the identified virus, but do not rule out bacterial infection or co-infection with other pathogens not detected by the test. Clinical correlation with patient history and other diagnostic information is necessary to determine patient infection status. The expected result is Negative.  Fact Sheet for Patients: EntrepreneurPulse.com.au  Fact Sheet for Healthcare Providers: IncredibleEmployment.be  This test is not yet approved or cleared by the [place] FDA and  has been authorized for detection and/or diagnosis of SARS-CoV-2 by FDA under an Emergency Use Authorization (EUA).  This EUA will remain in effect (meaning this test can be used) for the duration of  the COVID-19 declaration under Section 564(b)(1) of the A ct, 21 U.S.C. section 360bbb-3(b)(1), unless the authorization is terminated or revoked sooner.     Influenza A by PCR NEGATIVE NEGATIVE Final   Influenza B by PCR NEGATIVE NEGATIVE Final    Comment: (NOTE) The Xpert Xpress SARS-CoV-2/FLU/RSV plus assay is intended as an aid in the diagnosis of influenza from Nasopharyngeal swab specimens and should not be used as a sole basis for treatment. Nasal washings and aspirates are unacceptable for Xpert Xpress SARS-CoV-2/FLU/RSV testing.  Fact Sheet for Patients: EntrepreneurPulse.com.au  Fact Sheet for Healthcare  Providers: IncredibleEmployment.be  This test is not yet approved or cleared by the [place] FDA and has been authorized for detection and/or diagnosis of SARS-CoV-2 by FDA under an Emergency Use Authorization (EUA). This EUA will remain in effect (meaning this test can be used) for the duration of the COVID-19 declaration under Section 564(b)(1) of the Act, 21 U.S.C. section 360bbb-3(b)(1), unless the authorization is terminated or revoked.  Resp Syncytial Virus by PCR NEGATIVE NEGATIVE Final    Comment: (NOTE) Fact Sheet for Patients: EntrepreneurPulse.com.au  Fact Sheet for Healthcare Providers: IncredibleEmployment.be  This test is not yet approved or cleared by the [place] FDA and has been authorized for detection and/or diagnosis of SARS-CoV-2 by FDA under an Emergency Use Authorization (EUA). This EUA will remain in effect (meaning this test can be used) for the duration of the COVID-19 declaration under Section 564(b)(1) of the Act, 21 U.S.C. section 360bbb-3(b)(1), unless the authorization is terminated or revoked.  Performed at [facility], [address], [place],  [address]      Time coordinating discharge: 35 minutes  SIGNED:   Rodena Goldmann, DO [facility] 09/26/2022, 9:48 AM  If 7PM-7AM, please contact night-coverage [email or web address]

## 2022-09-26 NOTE — Evaluation (Signed)
Occupational Therapy Evaluation Patient Details Name: Carol Valenzuela MRN: [id number] DOB: 1951/01/19 Today's Date: 09/26/2022   History of Present Illness Carol Valenzuela is a 71 y.o. female with medical history significant for atrial fibrillation, hypertension, diabetes mellitus, CKD 3, Dementia.  Patient has dementia, she is able to answer simple questions appropriately, but details are provided by her Carol Valenzuela who is at bedside.  Patient has had ongoing leg swelling since 12/21, Lasix dose was doubled for 3 days on the 21st, and then for 1 week on the 26th.  Subsequently she has been on 20 mg of Lasix, and subsequently leg swelling worsened.  She has had a cough over the past several days.  No difficulty breathing.  She has also had steady weight gain since November.  Baseline she is able to walk without assistance or assistive devices, but due to leg swelling she has not been able to ambulate well.  She is updated all her vaccines this year- including COVID, RSV, influenza.   Clinical Impression   Pt agreeable to OT evaluation. Pt is generally weak and operating a level of modified independence. Pt was able to don her shoes and ambulate to the toilet and back without assist. Sister present and reporting that she would like pt to have home health OT for general strengthening and safety. Pt is not recommended for  further acute OT services and will be discharged to care of nursing staff for remaining length of stay.       Recommendations for follow up therapy are one component of a multi-disciplinary discharge planning process, led by the attending physician.  Recommendations may be updated based on patient status, additional functional criteria and insurance authorization.   Follow Up Recommendations  Home health OT     Assistance Recommended at Discharge PRN  Patient can return home with the following Assist for transportation;Help with stairs or ramp for entrance    Functional Status  Assessment  Patient has had a recent decline in their functional status and demonstrates the ability to make significant improvements in function in a reasonable and predictable amount of time.  Equipment Recommendations  None recommended by OT           Precautions / Restrictions Precautions Precautions: Fall Restrictions Weight Bearing Restrictions: No      Mobility Bed Mobility Overal bed mobility: Modified Independent                  Transfers Overall transfer level: Modified independent                 General transfer comment: Mildly labored transfer to toilet and back.      Balance Overall balance assessment: Mild deficits observed, not formally tested                                         ADL either performed or assessed with clinical judgement   ADL Overall ADL's : Modified independent                                       General ADL Comments: Mild labored effort. Generally weak.     Vision Baseline Vision/History: 1 Wears glasses Ability to See in Adequate Light: 0 Adequate Patient Visual Report: No change from baseline Vision Assessment?: No  apparent visual deficits                Pertinent Vitals/Pain Pain Assessment Pain Assessment: Faces Faces Pain Scale: No hurt     Hand Dominance Right   Extremity/Trunk Assessment Upper Extremity Assessment Upper Extremity Assessment: Generalized weakness   Lower Extremity Assessment Lower Extremity Assessment: Defer to PT evaluation   Cervical / Trunk Assessment Cervical / Trunk Assessment: Normal   Communication Communication Communication: No difficulties   Cognition Arousal/Alertness: Awake/alert Behavior During Therapy: WFL for tasks assessed/performed Overall Cognitive Status: History of cognitive impairments - at baseline                                                        Home Living Family/patient expects  to be discharged to:: Private residence Living Arrangements: Other relatives Available Help at Discharge: Family;Available 24 hours/day Type of Home: House Home Access: Ramped entrance   Entrance Stairs-Rails: None Home Layout: Two level;Able to live on main level with bedroom/bathroom;Full bath on main level Alternate Level Stairs-Number of Steps: Patient does not go upstairs   Bathroom Shower/Tub: Occupational psychologist: Standard Bathroom Accessibility: Yes   Home Equipment: Conservation officer, nature (2 wheels);Cane - single point;BSC/3in1;Shower seat;Grab bars - tub/shower   Additional Comments: Taken via chart review.      Prior Functioning/Environment Prior Level of Function : Needs assist       Physical Assist : ADLs (physical)   ADLs (physical): IADLs Mobility Comments: household and short distanced community ambulator ADLs Comments: Pt is independent for ADL's; Pt's sister accompanies her during community IADL's.                          End of Session    Activity Tolerance: Patient tolerated treatment well Patient left: in chair;with family/visitor present;with call bell/phone within reach;with nursing/sitter in room  OT Visit Diagnosis: Unsteadiness on feet (R26.81);Muscle weakness (generalized) (M62.81)                Time: 3662-9476 OT Time Calculation (min): 8 min Charges:  OT General Charges $OT Visit: 1 Visit OT Evaluation $OT Eval Low Complexity: 1 Low  Carol Valenzuela OT, MOT  Larey Seat 09/26/2022, 9:40 AM

## 2022-09-26 NOTE — Progress Notes (Signed)
Hypoglycemic Event  CBG: 55  Treatment: 4 oz juice/soda  Symptoms: None  Follow-up CBG: [id number] CBG Result:57  Treatment: 4 oz juice/soda  Symptoms: None  Follow-up CBG: [id number] CBG Result:81  Possible Reasons for Event: Inadequate meal intake  Comments/MD notified:Dr. Manuella Ghazi made aware, patient needed frequent reminders and encouragement to drink soda. No juice available on unit.    [name]

## 2022-09-26 NOTE — Inpatient Diabetes Management (Signed)
Inpatient Diabetes Program Recommendations  AACE/ADA: New Consensus Statement on Inpatient Glycemic Control  Target Ranges:  Prepandial:   less than 140 mg/dL      Peak postprandial:   less than 180 mg/dL (1-2 hours)      Critically ill patients:  140 - 180 mg/dL    Latest Reference Range & Units 09/26/22 07:36 09/26/22 08:41 09/26/22 09:18  Glucose-Capillary 70 - 99 mg/dL 55 (L) 57 (L) 81    Latest Reference Range & Units 09/22/22 12:00  Hemoglobin A1C 4.8 - 5.6 % 8.4 (H)   Review of Glycemic Control  Diabetes history: DM2 Outpatient Diabetes medications: Tresiba 14 units QAM, Novolog 1-4 units TID with meals Current orders for Inpatient glycemic control: Semglee 15 units BID, Novolog 0-20 units TID with meals, Novolog 0-5 units QHS  Inpatient Diabetes Program Recommendations:    Insulin: Steroids discontinued and fasting CBG 55 mg/dl.  Please consider decreasing Semglee to 15 units QHS.  Thanks, Barnie Alderman, RN, MSN, [place] Diabetes Coordinator Inpatient Diabetes Program [phone] (Team Pager from 8am to [place])

## 2022-09-26 NOTE — TOC Transition Note (Signed)
Transition of Care [facility]) - CM/SW Discharge Note   Patient Details  Name: CARLIN MAMONE MRN: [id number] Date of Birth: 06/10/1951  [facility] CM/SW Contact:  Shade Flood, LCSW Phone Number: 09/26/2022, 10:33 AM   Clinical Narrative:     Pt stable for dc home with [facility] today per MD. Updated Santiago Glad at [facility] and they will follow up with pt at home.  There are no other TOC needs for dc.  Final next level of care: [place] Barriers to Discharge: Barriers Resolved   Patient Goals and CMS Choice   Choice offered to / list presented to : Sibling  Discharge Placement                         Discharge Plan and Services Additional resources added to the After Visit Summary for   In-house Referral: Clinical Social Work                        HH Arranged: OT [facility] Agency: [place] Date Peters: 09/24/22 Time [place]: [id number] Representative spoke with at [place]: [place] Interventions SDOH Screenings   Tobacco Use: High Risk (09/22/2022)     Readmission Risk Interventions    09/24/2022    8:42 AM  Readmission Risk Prevention Plan  Transportation Screening Complete  HRI or [place] Complete  Social Work Consult for [place] Planning/Counseling Complete  Palliative Care Screening Not Applicable  Medication Review Press photographer) Complete

## 2022-09-28 DIAGNOSIS — Z7901 Long term (current) use of anticoagulants: Secondary | ICD-10-CM | POA: Diagnosis not present

## 2022-09-28 DIAGNOSIS — U071 COVID-19: Secondary | ICD-10-CM | POA: Diagnosis not present

## 2022-09-28 DIAGNOSIS — N184 Chronic kidney disease, stage 4 (severe): Secondary | ICD-10-CM | POA: Diagnosis not present

## 2022-09-28 DIAGNOSIS — E1122 Type 2 diabetes mellitus with diabetic chronic kidney disease: Secondary | ICD-10-CM | POA: Diagnosis not present

## 2022-09-28 DIAGNOSIS — F0390 Unspecified dementia without behavioral disturbance: Secondary | ICD-10-CM | POA: Diagnosis not present

## 2022-09-28 DIAGNOSIS — I130 Hypertensive heart and chronic kidney disease with heart failure and stage 1 through stage 4 chronic kidney disease, or unspecified chronic kidney disease: Secondary | ICD-10-CM | POA: Diagnosis not present

## 2022-09-28 DIAGNOSIS — I5033 Acute on chronic diastolic (congestive) heart failure: Secondary | ICD-10-CM | POA: Diagnosis not present

## 2022-09-28 DIAGNOSIS — Z9181 History of falling: Secondary | ICD-10-CM | POA: Diagnosis not present

## 2022-09-28 DIAGNOSIS — J432 Centrilobular emphysema: Secondary | ICD-10-CM | POA: Diagnosis not present

## 2022-09-28 DIAGNOSIS — J90 Pleural effusion, not elsewhere classified: Secondary | ICD-10-CM | POA: Diagnosis not present

## 2022-09-28 DIAGNOSIS — I480 Paroxysmal atrial fibrillation: Secondary | ICD-10-CM | POA: Diagnosis not present

## 2022-09-28 DIAGNOSIS — Z794 Long term (current) use of insulin: Secondary | ICD-10-CM | POA: Diagnosis not present

## 2022-09-28 DIAGNOSIS — I700 Atherosclerosis of aorta: Secondary | ICD-10-CM | POA: Diagnosis not present

## 2022-10-02 DIAGNOSIS — J432 Centrilobular emphysema: Secondary | ICD-10-CM | POA: Diagnosis not present

## 2022-10-02 DIAGNOSIS — E0590 Thyrotoxicosis, unspecified without thyrotoxic crisis or storm: Secondary | ICD-10-CM | POA: Diagnosis not present

## 2022-10-02 DIAGNOSIS — E1122 Type 2 diabetes mellitus with diabetic chronic kidney disease: Secondary | ICD-10-CM | POA: Diagnosis not present

## 2022-10-02 DIAGNOSIS — I5033 Acute on chronic diastolic (congestive) heart failure: Secondary | ICD-10-CM | POA: Diagnosis not present

## 2022-10-02 DIAGNOSIS — I130 Hypertensive heart and chronic kidney disease with heart failure and stage 1 through stage 4 chronic kidney disease, or unspecified chronic kidney disease: Secondary | ICD-10-CM | POA: Diagnosis not present

## 2022-10-02 DIAGNOSIS — N184 Chronic kidney disease, stage 4 (severe): Secondary | ICD-10-CM | POA: Diagnosis not present

## 2022-10-02 DIAGNOSIS — U071 COVID-19: Secondary | ICD-10-CM | POA: Diagnosis not present

## 2022-10-03 DIAGNOSIS — E1122 Type 2 diabetes mellitus with diabetic chronic kidney disease: Secondary | ICD-10-CM | POA: Diagnosis not present

## 2022-10-03 DIAGNOSIS — I130 Hypertensive heart and chronic kidney disease with heart failure and stage 1 through stage 4 chronic kidney disease, or unspecified chronic kidney disease: Secondary | ICD-10-CM | POA: Diagnosis not present

## 2022-10-03 DIAGNOSIS — U071 COVID-19: Secondary | ICD-10-CM | POA: Diagnosis not present

## 2022-10-03 DIAGNOSIS — N184 Chronic kidney disease, stage 4 (severe): Secondary | ICD-10-CM | POA: Diagnosis not present

## 2022-10-03 DIAGNOSIS — I5033 Acute on chronic diastolic (congestive) heart failure: Secondary | ICD-10-CM | POA: Diagnosis not present

## 2022-10-03 DIAGNOSIS — J432 Centrilobular emphysema: Secondary | ICD-10-CM | POA: Diagnosis not present

## 2022-10-04 ENCOUNTER — Encounter: Payer: Self-pay | Admitting: Cardiology

## 2022-10-04 ENCOUNTER — Telehealth: Payer: Self-pay

## 2022-10-04 ENCOUNTER — Ambulatory Visit: Payer: Medicare Other | Admitting: Cardiology

## 2022-10-04 VITALS — BP 169/82 | HR 63 | Resp 16 | Ht 66.0 in | Wt 166.0 lb

## 2022-10-04 DIAGNOSIS — I480 Paroxysmal atrial fibrillation: Secondary | ICD-10-CM

## 2022-10-04 DIAGNOSIS — I5032 Chronic diastolic (congestive) heart failure: Secondary | ICD-10-CM | POA: Diagnosis not present

## 2022-10-04 NOTE — Progress Notes (Signed)
Follow up visit  Subjective:   Carol Valenzuela, female    DOB: [birth year], 72 y.o.   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   December 0600 Average Systolic BP Level 459.97 mmHg Lowest Systolic BP Level 741 mmHg Highest Systolic BP Level 423 mmHg   January 1 - present Average Systolic BP Level 953.2 mmHg Lowest Systolic BP Level 023 mmHg Highest Systolic BP Level 343 mmHg   10/03/2022 Wednesday at 07:57 PM 167 / 90                10/03/2022 Wednesday at 09:43 AM 145 / 77                10/02/2022 Tuesday at 07:04 PM      168 / 94                10/02/2022 Tuesday at 09:54 AM      158 / 80                10/01/2022 Monday at 06:54 PM       152 / 78                10/01/2022 Monday at 10:08 AM       145 / 71                09/30/2022 Sunday at 07:29 PM        163 / 96                09/30/2022 Sunday at 02:09 PM        153 / 83                09/29/2022 Saturday at 07:26 PM      160 / 80                09/29/2022 Saturday at 09:54 AM      146 / 82                09/28/2022 Friday at 08:12 PM          17 2 / 88  Patient was recently hospitalized at [facility] in 09/2022 due to worsening leg swelling and cough.  She was treated for possible acute on chronic diastolic heart failure, and diuresed well.  She was also noted to have positive COVID test, for which she was treated with IV Solu-Medrol.   Patient is here with her sister today. It appears that patient has had rapid deterioration of recently diagnosed dementia. She is seeing a Neurologist for the same, notes not available to me. Patient's sister is planning to move in with her in  an assisted living facility close to her family near [place]. [facility] and also contemplating options for memory care unit if dementia were to get worse.     Current Outpatient Medications:    apixaban (ELIQUIS) 5 MG TABS tablet, Take 5 mg by mouth 2 (two) times daily., Disp: , Rfl:    ascorbic acid (VITAMIN C) 500 MG tablet, Take 1 tablet (500 mg total) by mouth daily., Disp: 30 tablet, Rfl: 0  atorvastatin (LIPITOR) 40 MG tablet, TAKE 1 TABLET EVERY DAY (Patient taking differently: Take 40 mg by mouth daily with supper.), Disp: 90 tablet, Rfl: 2   buPROPion (WELLBUTRIN XL) 150 MG 24 hr tablet, Take 150 mg by mouth in the morning., Disp: , Rfl:    Calcium Carb-Cholecalciferol (CALCIUM + VITAMIN D3 PO), Take 1 tablet by mouth daily with lunch., Disp: , Rfl:    donepezil (ARICEPT) 10 MG tablet, Take 10 mg by mouth at bedtime., Disp: , Rfl:    ferrous sulfate (FERROUSUL) 325 (65 FE) MG tablet, Take 325 mg by mouth See admin instructions. 325 mg every Monday, Wednesday, Friday at bedtime., Disp: , Rfl:    Finerenone (KERENDIA) 10 MG TABS, Take 10 mg by mouth in the morning., Disp: , Rfl:    hydrALAZINE (APRESOLINE) 100 MG tablet, Take 1 tablet (100 mg total) by mouth 3 (three) times daily., Disp: 270 tablet, Rfl: 3   insulin aspart (NOVOLOG) 100 UNIT/ML FlexPen, Inject 1-4 Units into the skin 3 (three) times daily with meals. Sliding scale < 151 : 0 units 151-200 : 1 units 201-250 : 2 units 251-300 : 3 units 301-400=4 units, Disp: , Rfl:    insulin degludec (TRESIBA FLEXTOUCH) 100 UNIT/ML FlexTouch Pen, Inject 10 Units into the skin daily., Disp: 3 mL, Rfl: 2   isosorbide dinitrate (ISORDIL) 30 MG tablet, Take 2 tablets (60 mg total) by mouth three times daily., Disp: 180 tablet, Rfl: 3   labetalol (NORMODYNE) 100 MG tablet, Take 100 mg by mouth 2 (two) times daily., Disp: , Rfl:    methimazole (TAPAZOLE) 5 MG tablet, Take 5 mg by mouth See admin instructions. 5 mg in the morning Monday, Tuesday,  Wednesday, Thursday, Disp: , Rfl:    torsemide (DEMADEX) 20 MG tablet, Take 1 tablet (20 mg total) by mouth daily., Disp: 30 tablet, Rfl: 0   zinc sulfate 220 (50 Zn) MG capsule, Take 1 capsule (220 mg total) by mouth daily., Disp: 30 capsule, Rfl: 0   ASCORBIC ACID PO, Take 1 tablet by mouth See admin instructions. Vitamin C, unknown strength. 1 tablet every Monday, Wednesday, Friday at bedtime., Disp: , Rfl:    ondansetron (ZOFRAN-ODT) 8 MG disintegrating tablet, Take 1 tablet (8 mg total) by mouth every 8 (eight) hours as needed for nausea or vomiting. (Patient not taking: Reported on 09/06/2022), Disp: 20 tablet, Rfl: 0    Cardiovascular & other pertient studies:  EKG 10/04/2022: Probable sinus rhythm 56 bpm with first degree AV block RBBB Low voltage  Echocardiogram 08/17/2021: Left ventricle cavity is normal in size. Mild concentric hypertrophy of the left ventricle. Normal global wall motion. Normal LV systolic function with EF 61%. Doppler evidence of grade I (impaired) diastolic dysfunction, normal LAP.  Left atrial cavity is severely dilated. Structurally normal trileaflet aortic valve.  Mild (Grade I) aortic regurgitation. Structurally normal mitral valve.  Mild to moderate mitral regurgitation. Structurally normal tricuspid valve.  Mild tricuspid regurgitation. Estimated pulmonary artery systolic pressure 37 mmHg. Previous study on 08/04/2020 noted grade II diastolic dysfunction, mild RA enlargement, estimated PASP 44 mmHg.  CT chest 08/05/2019: 1. Lung-RADS 2S, benign appearance or behavior. Continue annual screening with low-dose chest CT without contrast in 12 months. 2. The "S" modifier above refers to potentially clinically significant non lung cancer related findings. Specifically, there is aortic atherosclerosis, in addition to left anterior descending coronary artery disease. Please note that although the presence of coronary artery calcium documents the presence of  coronary artery  disease, the severity of this disease and any potential stenosis cannot be assessed on this non-gated CT examination. Assessment for potential risk factor modification, dietary therapy or pharmacologic therapy may be warranted, if clinically indicated. 3. Mild diffuse bronchial wall thickening with mild centrilobular and paraseptal emphysema; imaging findings suggestive of underlying COPD.   Aortic Atherosclerosis (ICD10-I70.0) and Emphysema (ICD10-J43.9).  US thyroid 05/2020: 1. Thyromegaly with bilateral nodules. Recommend FNA biopsy of moderately suspicious 1.9 cm inferior left nodule. 2. Recommend annual/biennial ultrasound follow-up of additional nodules as above, until stability x5 years confirmed.  Recent labs: 09/26/2022: Glucose 76, BUN/Cr 55/2.61. EGFR 19. Na/K 135/3.6. Albumin 2.6. Protein 4.8. AlKP 31. Rest of the CMP normal   03/01/2022: Glucose 238, BUN/Cr 37/2.8. EGFR 20.  HbA1C 7.3% Chol 213, TG 167, HDL 104, LDL 71 TSH 3.7 normal  02/05/2022: Glucose 90, BUN/Cr 26/2.15. EGFR 24. Na/K 137/3.8.  HbA1C 8.1% Tsat 10% low  Review of Systems  Unable to perform ROS: Dementia         Vitals:   10/04/22 1416  BP: (!) 169/82  Pulse: 63  Resp: 16  SpO2: 97%     Body mass index is 26.79 kg/m. Filed Weights   10/04/22 1416  Weight: 166 lb (75.3 kg)     Objective:   Physical Exam Vitals and nursing note reviewed.  Constitutional:      General: She is not in acute distress. Neck:     Vascular: No JVD.  Cardiovascular:     Rate and Rhythm: Normal rate and regular rhythm.     Pulses: Intact distal pulses.     Heart sounds: Murmur heard.     High-pitched blowing holosystolic murmur is present with a grade of 3/6 at the apex.  Pulmonary:     Effort: Pulmonary effort is normal.     Breath sounds: Normal breath sounds. No wheezing or rales.  Musculoskeletal:     Right lower leg: Edema (1+) present.     Left lower leg: Edema (1+)  present.  Neurological:     Comments: Does not know month, year. 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 I am very concerned given rapid deterioration of her memory, presumably Alzhiemers. Recommend follow up with Neurology. She may be owing to [place] with her sister.   CKD: Cr relatively stable around 2.6. Has f/u w/Dr. Moshe Cipro. Given mild leg swelling, residual from recent hospitalization with presumed HFpEF decompensation, I have asked her to increase torsemide to 20 mg bid for next three days.   Coronary atherosclerosis: LAD calcification seen on lung cancer CT chest. No angina symptoms. Not on aspirin due to ongoing use of eliquis. Continue metoprolol. Lipids well controlled. Strongly encourage smoking cessation.  Paroxysmal atrial fibrillation: In sinus rhythm today. Continue anticoagulation with Eliquis 5 mg twice daily for CHADSVASc Score of 5.    Hypertension: Recent increase in blood pressure could be realted to solumedrol. No change made today.  Hyperthyroidism: Continue f/u w/Endorcinology.    MR, TR, pulmonary hypertension: Likely driven by hypertnesion, as well as paroxysmal Afib. 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 Clinically stable.  [place] [facility]. I will be happy to transfer records over there.   F/u as neede  Nigel Mormon, MD [facility] [place] Pager:  [phone] Office: [phone]

## 2022-10-04 NOTE — Telephone Encounter (Signed)
Patient's home blood pressure has been slightly elevated in the past 3 weeks compared to the month of December.  December 6725 Average Systolic BP Level 500.16 mmHg Lowest Systolic BP Level 429 mmHg Highest Systolic BP Level 037 mmHg  January 1 - present Average Systolic BP Level 955.8 mmHg Lowest Systolic BP Level 316 mmHg Highest Systolic BP Level 742 mmHg  10/03/2022 Wednesday at 07:57 PM 167 / 90      10/03/2022 Wednesday at 09:43 AM 145 / 77      10/02/2022 Tuesday at 07:04 PM 168 / 94      10/02/2022 Tuesday at 09:54 AM 158 / 80      10/01/2022 Monday at 06:54 PM 152 / 78      10/01/2022 Monday at 10:08 AM 145 / 71      09/30/2022 Sunday at 07:29 PM 163 / 96      09/30/2022 Sunday at 02:09 PM 153 / 83      09/29/2022 Saturday at 07:26 PM 160 / 80      09/29/2022 Saturday at 09:54 AM 146 / 82      01 /08/2023 Friday at 08:12 PM 172 / 88

## 2022-10-05 ENCOUNTER — Ambulatory Visit: Payer: Medicare Other

## 2022-10-05 ENCOUNTER — Other Ambulatory Visit (HOSPITAL_COMMUNITY): Payer: Self-pay | Admitting: *Deleted

## 2022-10-05 DIAGNOSIS — E1122 Type 2 diabetes mellitus with diabetic chronic kidney disease: Secondary | ICD-10-CM | POA: Diagnosis not present

## 2022-10-05 DIAGNOSIS — N184 Chronic kidney disease, stage 4 (severe): Secondary | ICD-10-CM | POA: Diagnosis not present

## 2022-10-05 DIAGNOSIS — I5033 Acute on chronic diastolic (congestive) heart failure: Secondary | ICD-10-CM | POA: Diagnosis not present

## 2022-10-05 DIAGNOSIS — J432 Centrilobular emphysema: Secondary | ICD-10-CM | POA: Diagnosis not present

## 2022-10-05 DIAGNOSIS — U071 COVID-19: Secondary | ICD-10-CM | POA: Diagnosis not present

## 2022-10-05 DIAGNOSIS — I130 Hypertensive heart and chronic kidney disease with heart failure and stage 1 through stage 4 chronic kidney disease, or unspecified chronic kidney disease: Secondary | ICD-10-CM | POA: Diagnosis not present

## 2022-10-08 ENCOUNTER — Encounter (HOSPITAL_COMMUNITY)
Admission: RE | Admit: 2022-10-08 | Discharge: 2022-10-08 | Disposition: A | Discharge status: Home / Self Care | Payer: Medicare Other | Source: Ambulatory Visit | Attending: Nephrology | Admitting: Nephrology

## 2022-10-08 DIAGNOSIS — I129 Hypertensive chronic kidney disease with stage 1 through stage 4 chronic kidney disease, or unspecified chronic kidney disease: Secondary | ICD-10-CM | POA: Diagnosis not present

## 2022-10-08 DIAGNOSIS — U071 COVID-19: Secondary | ICD-10-CM | POA: Diagnosis not present

## 2022-10-08 DIAGNOSIS — N189 Chronic kidney disease, unspecified: Secondary | ICD-10-CM | POA: Diagnosis present

## 2022-10-08 DIAGNOSIS — D631 Anemia in chronic kidney disease: Secondary | ICD-10-CM | POA: Insufficient documentation

## 2022-10-08 DIAGNOSIS — I130 Hypertensive heart and chronic kidney disease with heart failure and stage 1 through stage 4 chronic kidney disease, or unspecified chronic kidney disease: Secondary | ICD-10-CM | POA: Diagnosis not present

## 2022-10-08 DIAGNOSIS — N184 Chronic kidney disease, stage 4 (severe): Secondary | ICD-10-CM | POA: Insufficient documentation

## 2022-10-08 DIAGNOSIS — I272 Pulmonary hypertension, unspecified: Secondary | ICD-10-CM | POA: Diagnosis not present

## 2022-10-08 DIAGNOSIS — I5033 Acute on chronic diastolic (congestive) heart failure: Secondary | ICD-10-CM | POA: Diagnosis not present

## 2022-10-08 DIAGNOSIS — J432 Centrilobular emphysema: Secondary | ICD-10-CM | POA: Diagnosis not present

## 2022-10-08 DIAGNOSIS — Z720 Tobacco use: Secondary | ICD-10-CM | POA: Diagnosis not present

## 2022-10-08 DIAGNOSIS — E1122 Type 2 diabetes mellitus with diabetic chronic kidney disease: Secondary | ICD-10-CM | POA: Diagnosis not present

## 2022-10-08 MED ORDER — SODIUM CHLORIDE 0.9 % IV SOLN
510.0000 mg | INTRAVENOUS | Status: DC
  Administered 2022-10-08: 510 mg via INTRAVENOUS
  Filled 2022-10-08: qty 510

## 2022-10-09 DIAGNOSIS — N184 Chronic kidney disease, stage 4 (severe): Secondary | ICD-10-CM | POA: Diagnosis not present

## 2022-10-09 DIAGNOSIS — E1122 Type 2 diabetes mellitus with diabetic chronic kidney disease: Secondary | ICD-10-CM | POA: Diagnosis not present

## 2022-10-09 DIAGNOSIS — I5033 Acute on chronic diastolic (congestive) heart failure: Secondary | ICD-10-CM | POA: Diagnosis not present

## 2022-10-09 DIAGNOSIS — I130 Hypertensive heart and chronic kidney disease with heart failure and stage 1 through stage 4 chronic kidney disease, or unspecified chronic kidney disease: Secondary | ICD-10-CM | POA: Diagnosis not present

## 2022-10-09 DIAGNOSIS — J432 Centrilobular emphysema: Secondary | ICD-10-CM | POA: Diagnosis not present

## 2022-10-09 DIAGNOSIS — U071 COVID-19: Secondary | ICD-10-CM | POA: Diagnosis not present

## 2022-10-10 DIAGNOSIS — I10 Essential (primary) hypertension: Secondary | ICD-10-CM | POA: Diagnosis not present

## 2022-10-10 DIAGNOSIS — Z022 Encounter for examination for admission to residential institution: Secondary | ICD-10-CM | POA: Diagnosis not present

## 2022-10-10 DIAGNOSIS — Z66 Do not resuscitate: Secondary | ICD-10-CM | POA: Diagnosis not present

## 2022-10-10 DIAGNOSIS — Z7189 Other specified counseling: Secondary | ICD-10-CM | POA: Diagnosis not present

## 2022-10-11 DIAGNOSIS — N184 Chronic kidney disease, stage 4 (severe): Secondary | ICD-10-CM | POA: Diagnosis not present

## 2022-10-11 DIAGNOSIS — I130 Hypertensive heart and chronic kidney disease with heart failure and stage 1 through stage 4 chronic kidney disease, or unspecified chronic kidney disease: Secondary | ICD-10-CM | POA: Diagnosis not present

## 2022-10-11 DIAGNOSIS — E1122 Type 2 diabetes mellitus with diabetic chronic kidney disease: Secondary | ICD-10-CM | POA: Diagnosis not present

## 2022-10-11 DIAGNOSIS — U071 COVID-19: Secondary | ICD-10-CM | POA: Diagnosis not present

## 2022-10-11 DIAGNOSIS — I5033 Acute on chronic diastolic (congestive) heart failure: Secondary | ICD-10-CM | POA: Diagnosis not present

## 2022-10-11 DIAGNOSIS — J432 Centrilobular emphysema: Secondary | ICD-10-CM | POA: Diagnosis not present

## 2022-10-13 DIAGNOSIS — I5033 Acute on chronic diastolic (congestive) heart failure: Secondary | ICD-10-CM | POA: Diagnosis not present

## 2022-10-13 DIAGNOSIS — E1122 Type 2 diabetes mellitus with diabetic chronic kidney disease: Secondary | ICD-10-CM | POA: Diagnosis not present

## 2022-10-13 DIAGNOSIS — J432 Centrilobular emphysema: Secondary | ICD-10-CM | POA: Diagnosis not present

## 2022-10-13 DIAGNOSIS — I130 Hypertensive heart and chronic kidney disease with heart failure and stage 1 through stage 4 chronic kidney disease, or unspecified chronic kidney disease: Secondary | ICD-10-CM | POA: Diagnosis not present

## 2022-10-13 DIAGNOSIS — U071 COVID-19: Secondary | ICD-10-CM | POA: Diagnosis not present

## 2022-10-13 DIAGNOSIS — N184 Chronic kidney disease, stage 4 (severe): Secondary | ICD-10-CM | POA: Diagnosis not present

## 2022-10-15 ENCOUNTER — Encounter (HOSPITAL_COMMUNITY)
Admission: RE | Admit: 2022-10-15 | Discharge: 2022-10-15 | Disposition: A | Discharge status: Home / Self Care | Payer: Medicare Other | Source: Ambulatory Visit | Attending: Nephrology | Admitting: Nephrology

## 2022-10-15 DIAGNOSIS — N184 Chronic kidney disease, stage 4 (severe): Secondary | ICD-10-CM | POA: Diagnosis not present

## 2022-10-15 DIAGNOSIS — D631 Anemia in chronic kidney disease: Secondary | ICD-10-CM | POA: Diagnosis not present

## 2022-10-15 MED ORDER — SODIUM CHLORIDE 0.9 % IV SOLN
510.0000 mg | INTRAVENOUS | Status: DC
  Administered 2022-10-15: 510 mg via INTRAVENOUS
  Filled 2022-10-15: qty 17

## 2022-10-17 DIAGNOSIS — I130 Hypertensive heart and chronic kidney disease with heart failure and stage 1 through stage 4 chronic kidney disease, or unspecified chronic kidney disease: Secondary | ICD-10-CM | POA: Diagnosis not present

## 2022-10-17 DIAGNOSIS — J432 Centrilobular emphysema: Secondary | ICD-10-CM | POA: Diagnosis not present

## 2022-10-17 DIAGNOSIS — N184 Chronic kidney disease, stage 4 (severe): Secondary | ICD-10-CM | POA: Diagnosis not present

## 2022-10-17 DIAGNOSIS — I5033 Acute on chronic diastolic (congestive) heart failure: Secondary | ICD-10-CM | POA: Diagnosis not present

## 2022-10-17 DIAGNOSIS — F0390 Unspecified dementia without behavioral disturbance: Secondary | ICD-10-CM | POA: Diagnosis not present

## 2022-10-17 DIAGNOSIS — U071 COVID-19: Secondary | ICD-10-CM | POA: Diagnosis not present

## 2022-10-17 DIAGNOSIS — E1122 Type 2 diabetes mellitus with diabetic chronic kidney disease: Secondary | ICD-10-CM | POA: Diagnosis not present

## 2022-10-20 DIAGNOSIS — U071 COVID-19: Secondary | ICD-10-CM | POA: Diagnosis not present

## 2022-10-20 DIAGNOSIS — I5033 Acute on chronic diastolic (congestive) heart failure: Secondary | ICD-10-CM | POA: Diagnosis not present

## 2022-10-20 DIAGNOSIS — E1122 Type 2 diabetes mellitus with diabetic chronic kidney disease: Secondary | ICD-10-CM | POA: Diagnosis not present

## 2022-10-20 DIAGNOSIS — N184 Chronic kidney disease, stage 4 (severe): Secondary | ICD-10-CM | POA: Diagnosis not present

## 2022-10-20 DIAGNOSIS — I130 Hypertensive heart and chronic kidney disease with heart failure and stage 1 through stage 4 chronic kidney disease, or unspecified chronic kidney disease: Secondary | ICD-10-CM | POA: Diagnosis not present

## 2022-10-20 DIAGNOSIS — J432 Centrilobular emphysema: Secondary | ICD-10-CM | POA: Diagnosis not present

## 2022-10-23 DIAGNOSIS — N184 Chronic kidney disease, stage 4 (severe): Secondary | ICD-10-CM | POA: Diagnosis not present

## 2022-10-23 DIAGNOSIS — I130 Hypertensive heart and chronic kidney disease with heart failure and stage 1 through stage 4 chronic kidney disease, or unspecified chronic kidney disease: Secondary | ICD-10-CM | POA: Diagnosis not present

## 2022-10-23 DIAGNOSIS — J432 Centrilobular emphysema: Secondary | ICD-10-CM | POA: Diagnosis not present

## 2022-10-23 DIAGNOSIS — I10 Essential (primary) hypertension: Secondary | ICD-10-CM | POA: Diagnosis not present

## 2022-10-23 DIAGNOSIS — U071 COVID-19: Secondary | ICD-10-CM | POA: Diagnosis not present

## 2022-10-23 DIAGNOSIS — I5033 Acute on chronic diastolic (congestive) heart failure: Secondary | ICD-10-CM | POA: Diagnosis not present

## 2022-10-23 DIAGNOSIS — E1122 Type 2 diabetes mellitus with diabetic chronic kidney disease: Secondary | ICD-10-CM | POA: Diagnosis not present

## 2022-10-26 ENCOUNTER — Telehealth: Payer: Self-pay

## 2022-10-26 ENCOUNTER — Ambulatory Visit: Payer: 59 | Admitting: Cardiology

## 2022-10-26 DIAGNOSIS — E1122 Type 2 diabetes mellitus with diabetic chronic kidney disease: Secondary | ICD-10-CM | POA: Diagnosis not present

## 2022-10-26 DIAGNOSIS — J432 Centrilobular emphysema: Secondary | ICD-10-CM | POA: Diagnosis not present

## 2022-10-26 DIAGNOSIS — U071 COVID-19: Secondary | ICD-10-CM | POA: Diagnosis not present

## 2022-10-26 DIAGNOSIS — I5033 Acute on chronic diastolic (congestive) heart failure: Secondary | ICD-10-CM | POA: Diagnosis not present

## 2022-10-26 DIAGNOSIS — N184 Chronic kidney disease, stage 4 (severe): Secondary | ICD-10-CM | POA: Diagnosis not present

## 2022-10-26 DIAGNOSIS — I130 Hypertensive heart and chronic kidney disease with heart failure and stage 1 through stage 4 chronic kidney disease, or unspecified chronic kidney disease: Secondary | ICD-10-CM | POA: Diagnosis not present

## 2022-10-26 NOTE — Progress Notes (Deleted)
Follow up visit  Subjective:   Carol Valenzuela, female    DOB: 05/30/1951, 72 y.o.   MRN: [id number]  HPI   No chief complaint on file.   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   Systolic Blood Pressure          22.6 (A999333 - 99991111) Diastolic Blood Pressure        62.4 (45.0 - 81.0) Heart Rate       55.9 (50.0 - 63.0)   10/25/22 10:07 AM                     117      /           61        mmHg  54        bpm      10/24/22 9:48 AM                       124      /           59        mmHg  54        bpm      10/24/22 9:47 AM                       111      /           56        mmHg  56        bpm      10/23/22 12:41 PM                     160      /           81        mmHg  56        bpm      10/22/22 10:07 AM                     115      /           58        mmHg  56        bpm      10/21/22 10:04 AM                     125      /           60        mmHg  57        bpm      10/20/22 9:46 AM                       106      /           53        mmHg  55        bpm      10/19/22 10:07 AM                     111      /  59        mmHg  55        bpm      10/17/22 10:32 AM                   140      /           64        mmHg  56        bpm      10/16/22 10:08 AM                   115      /           59        mmHg  58        bpm          Current Outpatient Medications:    apixaban (ELIQUIS) 5 MG TABS tablet, Take 5 mg by mouth 2 (two) times daily., Disp: , Rfl:    ascorbic acid (VITAMIN C) 500 MG tablet, Take 1 tablet (500 mg total) by mouth daily., Disp: 30 tablet, Rfl: 0   ASCORBIC ACID PO, Take 1 tablet by mouth See admin instructions. Vitamin C, unknown strength. 1 tablet every Monday, Wednesday, Friday at bedtime., Disp: , Rfl:    atorvastatin (LIPITOR) 40 MG tablet, TAKE 1 TABLET EVERY DAY  (Patient taking differently: Take 40 mg by mouth daily with supper.), Disp: 90 tablet, Rfl: 2   buPROPion (WELLBUTRIN XL) 150 MG 24 hr tablet, Take 150 mg by mouth in the morning., Disp: , Rfl:    Calcium Carb-Cholecalciferol (CALCIUM + VITAMIN D3 PO), Take 1 tablet by mouth daily with lunch., Disp: , Rfl:    donepezil (ARICEPT) 10 MG tablet, Take 10 mg by mouth at bedtime., Disp: , Rfl:    ferrous sulfate (FERROUSUL) 325 (65 FE) MG tablet, Take 325 mg by mouth See admin instructions. 325 mg every Monday, Wednesday, Friday at bedtime., Disp: , Rfl:    Finerenone (KERENDIA) 10 MG TABS, Take 10 mg by mouth in the morning., Disp: , Rfl:    hydrALAZINE (APRESOLINE) 100 MG tablet, Take 1 tablet (100 mg total) by mouth 3 (three) times daily., Disp: 270 tablet, Rfl: 3   insulin aspart (NOVOLOG) 100 UNIT/ML FlexPen, Inject 1-4 Units into the skin 3 (three) times daily with meals. Sliding scale < 151 : 0 units 151-200 : 1 units 201-250 : 2 units 251-300 : 3 units 301-400=4 units, Disp: , Rfl:    insulin degludec (TRESIBA FLEXTOUCH) 100 UNIT/ML FlexTouch Pen, Inject 10 Units into the skin daily., Disp: 3 mL, Rfl: 2   isosorbide dinitrate (ISORDIL) 30 MG tablet, Take 2 tablets (60 mg total) by mouth three times daily., Disp: 180 tablet, Rfl: 3   labetalol (NORMODYNE) 100 MG tablet, Take 100 mg by mouth 2 (two) times daily., Disp: , Rfl:    methimazole (TAPAZOLE) 5 MG tablet, Take 5 mg by mouth See admin instructions. 5 mg in the morning Monday, Tuesday, Wednesday, Thursday, Disp: , Rfl:    ondansetron (ZOFRAN-ODT) 8 MG disintegrating tablet, Take 1 tablet (8 mg total) by mouth every 8 (eight) hours as needed for nausea or vomiting. (Patient not taking: Reported on 09/06/2022), Disp: 20 tablet, Rfl: 0   torsemide (DEMADEX) 20 MG tablet, Take 1 tablet (20 mg total) by mouth daily., Disp: 30 tablet, Rfl: 0   zinc sulfate 220 (50 Zn) MG capsule, Take 1  capsule (220 mg total) by mouth daily., Disp: 30 capsule, Rfl:  0    Cardiovascular & other pertient studies:  EKG 10/04/2022: Probable sinus rhythm 56 bpm with first degree AV block RBBB Low voltage  Echocardiogram 08/17/2021: Left ventricle cavity is normal in size. Mild concentric hypertrophy of the left ventricle. Normal global wall motion. Normal LV systolic function with EF 61%. Doppler evidence of grade I (impaired) diastolic dysfunction, normal LAP.  Left atrial cavity is severely dilated. Structurally normal trileaflet aortic valve.  Mild (Grade I) aortic regurgitation. Structurally normal mitral valve.  Mild to moderate mitral regurgitation. Structurally normal tricuspid valve.  Mild tricuspid regurgitation. Estimated pulmonary artery systolic pressure 37 mmHg. Previous study on 08/04/2020 noted grade II diastolic dysfunction, mild RA enlargement, estimated PASP 44 mmHg.  CT chest 08/05/2019: 1. Lung-RADS 2S, benign appearance or behavior. Continue annual screening with low-dose chest CT without contrast in 12 months. 2. The "S" modifier above refers to potentially clinically significant non lung cancer related findings. Specifically, there is aortic atherosclerosis, in addition to left anterior descending coronary artery disease. Please note that although the presence of coronary artery calcium documents the presence of coronary artery disease, the severity of this disease and any potential stenosis cannot be assessed on this non-gated CT examination. Assessment for potential risk factor modification, dietary therapy or pharmacologic therapy may be warranted, if clinically indicated. 3. Mild diffuse bronchial wall thickening with mild centrilobular and paraseptal emphysema; imaging findings suggestive of underlying COPD.   Aortic Atherosclerosis (ICD10-I70.0) and Emphysema (ICD10-J43.9).  US thyroid 05/2020: 1. Thyromegaly with bilateral nodules. Recommend FNA biopsy of moderately suspicious 1.9 cm inferior left nodule. 2.  Recommend annual/biennial ultrasound follow-up of additional nodules as above, until stability x5 years confirmed.  Recent labs: 09/26/2022: Glucose 76, BUN/Cr 55/2.61. EGFR 19. Na/K 135/3.6. Albumin 2.6. Protein 4.8. AlKP 31. Rest of the CMP normal   03/01/2022: Glucose 238, BUN/Cr 37/2.8. EGFR 20.  HbA1C 7.3% Chol 213, TG 167, HDL 104, LDL 71 TSH 3.7 normal  02/05/2022: Glucose 90, BUN/Cr 26/2.15. EGFR 24. Na/K 137/3.8.  HbA1C 8.1% Tsat 10% low  Review of Systems  Unable to perform ROS: Dementia         There were no vitals filed for this visit.    There is no height or weight on file to calculate BMI. There were no vitals filed for this visit.    Objective:   Physical Exam Vitals and nursing note reviewed.  Constitutional:      General: She is not in acute distress. Neck:     Vascular: No JVD.  Cardiovascular:     Rate and Rhythm: Normal rate and regular rhythm.     Pulses: Intact distal pulses.     Heart sounds: Murmur heard.     High-pitched blowing holosystolic murmur is present with a grade of 3/6 at the apex.  Pulmonary:     Effort: Pulmonary effort is normal.     Breath sounds: Normal breath sounds. No wheezing or rales.  Musculoskeletal:     Right lower leg: Edema (1+) present.     Left lower leg: Edema (1+) present.  Neurological:     Comments: Does not know month, year. 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 I am very concerned given rapid deterioration of  her memory, presumably Alzhiemers. Recommend follow up with Neurology. She may be owing to [place] with her sister.   CKD: Cr relatively stable around 2.6. Has f/u w/Dr. Moshe Cipro. Given mild leg swelling, residual from recent hospitalization with presumed HFpEF  decompensation, I have asked her to increase torsemide to 20 mg bid for next three days.   Coronary atherosclerosis: LAD calcification seen on lung cancer CT chest. No angina symptoms. Not on aspirin due to ongoing use of eliquis. Continue metoprolol. Lipids well controlled. Strongly encourage smoking cessation.  Paroxysmal atrial fibrillation: In sinus rhythm today. Continue anticoagulation with Eliquis 5 mg twice daily for CHADSVASc Score of 5.    Hypertension: Recent increase in blood pressure could be realted to solumedrol. No change made today.  Hyperthyroidism: Continue f/u w/Endorcinology.    MR, TR, pulmonary hypertension: Likely driven by hypertnesion, as well as paroxysmal Afib. 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 Clinically stable.  [place] [facility] I will be happy to transfer records over there.   F/u as neede  Nigel Mormon, MD [facility] [place] Pager: [phone] Office: [phone]

## 2022-10-26 NOTE — Telephone Encounter (Signed)
Patient home blood pressure is controlled on current antihypertensive regimen.  Systolic Blood Pressure [id number] (A999333 - 99991111) Diastolic Blood Pressure [id number] (45.0 - 81.0) Heart Rate 55.9 (50.0 - 63.0)  10/25/22 10:07 AM  117 / 61 mmHg 54 bpm  10/24/22 9:48 AM  124 / 59 mmHg 54 bpm  10/24/22 9:47 AM  111 / 56 mmHg 56 bpm  10/23/22 12:41 PM  160 / 81 mmHg 56 bpm  10/22/22 10:07 AM  115 / 58 mmHg 56 bpm  10/21/22 10:04 AM  125 / 60 mmHg 57 bpm  10/20/22 9:46 AM  106 / 53 mmHg 55 bpm  10/19/22 10:07 AM  111 / 59 mmHg 55 bpm  10/17/22 10:32 AM  140 / 64 mmHg 56 bpm  10/16/22 10:08 AM  115 / 59 mmHg 58 bpm

## 2022-10-31 DIAGNOSIS — N184 Chronic kidney disease, stage 4 (severe): Secondary | ICD-10-CM | POA: Diagnosis not present

## 2022-10-31 DIAGNOSIS — I5032 Chronic diastolic (congestive) heart failure: Secondary | ICD-10-CM | POA: Diagnosis not present

## 2022-10-31 DIAGNOSIS — Z758 Other problems related to medical facilities and other health care: Secondary | ICD-10-CM | POA: Diagnosis not present

## 2022-10-31 DIAGNOSIS — F0390 Unspecified dementia without behavioral disturbance: Secondary | ICD-10-CM | POA: Diagnosis not present

## 2022-11-05 DIAGNOSIS — I10 Essential (primary) hypertension: Secondary | ICD-10-CM | POA: Diagnosis not present

## 2022-11-05 DIAGNOSIS — E119 Type 2 diabetes mellitus without complications: Secondary | ICD-10-CM | POA: Diagnosis not present

## 2022-11-05 DIAGNOSIS — I482 Chronic atrial fibrillation, unspecified: Secondary | ICD-10-CM | POA: Diagnosis not present

## 2022-11-05 DIAGNOSIS — I509 Heart failure, unspecified: Secondary | ICD-10-CM | POA: Diagnosis not present

## 2022-11-05 DIAGNOSIS — E785 Hyperlipidemia, unspecified: Secondary | ICD-10-CM | POA: Diagnosis not present

## 2022-11-06 DIAGNOSIS — B351 Tinea unguium: Secondary | ICD-10-CM | POA: Diagnosis not present

## 2022-11-06 DIAGNOSIS — L603 Nail dystrophy: Secondary | ICD-10-CM | POA: Diagnosis not present

## 2022-11-06 DIAGNOSIS — M7741 Metatarsalgia, right foot: Secondary | ICD-10-CM | POA: Diagnosis not present

## 2022-11-06 DIAGNOSIS — M79671 Pain in right foot: Secondary | ICD-10-CM | POA: Diagnosis not present

## 2022-11-06 DIAGNOSIS — I87303 Chronic venous hypertension (idiopathic) without complications of bilateral lower extremity: Secondary | ICD-10-CM | POA: Diagnosis not present

## 2022-11-06 DIAGNOSIS — E1142 Type 2 diabetes mellitus with diabetic polyneuropathy: Secondary | ICD-10-CM | POA: Diagnosis not present

## 2022-11-06 DIAGNOSIS — M79672 Pain in left foot: Secondary | ICD-10-CM | POA: Diagnosis not present

## 2022-11-09 ENCOUNTER — Ambulatory Visit: Payer: Medicare Other

## 2022-11-14 DIAGNOSIS — F0390 Unspecified dementia without behavioral disturbance: Secondary | ICD-10-CM | POA: Diagnosis not present

## 2022-11-14 DIAGNOSIS — E782 Mixed hyperlipidemia: Secondary | ICD-10-CM | POA: Diagnosis not present

## 2022-11-14 DIAGNOSIS — I10 Essential (primary) hypertension: Secondary | ICD-10-CM | POA: Diagnosis not present

## 2022-11-14 DIAGNOSIS — E119 Type 2 diabetes mellitus without complications: Secondary | ICD-10-CM | POA: Diagnosis not present

## 2022-11-14 DIAGNOSIS — Z7901 Long term (current) use of anticoagulants: Secondary | ICD-10-CM | POA: Diagnosis not present

## 2022-11-14 DIAGNOSIS — N289 Disorder of kidney and ureter, unspecified: Secondary | ICD-10-CM | POA: Diagnosis not present

## 2022-11-14 DIAGNOSIS — Z6826 Body mass index (BMI) 26.0-26.9, adult: Secondary | ICD-10-CM | POA: Diagnosis not present

## 2022-11-14 DIAGNOSIS — I509 Heart failure, unspecified: Secondary | ICD-10-CM | POA: Diagnosis not present

## 2022-11-14 DIAGNOSIS — I4891 Unspecified atrial fibrillation: Secondary | ICD-10-CM | POA: Diagnosis not present

## 2022-11-14 DIAGNOSIS — F17200 Nicotine dependence, unspecified, uncomplicated: Secondary | ICD-10-CM | POA: Diagnosis not present

## 2022-11-15 DIAGNOSIS — F330 Major depressive disorder, recurrent, mild: Secondary | ICD-10-CM | POA: Diagnosis not present

## 2022-11-15 DIAGNOSIS — F4325 Adjustment disorder with mixed disturbance of emotions and conduct: Secondary | ICD-10-CM | POA: Diagnosis not present

## 2022-11-15 DIAGNOSIS — F5101 Primary insomnia: Secondary | ICD-10-CM | POA: Diagnosis not present

## 2022-11-15 DIAGNOSIS — G301 Alzheimer's disease with late onset: Secondary | ICD-10-CM | POA: Diagnosis not present

## 2022-11-21 ENCOUNTER — Other Ambulatory Visit: Payer: Self-pay | Admitting: Cardiology

## 2022-11-21 DIAGNOSIS — I10 Essential (primary) hypertension: Secondary | ICD-10-CM

## 2022-11-21 DIAGNOSIS — K5900 Constipation, unspecified: Secondary | ICD-10-CM | POA: Diagnosis not present

## 2022-11-21 DIAGNOSIS — E109 Type 1 diabetes mellitus without complications: Secondary | ICD-10-CM | POA: Diagnosis not present

## 2022-11-21 DIAGNOSIS — N184 Chronic kidney disease, stage 4 (severe): Secondary | ICD-10-CM | POA: Diagnosis not present

## 2022-11-21 DIAGNOSIS — D649 Anemia, unspecified: Secondary | ICD-10-CM | POA: Diagnosis not present

## 2022-11-22 DIAGNOSIS — I10 Essential (primary) hypertension: Secondary | ICD-10-CM | POA: Diagnosis not present

## 2022-11-28 DIAGNOSIS — I482 Chronic atrial fibrillation, unspecified: Secondary | ICD-10-CM | POA: Diagnosis not present

## 2022-11-28 DIAGNOSIS — I509 Heart failure, unspecified: Secondary | ICD-10-CM | POA: Diagnosis not present

## 2022-11-28 DIAGNOSIS — E785 Hyperlipidemia, unspecified: Secondary | ICD-10-CM | POA: Diagnosis not present

## 2022-11-29 DIAGNOSIS — I130 Hypertensive heart and chronic kidney disease with heart failure and stage 1 through stage 4 chronic kidney disease, or unspecified chronic kidney disease: Secondary | ICD-10-CM | POA: Diagnosis not present

## 2022-11-29 DIAGNOSIS — N184 Chronic kidney disease, stage 4 (severe): Secondary | ICD-10-CM | POA: Diagnosis not present

## 2022-11-29 DIAGNOSIS — I5033 Acute on chronic diastolic (congestive) heart failure: Secondary | ICD-10-CM | POA: Diagnosis not present

## 2022-11-29 DIAGNOSIS — E1122 Type 2 diabetes mellitus with diabetic chronic kidney disease: Secondary | ICD-10-CM | POA: Diagnosis not present

## 2022-11-30 DIAGNOSIS — I5032 Chronic diastolic (congestive) heart failure: Secondary | ICD-10-CM | POA: Diagnosis not present

## 2022-11-30 DIAGNOSIS — F03911 Unspecified dementia, unspecified severity, with agitation: Secondary | ICD-10-CM | POA: Diagnosis not present

## 2022-11-30 DIAGNOSIS — N184 Chronic kidney disease, stage 4 (severe): Secondary | ICD-10-CM | POA: Diagnosis not present

## 2022-12-04 DIAGNOSIS — F5101 Primary insomnia: Secondary | ICD-10-CM | POA: Diagnosis not present

## 2022-12-04 DIAGNOSIS — G301 Alzheimer's disease with late onset: Secondary | ICD-10-CM | POA: Diagnosis not present

## 2022-12-04 DIAGNOSIS — F4325 Adjustment disorder with mixed disturbance of emotions and conduct: Secondary | ICD-10-CM | POA: Diagnosis not present

## 2022-12-04 DIAGNOSIS — F330 Major depressive disorder, recurrent, mild: Secondary | ICD-10-CM | POA: Diagnosis not present

## 2022-12-07 DIAGNOSIS — N185 Chronic kidney disease, stage 5: Secondary | ICD-10-CM | POA: Diagnosis not present

## 2022-12-07 DIAGNOSIS — E782 Mixed hyperlipidemia: Secondary | ICD-10-CM | POA: Diagnosis not present

## 2022-12-07 DIAGNOSIS — I5030 Unspecified diastolic (congestive) heart failure: Secondary | ICD-10-CM | POA: Diagnosis not present

## 2022-12-07 DIAGNOSIS — I10 Essential (primary) hypertension: Secondary | ICD-10-CM | POA: Diagnosis not present

## 2022-12-07 DIAGNOSIS — I509 Heart failure, unspecified: Secondary | ICD-10-CM | POA: Diagnosis not present

## 2022-12-07 DIAGNOSIS — E108 Type 1 diabetes mellitus with unspecified complications: Secondary | ICD-10-CM | POA: Diagnosis not present

## 2022-12-07 DIAGNOSIS — I4891 Unspecified atrial fibrillation: Secondary | ICD-10-CM | POA: Diagnosis not present

## 2022-12-11 DIAGNOSIS — I10 Essential (primary) hypertension: Secondary | ICD-10-CM | POA: Diagnosis not present

## 2022-12-11 DIAGNOSIS — I509 Heart failure, unspecified: Secondary | ICD-10-CM | POA: Diagnosis not present

## 2022-12-14 DIAGNOSIS — N184 Chronic kidney disease, stage 4 (severe): Secondary | ICD-10-CM | POA: Diagnosis not present

## 2022-12-14 DIAGNOSIS — E109 Type 1 diabetes mellitus without complications: Secondary | ICD-10-CM | POA: Diagnosis not present

## 2022-12-14 DIAGNOSIS — I10 Essential (primary) hypertension: Secondary | ICD-10-CM | POA: Diagnosis not present

## 2022-12-14 DIAGNOSIS — D638 Anemia in other chronic diseases classified elsewhere: Secondary | ICD-10-CM | POA: Diagnosis not present

## 2022-12-14 DIAGNOSIS — D631 Anemia in chronic kidney disease: Secondary | ICD-10-CM | POA: Diagnosis not present

## 2022-12-14 DIAGNOSIS — R809 Proteinuria, unspecified: Secondary | ICD-10-CM | POA: Diagnosis not present

## 2022-12-19 DIAGNOSIS — F4325 Adjustment disorder with mixed disturbance of emotions and conduct: Secondary | ICD-10-CM | POA: Diagnosis not present

## 2022-12-19 DIAGNOSIS — G301 Alzheimer's disease with late onset: Secondary | ICD-10-CM | POA: Diagnosis not present

## 2022-12-19 DIAGNOSIS — F330 Major depressive disorder, recurrent, mild: Secondary | ICD-10-CM | POA: Diagnosis not present

## 2022-12-19 DIAGNOSIS — F5101 Primary insomnia: Secondary | ICD-10-CM | POA: Diagnosis not present

## 2022-12-21 DIAGNOSIS — K5900 Constipation, unspecified: Secondary | ICD-10-CM | POA: Diagnosis not present

## 2022-12-26 DIAGNOSIS — G301 Alzheimer's disease with late onset: Secondary | ICD-10-CM | POA: Diagnosis not present

## 2022-12-26 DIAGNOSIS — F4325 Adjustment disorder with mixed disturbance of emotions and conduct: Secondary | ICD-10-CM | POA: Diagnosis not present

## 2022-12-26 DIAGNOSIS — F330 Major depressive disorder, recurrent, mild: Secondary | ICD-10-CM | POA: Diagnosis not present

## 2022-12-26 DIAGNOSIS — F5101 Primary insomnia: Secondary | ICD-10-CM | POA: Diagnosis not present

## 2022-12-31 DIAGNOSIS — I10 Essential (primary) hypertension: Secondary | ICD-10-CM | POA: Diagnosis not present

## 2022-12-31 DIAGNOSIS — Z6826 Body mass index (BMI) 26.0-26.9, adult: Secondary | ICD-10-CM | POA: Diagnosis not present

## 2022-12-31 DIAGNOSIS — Z7901 Long term (current) use of anticoagulants: Secondary | ICD-10-CM | POA: Diagnosis not present

## 2022-12-31 DIAGNOSIS — N184 Chronic kidney disease, stage 4 (severe): Secondary | ICD-10-CM | POA: Diagnosis not present

## 2022-12-31 DIAGNOSIS — E119 Type 2 diabetes mellitus without complications: Secondary | ICD-10-CM | POA: Diagnosis not present

## 2022-12-31 DIAGNOSIS — E782 Mixed hyperlipidemia: Secondary | ICD-10-CM | POA: Diagnosis not present

## 2022-12-31 DIAGNOSIS — F0390 Unspecified dementia without behavioral disturbance: Secondary | ICD-10-CM | POA: Diagnosis not present

## 2022-12-31 DIAGNOSIS — F17200 Nicotine dependence, unspecified, uncomplicated: Secondary | ICD-10-CM | POA: Diagnosis not present

## 2022-12-31 DIAGNOSIS — I509 Heart failure, unspecified: Secondary | ICD-10-CM | POA: Diagnosis not present

## 2022-12-31 DIAGNOSIS — I4891 Unspecified atrial fibrillation: Secondary | ICD-10-CM | POA: Diagnosis not present

## 2023-01-01 DIAGNOSIS — N184 Chronic kidney disease, stage 4 (severe): Secondary | ICD-10-CM | POA: Diagnosis not present

## 2023-01-01 DIAGNOSIS — I10 Essential (primary) hypertension: Secondary | ICD-10-CM | POA: Diagnosis not present

## 2023-01-01 DIAGNOSIS — E0590 Thyrotoxicosis, unspecified without thyrotoxic crisis or storm: Secondary | ICD-10-CM | POA: Diagnosis not present

## 2023-01-01 DIAGNOSIS — E119 Type 2 diabetes mellitus without complications: Secondary | ICD-10-CM | POA: Diagnosis not present

## 2023-01-02 DIAGNOSIS — E782 Mixed hyperlipidemia: Secondary | ICD-10-CM | POA: Diagnosis not present

## 2023-01-02 DIAGNOSIS — I10 Essential (primary) hypertension: Secondary | ICD-10-CM | POA: Diagnosis not present

## 2023-01-02 DIAGNOSIS — I4891 Unspecified atrial fibrillation: Secondary | ICD-10-CM | POA: Diagnosis not present

## 2023-01-02 DIAGNOSIS — I509 Heart failure, unspecified: Secondary | ICD-10-CM | POA: Diagnosis not present

## 2023-01-09 DIAGNOSIS — I509 Heart failure, unspecified: Secondary | ICD-10-CM | POA: Diagnosis not present

## 2023-01-09 DIAGNOSIS — N281 Cyst of kidney, acquired: Secondary | ICD-10-CM | POA: Diagnosis not present

## 2023-01-09 DIAGNOSIS — I10 Essential (primary) hypertension: Secondary | ICD-10-CM | POA: Diagnosis not present

## 2023-01-09 DIAGNOSIS — N184 Chronic kidney disease, stage 4 (severe): Secondary | ICD-10-CM | POA: Diagnosis not present

## 2023-01-18 DIAGNOSIS — N189 Chronic kidney disease, unspecified: Secondary | ICD-10-CM | POA: Diagnosis not present

## 2023-01-21 DIAGNOSIS — I10 Essential (primary) hypertension: Secondary | ICD-10-CM | POA: Diagnosis not present

## 2023-01-21 DIAGNOSIS — I509 Heart failure, unspecified: Secondary | ICD-10-CM | POA: Diagnosis not present

## 2023-01-22 DIAGNOSIS — F330 Major depressive disorder, recurrent, mild: Secondary | ICD-10-CM | POA: Diagnosis not present

## 2023-01-22 DIAGNOSIS — F4325 Adjustment disorder with mixed disturbance of emotions and conduct: Secondary | ICD-10-CM | POA: Diagnosis not present

## 2023-01-22 DIAGNOSIS — F5101 Primary insomnia: Secondary | ICD-10-CM | POA: Diagnosis not present

## 2023-01-22 DIAGNOSIS — G301 Alzheimer's disease with late onset: Secondary | ICD-10-CM | POA: Diagnosis not present

## 2023-01-23 DIAGNOSIS — E785 Hyperlipidemia, unspecified: Secondary | ICD-10-CM | POA: Diagnosis not present

## 2023-01-23 DIAGNOSIS — F32A Depression, unspecified: Secondary | ICD-10-CM | POA: Diagnosis not present

## 2023-01-23 DIAGNOSIS — N184 Chronic kidney disease, stage 4 (severe): Secondary | ICD-10-CM | POA: Diagnosis not present

## 2023-01-23 DIAGNOSIS — E119 Type 2 diabetes mellitus without complications: Secondary | ICD-10-CM | POA: Diagnosis not present

## 2023-01-23 DIAGNOSIS — I482 Chronic atrial fibrillation, unspecified: Secondary | ICD-10-CM | POA: Diagnosis not present

## 2023-01-23 DIAGNOSIS — F0390 Unspecified dementia without behavioral disturbance: Secondary | ICD-10-CM | POA: Diagnosis not present

## 2023-01-23 DIAGNOSIS — I509 Heart failure, unspecified: Secondary | ICD-10-CM | POA: Diagnosis not present

## 2023-01-23 DIAGNOSIS — I10 Essential (primary) hypertension: Secondary | ICD-10-CM | POA: Diagnosis not present

## 2023-01-25 DIAGNOSIS — N189 Chronic kidney disease, unspecified: Secondary | ICD-10-CM | POA: Diagnosis not present

## 2023-01-26 DIAGNOSIS — I509 Heart failure, unspecified: Secondary | ICD-10-CM | POA: Diagnosis not present

## 2023-01-26 DIAGNOSIS — G311 Senile degeneration of brain, not elsewhere classified: Secondary | ICD-10-CM | POA: Diagnosis not present

## 2023-01-26 DIAGNOSIS — J439 Emphysema, unspecified: Secondary | ICD-10-CM | POA: Diagnosis not present

## 2023-01-26 DIAGNOSIS — I482 Chronic atrial fibrillation, unspecified: Secondary | ICD-10-CM | POA: Diagnosis not present

## 2023-01-26 DIAGNOSIS — N185 Chronic kidney disease, stage 5: Secondary | ICD-10-CM | POA: Diagnosis not present

## 2023-01-26 DIAGNOSIS — F32A Depression, unspecified: Secondary | ICD-10-CM | POA: Diagnosis not present

## 2023-01-26 DIAGNOSIS — E785 Hyperlipidemia, unspecified: Secondary | ICD-10-CM | POA: Diagnosis not present

## 2023-01-26 DIAGNOSIS — E1122 Type 2 diabetes mellitus with diabetic chronic kidney disease: Secondary | ICD-10-CM | POA: Diagnosis not present

## 2023-01-26 DIAGNOSIS — I10 Essential (primary) hypertension: Secondary | ICD-10-CM | POA: Diagnosis not present

## 2023-01-27 DIAGNOSIS — I10 Essential (primary) hypertension: Secondary | ICD-10-CM | POA: Diagnosis not present

## 2023-01-27 DIAGNOSIS — N185 Chronic kidney disease, stage 5: Secondary | ICD-10-CM | POA: Diagnosis not present

## 2023-01-27 DIAGNOSIS — I482 Chronic atrial fibrillation, unspecified: Secondary | ICD-10-CM | POA: Diagnosis not present

## 2023-01-27 DIAGNOSIS — E1122 Type 2 diabetes mellitus with diabetic chronic kidney disease: Secondary | ICD-10-CM | POA: Diagnosis not present

## 2023-01-27 DIAGNOSIS — G311 Senile degeneration of brain, not elsewhere classified: Secondary | ICD-10-CM | POA: Diagnosis not present

## 2023-01-27 DIAGNOSIS — F32A Depression, unspecified: Secondary | ICD-10-CM | POA: Diagnosis not present

## 2023-01-28 DIAGNOSIS — G311 Senile degeneration of brain, not elsewhere classified: Secondary | ICD-10-CM | POA: Diagnosis not present

## 2023-01-28 DIAGNOSIS — F32A Depression, unspecified: Secondary | ICD-10-CM | POA: Diagnosis not present

## 2023-01-28 DIAGNOSIS — E1122 Type 2 diabetes mellitus with diabetic chronic kidney disease: Secondary | ICD-10-CM | POA: Diagnosis not present

## 2023-01-28 DIAGNOSIS — I10 Essential (primary) hypertension: Secondary | ICD-10-CM | POA: Diagnosis not present

## 2023-01-28 DIAGNOSIS — N185 Chronic kidney disease, stage 5: Secondary | ICD-10-CM | POA: Diagnosis not present

## 2023-01-28 DIAGNOSIS — I482 Chronic atrial fibrillation, unspecified: Secondary | ICD-10-CM | POA: Diagnosis not present

## 2023-01-29 DIAGNOSIS — I10 Essential (primary) hypertension: Secondary | ICD-10-CM | POA: Diagnosis not present

## 2023-01-29 DIAGNOSIS — I509 Heart failure, unspecified: Secondary | ICD-10-CM | POA: Diagnosis not present

## 2023-01-31 ENCOUNTER — Other Ambulatory Visit: Payer: Self-pay | Admitting: Cardiology

## 2023-01-31 DIAGNOSIS — I482 Chronic atrial fibrillation, unspecified: Secondary | ICD-10-CM | POA: Diagnosis not present

## 2023-01-31 DIAGNOSIS — F32A Depression, unspecified: Secondary | ICD-10-CM | POA: Diagnosis not present

## 2023-01-31 DIAGNOSIS — I10 Essential (primary) hypertension: Secondary | ICD-10-CM | POA: Diagnosis not present

## 2023-01-31 DIAGNOSIS — G311 Senile degeneration of brain, not elsewhere classified: Secondary | ICD-10-CM | POA: Diagnosis not present

## 2023-01-31 DIAGNOSIS — E1122 Type 2 diabetes mellitus with diabetic chronic kidney disease: Secondary | ICD-10-CM | POA: Diagnosis not present

## 2023-01-31 DIAGNOSIS — N185 Chronic kidney disease, stage 5: Secondary | ICD-10-CM | POA: Diagnosis not present

## 2023-02-01 DIAGNOSIS — I10 Essential (primary) hypertension: Secondary | ICD-10-CM | POA: Diagnosis not present

## 2023-02-01 DIAGNOSIS — N185 Chronic kidney disease, stage 5: Secondary | ICD-10-CM | POA: Diagnosis not present

## 2023-02-01 DIAGNOSIS — E1122 Type 2 diabetes mellitus with diabetic chronic kidney disease: Secondary | ICD-10-CM | POA: Diagnosis not present

## 2023-02-01 DIAGNOSIS — I482 Chronic atrial fibrillation, unspecified: Secondary | ICD-10-CM | POA: Diagnosis not present

## 2023-02-01 DIAGNOSIS — F32A Depression, unspecified: Secondary | ICD-10-CM | POA: Diagnosis not present

## 2023-02-01 DIAGNOSIS — E119 Type 2 diabetes mellitus without complications: Secondary | ICD-10-CM | POA: Diagnosis not present

## 2023-02-01 DIAGNOSIS — G311 Senile degeneration of brain, not elsewhere classified: Secondary | ICD-10-CM | POA: Diagnosis not present

## 2023-02-04 DIAGNOSIS — F32A Depression, unspecified: Secondary | ICD-10-CM | POA: Diagnosis not present

## 2023-02-04 DIAGNOSIS — I482 Chronic atrial fibrillation, unspecified: Secondary | ICD-10-CM | POA: Diagnosis not present

## 2023-02-04 DIAGNOSIS — I10 Essential (primary) hypertension: Secondary | ICD-10-CM | POA: Diagnosis not present

## 2023-02-04 DIAGNOSIS — E1122 Type 2 diabetes mellitus with diabetic chronic kidney disease: Secondary | ICD-10-CM | POA: Diagnosis not present

## 2023-02-04 DIAGNOSIS — G311 Senile degeneration of brain, not elsewhere classified: Secondary | ICD-10-CM | POA: Diagnosis not present

## 2023-02-04 DIAGNOSIS — N185 Chronic kidney disease, stage 5: Secondary | ICD-10-CM | POA: Diagnosis not present

## 2023-02-08 DIAGNOSIS — I10 Essential (primary) hypertension: Secondary | ICD-10-CM | POA: Diagnosis not present

## 2023-02-08 DIAGNOSIS — G311 Senile degeneration of brain, not elsewhere classified: Secondary | ICD-10-CM | POA: Diagnosis not present

## 2023-02-08 DIAGNOSIS — F32A Depression, unspecified: Secondary | ICD-10-CM | POA: Diagnosis not present

## 2023-02-08 DIAGNOSIS — I482 Chronic atrial fibrillation, unspecified: Secondary | ICD-10-CM | POA: Diagnosis not present

## 2023-02-08 DIAGNOSIS — E1122 Type 2 diabetes mellitus with diabetic chronic kidney disease: Secondary | ICD-10-CM | POA: Diagnosis not present

## 2023-02-08 DIAGNOSIS — N185 Chronic kidney disease, stage 5: Secondary | ICD-10-CM | POA: Diagnosis not present

## 2023-02-11 DIAGNOSIS — G311 Senile degeneration of brain, not elsewhere classified: Secondary | ICD-10-CM | POA: Diagnosis not present

## 2023-02-11 DIAGNOSIS — I10 Essential (primary) hypertension: Secondary | ICD-10-CM | POA: Diagnosis not present

## 2023-02-11 DIAGNOSIS — E1122 Type 2 diabetes mellitus with diabetic chronic kidney disease: Secondary | ICD-10-CM | POA: Diagnosis not present

## 2023-02-11 DIAGNOSIS — N185 Chronic kidney disease, stage 5: Secondary | ICD-10-CM | POA: Diagnosis not present

## 2023-02-11 DIAGNOSIS — I482 Chronic atrial fibrillation, unspecified: Secondary | ICD-10-CM | POA: Diagnosis not present

## 2023-02-11 DIAGNOSIS — F32A Depression, unspecified: Secondary | ICD-10-CM | POA: Diagnosis not present

## 2023-02-15 DIAGNOSIS — N185 Chronic kidney disease, stage 5: Secondary | ICD-10-CM | POA: Diagnosis not present

## 2023-02-15 DIAGNOSIS — G311 Senile degeneration of brain, not elsewhere classified: Secondary | ICD-10-CM | POA: Diagnosis not present

## 2023-02-15 DIAGNOSIS — I482 Chronic atrial fibrillation, unspecified: Secondary | ICD-10-CM | POA: Diagnosis not present

## 2023-02-15 DIAGNOSIS — F32A Depression, unspecified: Secondary | ICD-10-CM | POA: Diagnosis not present

## 2023-02-15 DIAGNOSIS — I10 Essential (primary) hypertension: Secondary | ICD-10-CM | POA: Diagnosis not present

## 2023-02-15 DIAGNOSIS — E1122 Type 2 diabetes mellitus with diabetic chronic kidney disease: Secondary | ICD-10-CM | POA: Diagnosis not present

## 2023-02-16 DIAGNOSIS — I482 Chronic atrial fibrillation, unspecified: Secondary | ICD-10-CM | POA: Diagnosis not present

## 2023-02-16 DIAGNOSIS — I509 Heart failure, unspecified: Secondary | ICD-10-CM | POA: Diagnosis not present

## 2023-02-16 DIAGNOSIS — N185 Chronic kidney disease, stage 5: Secondary | ICD-10-CM | POA: Diagnosis not present

## 2023-02-16 DIAGNOSIS — E785 Hyperlipidemia, unspecified: Secondary | ICD-10-CM | POA: Diagnosis not present

## 2023-02-16 DIAGNOSIS — E1122 Type 2 diabetes mellitus with diabetic chronic kidney disease: Secondary | ICD-10-CM | POA: Diagnosis not present

## 2023-02-16 DIAGNOSIS — F32A Depression, unspecified: Secondary | ICD-10-CM | POA: Diagnosis not present

## 2023-02-16 DIAGNOSIS — J439 Emphysema, unspecified: Secondary | ICD-10-CM | POA: Diagnosis not present

## 2023-02-16 DIAGNOSIS — G311 Senile degeneration of brain, not elsewhere classified: Secondary | ICD-10-CM | POA: Diagnosis not present

## 2023-02-16 DIAGNOSIS — I10 Essential (primary) hypertension: Secondary | ICD-10-CM | POA: Diagnosis not present

## 2023-02-18 DIAGNOSIS — G311 Senile degeneration of brain, not elsewhere classified: Secondary | ICD-10-CM | POA: Diagnosis not present

## 2023-02-18 DIAGNOSIS — E1122 Type 2 diabetes mellitus with diabetic chronic kidney disease: Secondary | ICD-10-CM | POA: Diagnosis not present

## 2023-02-18 DIAGNOSIS — I482 Chronic atrial fibrillation, unspecified: Secondary | ICD-10-CM | POA: Diagnosis not present

## 2023-02-18 DIAGNOSIS — I10 Essential (primary) hypertension: Secondary | ICD-10-CM | POA: Diagnosis not present

## 2023-02-18 DIAGNOSIS — F32A Depression, unspecified: Secondary | ICD-10-CM | POA: Diagnosis not present

## 2023-02-18 DIAGNOSIS — N185 Chronic kidney disease, stage 5: Secondary | ICD-10-CM | POA: Diagnosis not present

## 2023-02-19 ENCOUNTER — Telehealth: Payer: Self-pay | Admitting: Emergency Medicine

## 2023-02-20 NOTE — Telephone Encounter (Signed)
Noted     Closing encounter

## 2023-02-22 DIAGNOSIS — I10 Essential (primary) hypertension: Secondary | ICD-10-CM | POA: Diagnosis not present

## 2023-02-22 DIAGNOSIS — N185 Chronic kidney disease, stage 5: Secondary | ICD-10-CM | POA: Diagnosis not present

## 2023-02-22 DIAGNOSIS — E1122 Type 2 diabetes mellitus with diabetic chronic kidney disease: Secondary | ICD-10-CM | POA: Diagnosis not present

## 2023-02-22 DIAGNOSIS — F32A Depression, unspecified: Secondary | ICD-10-CM | POA: Diagnosis not present

## 2023-02-22 DIAGNOSIS — G311 Senile degeneration of brain, not elsewhere classified: Secondary | ICD-10-CM | POA: Diagnosis not present

## 2023-02-22 DIAGNOSIS — I482 Chronic atrial fibrillation, unspecified: Secondary | ICD-10-CM | POA: Diagnosis not present

## 2023-02-23 DIAGNOSIS — I10 Essential (primary) hypertension: Secondary | ICD-10-CM | POA: Diagnosis not present

## 2023-02-23 DIAGNOSIS — I482 Chronic atrial fibrillation, unspecified: Secondary | ICD-10-CM | POA: Diagnosis not present

## 2023-02-23 DIAGNOSIS — E1122 Type 2 diabetes mellitus with diabetic chronic kidney disease: Secondary | ICD-10-CM | POA: Diagnosis not present

## 2023-02-23 DIAGNOSIS — F32A Depression, unspecified: Secondary | ICD-10-CM | POA: Diagnosis not present

## 2023-02-23 DIAGNOSIS — N185 Chronic kidney disease, stage 5: Secondary | ICD-10-CM | POA: Diagnosis not present

## 2023-02-23 DIAGNOSIS — G311 Senile degeneration of brain, not elsewhere classified: Secondary | ICD-10-CM | POA: Diagnosis not present

## 2023-02-24 DIAGNOSIS — E1122 Type 2 diabetes mellitus with diabetic chronic kidney disease: Secondary | ICD-10-CM | POA: Diagnosis not present

## 2023-02-24 DIAGNOSIS — G311 Senile degeneration of brain, not elsewhere classified: Secondary | ICD-10-CM | POA: Diagnosis not present

## 2023-02-24 DIAGNOSIS — I10 Essential (primary) hypertension: Secondary | ICD-10-CM | POA: Diagnosis not present

## 2023-02-24 DIAGNOSIS — F32A Depression, unspecified: Secondary | ICD-10-CM | POA: Diagnosis not present

## 2023-02-24 DIAGNOSIS — I482 Chronic atrial fibrillation, unspecified: Secondary | ICD-10-CM | POA: Diagnosis not present

## 2023-02-24 DIAGNOSIS — N185 Chronic kidney disease, stage 5: Secondary | ICD-10-CM | POA: Diagnosis not present

## 2023-02-25 DIAGNOSIS — G311 Senile degeneration of brain, not elsewhere classified: Secondary | ICD-10-CM | POA: Diagnosis not present

## 2023-02-25 DIAGNOSIS — F32A Depression, unspecified: Secondary | ICD-10-CM | POA: Diagnosis not present

## 2023-02-25 DIAGNOSIS — I10 Essential (primary) hypertension: Secondary | ICD-10-CM | POA: Diagnosis not present

## 2023-02-25 DIAGNOSIS — N185 Chronic kidney disease, stage 5: Secondary | ICD-10-CM | POA: Diagnosis not present

## 2023-02-25 DIAGNOSIS — E1122 Type 2 diabetes mellitus with diabetic chronic kidney disease: Secondary | ICD-10-CM | POA: Diagnosis not present

## 2023-02-25 DIAGNOSIS — I482 Chronic atrial fibrillation, unspecified: Secondary | ICD-10-CM | POA: Diagnosis not present

## 2023-02-26 DIAGNOSIS — I482 Chronic atrial fibrillation, unspecified: Secondary | ICD-10-CM | POA: Diagnosis not present

## 2023-02-26 DIAGNOSIS — E1122 Type 2 diabetes mellitus with diabetic chronic kidney disease: Secondary | ICD-10-CM | POA: Diagnosis not present

## 2023-02-26 DIAGNOSIS — F32A Depression, unspecified: Secondary | ICD-10-CM | POA: Diagnosis not present

## 2023-02-26 DIAGNOSIS — G311 Senile degeneration of brain, not elsewhere classified: Secondary | ICD-10-CM | POA: Diagnosis not present

## 2023-02-26 DIAGNOSIS — I10 Essential (primary) hypertension: Secondary | ICD-10-CM | POA: Diagnosis not present

## 2023-02-26 DIAGNOSIS — N185 Chronic kidney disease, stage 5: Secondary | ICD-10-CM | POA: Diagnosis not present

## 2023-03-18 DEATH — deceased
